# Patient Record
Sex: Male | Born: 1960 | Race: White | Hispanic: No | Marital: Married | Smoking: Former smoker
Health system: Southern US, Community
[De-identification: ages and names within clinical notes are randomized; demographics above are authoritative.]

## PROBLEM LIST (undated history)

## (undated) DIAGNOSIS — C679 Malignant neoplasm of bladder, unspecified: Secondary | ICD-10-CM

## (undated) DIAGNOSIS — I2510 Atherosclerotic heart disease of native coronary artery without angina pectoris: Secondary | ICD-10-CM

## (undated) DIAGNOSIS — R011 Cardiac murmur, unspecified: Secondary | ICD-10-CM

## (undated) DIAGNOSIS — B019 Varicella without complication: Secondary | ICD-10-CM

## (undated) DIAGNOSIS — E669 Obesity, unspecified: Secondary | ICD-10-CM

## (undated) DIAGNOSIS — R7303 Prediabetes: Secondary | ICD-10-CM

## (undated) DIAGNOSIS — G4733 Obstructive sleep apnea (adult) (pediatric): Secondary | ICD-10-CM

## (undated) DIAGNOSIS — G4730 Sleep apnea, unspecified: Secondary | ICD-10-CM

## (undated) DIAGNOSIS — I10 Essential (primary) hypertension: Secondary | ICD-10-CM

## (undated) DIAGNOSIS — I509 Heart failure, unspecified: Secondary | ICD-10-CM

## (undated) DIAGNOSIS — T7840XA Allergy, unspecified, initial encounter: Secondary | ICD-10-CM

## (undated) DIAGNOSIS — C801 Malignant (primary) neoplasm, unspecified: Secondary | ICD-10-CM

## (undated) DIAGNOSIS — M1990 Unspecified osteoarthritis, unspecified site: Secondary | ICD-10-CM

## (undated) DIAGNOSIS — I252 Old myocardial infarction: Secondary | ICD-10-CM

## (undated) DIAGNOSIS — E785 Hyperlipidemia, unspecified: Secondary | ICD-10-CM

## (undated) HISTORY — DX: Obesity, unspecified: E66.9

## (undated) HISTORY — DX: Atherosclerotic heart disease of native coronary artery without angina pectoris: I25.10

## (undated) HISTORY — DX: Heart failure, unspecified: I50.9

## (undated) HISTORY — PX: COLONOSCOPY: SHX174

## (undated) HISTORY — DX: Malignant neoplasm of bladder, unspecified: C67.9

## (undated) HISTORY — DX: Allergy, unspecified, initial encounter: T78.40XA

## (undated) HISTORY — DX: Old myocardial infarction: I25.2

## (undated) HISTORY — DX: Varicella without complication: B01.9

## (undated) HISTORY — PX: NASAL SINUS SURGERY: SHX719

## (undated) HISTORY — DX: Hyperlipidemia, unspecified: E78.5

## (undated) HISTORY — DX: Sleep apnea, unspecified: G47.30

## (undated) HISTORY — DX: Unspecified osteoarthritis, unspecified site: M19.90

## (undated) HISTORY — DX: Cardiac murmur, unspecified: R01.1

## (undated) HISTORY — PX: VASECTOMY: SHX75

## (undated) HISTORY — DX: Obstructive sleep apnea (adult) (pediatric): G47.33

---

## 1998-07-14 HISTORY — PX: KNEE SURGERY: SHX244

## 2012-07-14 HISTORY — PX: EYE SURGERY: SHX253

## 2013-07-14 HISTORY — PX: CARDIAC CATHETERIZATION: SHX172

## 2014-01-25 DIAGNOSIS — I2510 Atherosclerotic heart disease of native coronary artery without angina pectoris: Secondary | ICD-10-CM

## 2014-01-25 HISTORY — PX: CORONARY ANGIOPLASTY: SHX604

## 2014-01-25 HISTORY — DX: Atherosclerotic heart disease of native coronary artery without angina pectoris: I25.10

## 2014-03-13 ENCOUNTER — Encounter: Payer: Self-pay | Admitting: Cardiology

## 2014-03-21 ENCOUNTER — Encounter: Payer: Self-pay | Admitting: Cardiology

## 2015-10-19 ENCOUNTER — Ambulatory Visit (INDEPENDENT_AMBULATORY_CARE_PROVIDER_SITE_OTHER): Payer: 59 | Admitting: Cardiology

## 2015-10-19 ENCOUNTER — Encounter: Payer: Self-pay | Admitting: Cardiology

## 2015-10-19 VITALS — BP 126/64 | Ht 71.0 in | Wt 264.0 lb

## 2015-10-19 DIAGNOSIS — E785 Hyperlipidemia, unspecified: Secondary | ICD-10-CM | POA: Diagnosis not present

## 2015-10-19 DIAGNOSIS — I2510 Atherosclerotic heart disease of native coronary artery without angina pectoris: Secondary | ICD-10-CM

## 2015-10-19 DIAGNOSIS — G4733 Obstructive sleep apnea (adult) (pediatric): Secondary | ICD-10-CM | POA: Diagnosis not present

## 2015-10-19 DIAGNOSIS — I10 Essential (primary) hypertension: Secondary | ICD-10-CM | POA: Diagnosis not present

## 2015-10-19 DIAGNOSIS — E669 Obesity, unspecified: Secondary | ICD-10-CM

## 2015-10-19 HISTORY — DX: Obesity, unspecified: E66.9

## 2015-10-19 HISTORY — DX: Hyperlipidemia, unspecified: E78.5

## 2015-10-19 HISTORY — DX: Obstructive sleep apnea (adult) (pediatric): G47.33

## 2015-10-19 MED ORDER — TICAGRELOR 60 MG PO TABS: 60.0000 mg | ORAL_TABLET | Freq: Two times a day (BID) | ORAL | Status: DC

## 2015-10-19 NOTE — Patient Instructions (Signed)
Medication Instructions:  1) DECREASE your Brilinta to 60mg  twice daily  Labwork: Your physician recommends that you return for lab work in: 1 week (Fasting lipid, liver and bmet)   Testing/Procedures: None  Follow-Up: Your physician wants you to follow-up in: 1 year with Dr. Radford Pax. You will receive a reminder letter in the mail two months in advance. If you don't receive a letter, please call our office to schedule the follow-up appointment.   Any Other Special Instructions Will Be Listed Below (If Applicable).     If you need a refill on your cardiac medications before your next appointment, please call your pharmacy.

## 2015-10-19 NOTE — Progress Notes (Addendum)
Cardiology Office Note    Date:  10/26/2015   ID:  Melburn Hake, DOB February 17, 1961, MRN [id number]  PCP:  No PCP Per Patient  Cardiologist:  Sueanne Margarita, MD   Chief Complaint  Patient presents with  . Coronary Artery Disease  . Hypertension  . Hyperlipidemia    History of Present Illness:  Peter Oconnor is a 54 y.o. male with a history of ASCAD with anterior MI in 01/2014.  Cath revealed 30-40% RCA, occluded LCX, 90% LAD s/p LAD and LCx.  EF was 50% with inferior HK. Echo 01/2014 showed EF 60-65% with LVH, mid TR and mid to moderate AVSC and lateral wall infarct.   Stress echo 06/2014 showed normal LVF with EF 60-65% with no ischemia.  He also has a history of tobacco abuse, HTN, obesity, dyslipidemia and OSA.  He moved to [place] from [place] last May.  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  He denies any chest pain, SOB, DOE (except with running up stairs), LE edema, PND, orthopnea, palpitations or syncope.  He occasionally has some dizziness with balance issues.  He is on CPAP which he tolerates well.  He has been on it since 2010.  He tolerates his device and feels the pressure is adequate.  He uses a full face mask which he tolerates well.  He feels rested in the am and has no daytime sleepiness.      Past Medical History  Diagnosis Date  . MI, old   . Benign essential HTN 10/19/2015  . OSA (obstructive sleep apnea) 10/19/2015  . Obesity (BMI 30-39.9) 10/19/2015  . CAD (coronary artery disease), native coronary artery 01/25/2014    S/p anterior MI with 90% LAD and occluded LCx s/p PCI of LAD and LCx with residual 30-40% stenosis of the RCA and normal LVF with lateral AK.    Marland Kitchen Hyperlipidemia with target LDL less than 70 10/19/2015    Past Surgical History  Procedure Laterality Date  . Cardiac catheterization    . Coronary angioplasty  01/25/2014    PCI of LCx and LAD    Current  Medications: Outpatient Prescriptions Prior to Visit  Medication Sig Dispense Refill  . aspirin 81 MG tablet Take 81 mg by mouth daily.    Marland Kitchen lisinopril (PRINIVIL,ZESTRIL) 5 MG tablet Take 5 mg by mouth daily.    . metoprolol tartrate (LOPRESSOR) 25 MG tablet Take 25 mg by mouth 2 (two) times daily.    . rosuvastatin (CRESTOR) 40 MG tablet Take 40 mg by mouth daily.    . ticagrelor (BRILINTA) 90 MG TABS tablet Take by mouth 2 (two) times daily.     No facility-administered medications prior to visit.     Allergies:   Penicillins   Social History   Social History  . Marital Status: Married    Spouse Name: N/A  . Number of Children: N/A  . Years of Education: N/A   Social History Main Topics  . Smoking status: Former Research scientist (life sciences)  . Smokeless tobacco: None  . Alcohol Use: Yes  . Drug Use: No  . Sexual Activity: Not Asked   Other Topics Concern  . None   Social History Narrative     Family History:  The patient's family history includes Heart attack in his maternal grandfather; Heart disease in his maternal grandfather; Leukemia in his father.  ROS:   Please see the history of present illness.    ROS All other systems reviewed and are negative.   PHYSICAL EXAM:   VS:  BP 126/64 mmHg  Ht 5\' 11"  (1.803 m)  Wt 264 lb (119.75 kg)  BMI 36.84 kg/m2   GEN: Well nourished, well developed, in no acute distress HEENT: normal Neck: no JVD, carotid bruits, or masses Cardiac: RRR; no murmurs, rubs, or gallops,no edema.  Intact distal pulses bilaterally.  Respiratory:  clear to auscultation bilaterally, normal work of breathing GI: soft, nontender, nondistended, + BS MS: no deformity or atrophy Skin: warm and dry, no rash Neuro:  Alert and Oriented x 3, Strength and sensation are intact Psych: euthymic mood, full affect  Wt Readings from Last 3 Encounters:  10/19/15 264 lb (119.75 kg)      Studies/Labs Reviewed:   EKG:  EKG is ordered today.  The ekg ordered today  demonstrates sinus bradycardia at 57 bpm with no ST changes.  Recent Labs: 10/25/2015: ALT 31; BUN 13; Creat 0.97; Potassium 4.2; Sodium 140   Lipid Panel    Component Value Date/Time   CHOL 128 10/25/2015 0731   TRIG 119 10/25/2015 0731   HDL 41 10/25/2015 0731   CHOLHDL 3.1 10/25/2015 0731   VLDL 24 10/25/2015 0731   LDLCALC 63 10/25/2015 0731    Additional studies/ records that were reviewed today include:  Office notes from PCP and prior cardiologist, Cath report, echo and stress echo reports.    ASSESSMENT:    1. Coronary artery disease involving native coronary artery of native heart without angina pectoris   2. Hyperlipidemia with target LDL less than 70   3. Benign essential HTN   4. OSA (obstructive sleep apnea)   5. Obesity (BMI 30-39.9)      PLAN:  In order of problems listed above:  1. ASCAD with remote anterior MI and PCI of the LCx and LAD 01/2014.  He has no anginal symptoms.  Continue ASA/BB/statin.  Decrease Brilinta to 60mg  BID. 2. Hyperlipidemia - continue statin.  Check FLP and ALT. 3. HTN with controlled BP - continue BB and ACE I 4. OSA on CPAP and tolerating well.  Continue CPAP and I will set him up with [facility] and get a download.   5. Obesity - he is quiting his job and plans on getting into an exercise routine.    Followup with me in 1 year    Medication Adjustments/Labs and Tests Ordered: Current medicines are reviewed at length with the patient today.  Concerns regarding medicines are outlined above.  Medication changes, Labs and Tests ordered today are listed in the Patient Instructions below. Patient Instructions  Medication Instructions:  1) DECREASE your Brilinta to 60mg  twice daily  Labwork: Your physician recommends that you return for lab work in: 1 week (Fasting lipid, liver and bmet)   Testing/Procedures: None  Follow-Up: Your physician wants you to follow-up in: 1 year with Dr. Radford Pax. You will receive a reminder letter in the  mail two months in advance. If you don't receive a letter, please call our office to schedule the follow-up appointment.   Any Other Special Instructions Will Be Listed Below (If Applicable).     If you need a refill on your cardiac medications before your next appointment, please call your pharmacy.       Lurena Nida, MD  10/26/2015 1:16 PM    [place] [place], [place]  [address] Phone: [phone]; Fax: [phone]

## 2015-10-25 ENCOUNTER — Other Ambulatory Visit (INDEPENDENT_AMBULATORY_CARE_PROVIDER_SITE_OTHER): Payer: 59 | Admitting: *Deleted

## 2015-10-25 ENCOUNTER — Other Ambulatory Visit: Payer: Self-pay | Admitting: *Deleted

## 2015-10-25 DIAGNOSIS — E785 Hyperlipidemia, unspecified: Secondary | ICD-10-CM

## 2015-10-25 DIAGNOSIS — G4733 Obstructive sleep apnea (adult) (pediatric): Secondary | ICD-10-CM

## 2015-10-25 DIAGNOSIS — I2510 Atherosclerotic heart disease of native coronary artery without angina pectoris: Secondary | ICD-10-CM | POA: Diagnosis not present

## 2015-10-25 DIAGNOSIS — E669 Obesity, unspecified: Secondary | ICD-10-CM

## 2015-10-25 LAB — LIPID PANEL
CHOL/HDL RATIO: 3.1 ratio (ref <=5.0)
CHOLESTEROL: 128 mg/dL (ref 125–200)
HDL: 41 mg/dL (ref >=40)
LDL Cholesterol: 63 mg/dL (ref <130)
Triglycerides: 119 mg/dL (ref <150)
VLDL: 24 mg/dL (ref <30)

## 2015-10-25 LAB — HEPATIC FUNCTION PANEL
ALBUMIN: 4.5 g/dL (ref 3.6–5.1)
ALK PHOS: 39 U/L — AB (ref 40–115)
ALT: 31 U/L (ref 9–46)
AST: 23 U/L (ref 10–35)
BILIRUBIN TOTAL: 0.9 mg/dL (ref 0.2–1.2)
Bilirubin, Direct: 0.2 mg/dL (ref <=0.2)
Indirect Bilirubin: 0.7 mg/dL (ref 0.2–1.2)
TOTAL PROTEIN: 7 g/dL (ref 6.1–8.1)

## 2015-10-25 LAB — BASIC METABOLIC PANEL
BUN: 13 mg/dL (ref 7–25)
CALCIUM: 9.4 mg/dL (ref 8.6–10.3)
CO2: 24 mmol/L (ref 20–31)
CREATININE: 0.97 mg/dL (ref 0.70–1.33)
Chloride: 107 mmol/L (ref 98–110)
GLUCOSE: 102 mg/dL — AB (ref 65–99)
Potassium: 4.2 mmol/L (ref 3.5–5.3)
SODIUM: 140 mmol/L (ref 135–146)

## 2015-10-26 ENCOUNTER — Encounter: Payer: Self-pay | Admitting: Cardiology

## 2015-10-26 ENCOUNTER — Telehealth: Payer: Self-pay | Admitting: *Deleted

## 2015-10-26 MED ORDER — LISINOPRIL 5 MG PO TABS: 5.0000 mg | ORAL_TABLET | Freq: Every day | ORAL | Status: DC

## 2015-10-26 MED ORDER — METOPROLOL TARTRATE 25 MG PO TABS: 25.0000 mg | ORAL_TABLET | Freq: Two times a day (BID) | ORAL | Status: DC

## 2015-10-26 NOTE — Telephone Encounter (Signed)
Advised patient and new Rx's sent to [facility]

## 2015-10-26 NOTE — Telephone Encounter (Signed)
Patient stated that for the last 7-9 months he has been taking his Metoprolol Tart 25 mg once a day and his Lisinopril 5 mg twice a day because that is what his bottles say. Patient would like Dr Radford Pax to give recommendations on what she thinks he should be taking Will forward to her for review   Advised patient of lab results

## 2015-10-26 NOTE — Telephone Encounter (Signed)
-----   Message from Sueanne Margarita, MD sent at 10/25/2015 10:30 PM EDT ----- Please let patient know that labs were normal.  Continue current medical therapy.

## 2015-10-26 NOTE — Telephone Encounter (Signed)
Please have him verify what the bottles say - he should be on metoprolol BID and ACE I daily

## 2016-01-18 ENCOUNTER — Other Ambulatory Visit: Payer: Self-pay | Admitting: *Deleted

## 2016-01-18 MED ORDER — ROSUVASTATIN CALCIUM 40 MG PO TABS: 40.0000 mg | ORAL_TABLET | Freq: Every day | ORAL | Status: DC

## 2016-04-01 ENCOUNTER — Encounter: Payer: Self-pay | Admitting: Cardiology

## 2016-04-14 ENCOUNTER — Encounter: Payer: Self-pay | Admitting: Cardiology

## 2016-04-21 NOTE — Telephone Encounter (Signed)
Follow up  Pt c/o medication issue:  1. Name of Medication: Crestor/Rosuvastatin  2. How are you currently taking this medication (dosage and times per day)? 40 mg tablet once daily  3. Are you having a reaction (difficulty breathing--STAT)? No  4. What is your medication issue? Pt voiced wanting to know if he needs generic/brand medication.   Pt voiced wanting generic due to cost of medication.   Pt voiced message was also sent via mychart msg but no response.   Pt voiced he receives a 90 day supply and wants to make sure generic is listed for reorder instead of brand medication.  Pt voiced waiting for someone to call [facility] for generic prescription that was okayed by MD-Turner  Please f/u with pt.

## 2016-04-22 ENCOUNTER — Other Ambulatory Visit: Payer: Self-pay | Admitting: *Deleted

## 2016-04-22 MED ORDER — ROSUVASTATIN CALCIUM 40 MG PO TABS: 40.0000 mg | ORAL_TABLET | Freq: Every day | ORAL | 1 refills | Status: DC

## 2016-09-19 ENCOUNTER — Encounter: Payer: Self-pay | Admitting: Cardiology

## 2016-10-08 ENCOUNTER — Ambulatory Visit (INDEPENDENT_AMBULATORY_CARE_PROVIDER_SITE_OTHER): Payer: 59 | Admitting: Cardiology

## 2016-10-08 ENCOUNTER — Encounter: Payer: Self-pay | Admitting: Cardiology

## 2016-10-08 VITALS — BP 122/60 | HR 61 | Ht 71.0 in | Wt 266.6 lb

## 2016-10-08 DIAGNOSIS — I2510 Atherosclerotic heart disease of native coronary artery without angina pectoris: Secondary | ICD-10-CM

## 2016-10-08 DIAGNOSIS — E785 Hyperlipidemia, unspecified: Secondary | ICD-10-CM

## 2016-10-08 MED ORDER — LISINOPRIL 5 MG PO TABS: 5.0000 mg | ORAL_TABLET | Freq: Every day | ORAL | 3 refills | Status: DC

## 2016-10-08 MED ORDER — TICAGRELOR 60 MG PO TABS: 60.0000 mg | ORAL_TABLET | Freq: Two times a day (BID) | ORAL | 3 refills | Status: DC

## 2016-10-08 MED ORDER — NITROGLYCERIN 0.4 MG SL SUBL: 0.4000 mg | SUBLINGUAL_TABLET | SUBLINGUAL | 3 refills | Status: DC | PRN

## 2016-10-08 MED ORDER — ROSUVASTATIN CALCIUM 40 MG PO TABS: 40.0000 mg | ORAL_TABLET | Freq: Every day | ORAL | 3 refills | Status: DC

## 2016-10-08 MED ORDER — METOPROLOL TARTRATE 25 MG PO TABS
25.0000 mg | ORAL_TABLET | Freq: Two times a day (BID) | ORAL | 3 refills | Status: DC
Start: 2016-10-08 — End: 2017-10-12

## 2016-10-08 NOTE — Progress Notes (Signed)
10/08/2016 Peter Oconnor   [birth year]  [id number]  Primary Physician No PCP Per Patient Primary Cardiologist: Dr. Radford Pax    Reason for Visit/CC: F/u for CAD  HPI:  The patient is a 56 y/o male, followed by Dr. Radford Pax, with a h/o CAD s/p anterior MI in 01/2014 while living in [place].  Cath revealed 30-40% RCA, occluded LCX, and a 90% LAD. He underwent PCI + stenting of his LCx and LAD.  Echo 01/2014 showed EF 60-65% with LVH, mid TR and mild to moderate AVSC and lateral wall infarct.  He also has a h/o tobacco abuse, HTN, obesity and OSA.   He presents to clinic for 1 year f/u. He has done well since his last OV. He retired from his job, and reports that he is less stressed. He is more physically active. He walks ~5 miles a day. Also exercises on the elliptical. He denies anginal symptoms. No exertional chest pain or dyspnea. He reports full medication compliance. He quit smoking cigars. No other tobacco use.    Current Meds  Medication Sig  . aspirin 81 MG tablet Take 81 mg by mouth daily.  . Coenzyme Q10 (CO Q-10) 200 MG CAPS Take 1 capsule by mouth daily.  Marland Kitchen lisinopril (PRINIVIL,ZESTRIL) 5 MG tablet Take 1 tablet (5 mg total) by mouth daily.  . metoprolol tartrate (LOPRESSOR) 25 MG tablet Take 1 tablet (25 mg total) by mouth 2 (two) times daily.  . rosuvastatin (CRESTOR) 40 MG tablet Take 1 tablet (40 mg total) by mouth daily.  . ticagrelor (BRILINTA) 60 MG TABS tablet Take 1 tablet (60 mg total) by mouth 2 (two) times daily.  . [DISCONTINUED] lisinopril (PRINIVIL,ZESTRIL) 5 MG tablet Take 1 tablet (5 mg total) by mouth daily.  . [DISCONTINUED] metoprolol tartrate (LOPRESSOR) 25 MG tablet Take 1 tablet (25 mg total) by mouth 2 (two) times daily.  . [DISCONTINUED] rosuvastatin (CRESTOR) 40 MG tablet Take 1 tablet (40 mg total) by mouth daily.  . [DISCONTINUED] ticagrelor (BRILINTA) 60 MG TABS tablet Take 1 tablet (60 mg total) by mouth 2 (two) times daily.   Allergies  Allergen  Reactions  . Penicillins Rash    Also itching   Past Medical History:  Diagnosis Date  . Benign essential HTN 10/19/2015  . 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    Marland Kitchen Hyperlipidemia with target LDL less than 70 10/19/2015  . MI, old   . Obesity (BMI 30-39.9) 10/19/2015  . OSA (obstructive sleep apnea) 10/19/2015   Family History  Problem Relation Age of Onset  . Leukemia Father   . Heart attack Maternal Grandfather   . Heart disease Maternal Grandfather    Past Surgical History:  Procedure Laterality Date  . CARDIAC CATHETERIZATION    . CORONARY ANGIOPLASTY  01/25/2014   PCI of LCx and LAD   Social History   Social History  . Marital status: Married    Spouse name: N/A  . Number of children: N/A  . Years of education: N/A   Occupational History  . Not on file.   Social History Main Topics  . Smoking status: Former Research scientist (life sciences)  . Smokeless tobacco: Never Used  . Alcohol use Yes  . Drug use: No  . Sexual activity: Not on file   Other Topics Concern  . Not  on file   Social History Narrative  . No narrative on file     Review of Systems: General: negative for chills, fever, night sweats or weight changes.  Cardiovascular: negative for chest pain, dyspnea on exertion, edema, orthopnea, palpitations, paroxysmal nocturnal dyspnea or shortness of breath Dermatological: negative for rash Respiratory: negative for cough or wheezing Urologic: negative for hematuria Abdominal: negative for nausea, vomiting, diarrhea, bright red blood per rectum, melena, or hematemesis Neurologic: negative for visual changes, syncope, or dizziness All other systems reviewed and are otherwise negative except as noted above.   Physical Exam:  Blood pressure 122/60, pulse 61, height 5\' 11"  (1.803 m), weight 266 lb 9.6 oz (120.9 kg).    General appearance: alert, cooperative, no distress and moderately obese Neck: no carotid bruit and no JVD Lungs: clear to auscultation bilaterally Heart: regular rate and rhythm, S1, S2 normal, no murmur, click, rub or gallop Extremities: extremities normal, atraumatic, no cyanosis or edema Pulses: 2+ and symmetric Skin: Skin color, texture, turgor normal. No rashes or lesions Neurologic: Grossly normal  EKG NSR, slight TWI in inferior leads III and AVF  ASSESSMENT AND PLAN:   1. CAD: s/p MI in [place] 01/2004, s/p PCI + stenting to LCx and LAD. 30-40% RCA stenosis noted at that time. He has remained stable w/o recurrent angina. He exercises regularly, walks ~5 miles a day w/o exertional CP or dyspnea. Continue medical therapy for secondary prevention. Continue ASA, Brilinta (60 mg BID), Crestor, metoprolol and lisinopril.   2. HTN: BP is well controlled on current regimen. Check CMP to monitor renal function and K.   3. HLD: last lipid panel 1 year ago showed well controlled LDL at 63 mg/dL. We will repeat a FLP and CMP to check liver enzymes.   4. OSA: he reports full compliance with CPAP nightly  PLAN  Continue current regimen. f/u in 1 year with Dr. Radford Pax or sooner if any cardiac symptoms.   Brittainy Simmons PA-C 10/08/2016 4:00 PM

## 2016-10-08 NOTE — Patient Instructions (Addendum)
Medication Instructions:   Your physician recommends that you continue on your current medications as directed. Please refer to the Current Medication list given to you today.   If you need a refill on your cardiac medications before your next appointment, please call your pharmacy.  Labwork:  FASTING CMET AND LIPID TOMORROW    Testing/Procedures: NONE ORDERED  TODAY'    Follow-Up: Your physician wants you to follow-up in: [place] will receive a reminder letter in the mail two months in advance. If you don't receive a letter, please call our office to schedule the follow-up appointment.     Any Other Special Instructions Will Be Listed Below (If Applicable).  Nitroglycerin sublingual tablets What is this medicine? NITROGLYCERIN (nye troe GLI ser in) is a type of vasodilator. It relaxes blood vessels, increasing the blood and oxygen supply to your heart. This medicine is used to relieve chest pain caused by angina. It is also used to prevent chest pain before activities like climbing stairs, going outdoors in cold weather, or sexual activity. This medicine may be used for other purposes; ask your health care provider or pharmacist if you have questions. COMMON BRAND NAME(S): Nitroquick, Nitrostat, Nitrotab  What should I tell my health care provider before I take this medicine? They need to know if you have any of these conditions: -anemia -head injury, recent stroke, or bleeding in the brain -liver disease -previous heart attack -an unusual or allergic reaction to nitroglycerin, other medicines, foods, dyes, or preservatives -pregnant or trying to get pregnant -breast-feeding  How should I use this medicine? Take this medicine by mouth as needed. At the first sign of an angina attack (chest pain or tightness) place one tablet under your tongue. You can also take this medicine 5 to 10 minutes before an event likely to produce chest pain. Follow the directions on  the prescription label. Let the tablet dissolve under the tongue. Do not swallow whole. Replace the dose if you accidentally swallow it. It will help if your mouth is not dry. Saliva around the tablet will help it to dissolve more quickly. Do not eat or drink, smoke or chew tobacco while a tablet is dissolving. If you are not better within 5 minutes after taking ONE dose of nitroglycerin, call 9-1-1 immediately to seek emergency medical care. Do not take more than 3 nitroglycerin tablets over 15 minutes. If you take this medicine often to relieve symptoms of angina, your doctor or health care professional may provide you with different instructions to manage your symptoms. If symptoms do not go away after following these instructions, it is important to call 9-1-1 immediately. Do not take more than 3 nitroglycerin tablets over 15 minutes. Talk to your pediatrician regarding the use of this medicine in children. Special care may be needed. Overdosage: If you think you have taken too much of this medicine contact a poison control center or emergency room at once.  NOTE: This medicine is only for you. Do not share this medicine with others.  What if I miss a dose? This does not apply. This medicine is only used as needed.   What may interact with this medicine? Do not take this medicine with any of the following medications: -certain migraine medicines like ergotamine and dihydroergotamine (DHE) -medicines used to treat erectile dysfunction like sildenafil, tadalafil, and vardenafil -riociguat This medicine may also interact with the following medications: -alteplase -aspirin -heparin -medicines for high blood pressure -medicines for mental depression -other  medicines used to treat angina -phenothiazines like chlorpromazine, mesoridazine, prochlorperazine, thioridazine This list may not describe all possible interactions. Give your health care provider a list of all the medicines, herbs,  non-prescription drugs, or dietary supplements you use. Also tell them if you smoke, drink alcohol, or use illegal drugs. Some items may interact with your medicine.   What should I watch for while using this medicine? Tell your doctor or health care professional if you feel your medicine is no longer working. Keep this medicine with you at all times. Sit or lie down when you take your medicine to prevent falling if you feel dizzy or faint after using it. Try to remain calm. This will help you to feel better faster. If you feel dizzy, take several deep breaths and lie down with your feet propped up, or bend forward with your head resting between your knees. You may get drowsy or dizzy. Do not drive, use machinery, or do anything that needs mental alertness until you know how this drug affects you. Do not stand or sit up quickly, especially if you are an older patient. This reduces the risk of dizzy or fainting spells. Alcohol can make you more drowsy and dizzy. Avoid alcoholic drinks. Do not treat yourself for coughs, colds, or pain while you are taking this medicine without asking your doctor or health care professional for advice. Some ingredients may increase your blood pressure.  What side effects may I notice from receiving this medicine? Side effects that you should report to your doctor or health care professional as soon as possible: -blurred vision -dry mouth -skin rash -sweating -the feeling of extreme pressure in the head -unusually weak or tired Side effects that usually do not require medical attention (report to your doctor or health care professional if they continue or are bothersome): -flushing of the face or neck -headache -irregular heartbeat, palpitations -nausea, vomiting This list may not describe all possible side effects. Call your doctor for medical advice about side effects. You may report side effects to FDA at 1-800-FDA-1088.  Where should I keep my medicine? Keep  out of the reach of children. Store at room temperature between 20 and 25 degrees C (68 and 77 degrees F). Store in [employer]. Protect from light and moisture. Keep tightly closed. Throw away any unused medicine after the expiration date. NOTE: This sheet is a summary. It may not cover all possible information. If you have questions about this medicine, talk to your doctor, pharmacist, or health care provider.  2018 Elsevier/Gold Standard (2013-04-28 17:57:36)

## 2016-10-15 ENCOUNTER — Encounter: Payer: Self-pay | Admitting: Cardiology

## 2016-10-20 ENCOUNTER — Other Ambulatory Visit: Payer: 59 | Admitting: *Deleted

## 2016-10-20 DIAGNOSIS — E785 Hyperlipidemia, unspecified: Secondary | ICD-10-CM

## 2016-10-20 DIAGNOSIS — I2510 Atherosclerotic heart disease of native coronary artery without angina pectoris: Secondary | ICD-10-CM

## 2016-10-20 LAB — COMPREHENSIVE METABOLIC PANEL
A/G RATIO: 1.9 (ref 1.2–2.2)
ALT: 30 IU/L (ref 0–44)
AST: 24 IU/L (ref 0–40)
Albumin: 4.5 g/dL (ref 3.5–5.5)
Alkaline Phosphatase: 47 IU/L (ref 39–117)
BILIRUBIN TOTAL: 0.4 mg/dL (ref 0.0–1.2)
BUN / CREAT RATIO: 11 (ref 9–20)
BUN: 10 mg/dL (ref 6–24)
CALCIUM: 9.6 mg/dL (ref 8.7–10.2)
CHLORIDE: 102 mmol/L (ref 96–106)
CO2: 26 mmol/L (ref 18–29)
Creatinine, Ser: 0.91 mg/dL (ref 0.76–1.27)
GFR calc Af Amer: 111 mL/min/{1.73_m2} (ref >59)
GFR, EST NON AFRICAN AMERICAN: 96 mL/min/{1.73_m2} (ref >59)
GLOBULIN, TOTAL: 2.4 g/dL (ref 1.5–4.5)
Glucose: 111 mg/dL — ABNORMAL HIGH (ref 65–99)
Potassium: 4.9 mmol/L (ref 3.5–5.2)
Sodium: 143 mmol/L (ref 134–144)
TOTAL PROTEIN: 6.9 g/dL (ref 6.0–8.5)

## 2016-10-20 LAB — LIPID PANEL
CHOL/HDL RATIO: 2.7 ratio (ref 0.0–5.0)
CHOLESTEROL TOTAL: 161 mg/dL (ref 100–199)
HDL: 59 mg/dL (ref >39)
LDL CALC: 88 mg/dL (ref 0–99)
TRIGLYCERIDES: 71 mg/dL (ref 0–149)
VLDL Cholesterol Cal: 14 mg/dL (ref 5–40)

## 2016-10-21 ENCOUNTER — Telehealth: Payer: Self-pay | Admitting: *Deleted

## 2016-10-21 DIAGNOSIS — Z79899 Other long term (current) drug therapy: Secondary | ICD-10-CM

## 2016-10-21 NOTE — Telephone Encounter (Signed)
-----   Message from Consuelo Pandy, [place] sent at 10/21/2016 11:28 AM EDT ----- Kidney function, liver function and electrolytes are normal. His LDL is not at goal. LDL is the bad cholesterol that can lead to plaque buildup in arteries. Given his history of coronary artery disease, we want this to be <70. LDL is 88. Check compliance with Crestor. Make sure patient is taking this every night. If not, he will need to improve compliance. If he has been taking this regularly, then we will need to add Zetia, 10 mg, to be taken in addition to Crestor. Recheck FLP and HFTs in 6 weeks.

## 2016-12-01 ENCOUNTER — Other Ambulatory Visit: Payer: 59

## 2016-12-01 DIAGNOSIS — Z79899 Other long term (current) drug therapy: Secondary | ICD-10-CM

## 2016-12-01 LAB — HEPATIC FUNCTION PANEL
ALBUMIN: 4.3 g/dL (ref 3.5–5.5)
ALT: 18 IU/L (ref 0–44)
AST: 20 IU/L (ref 0–40)
Alkaline Phosphatase: 44 IU/L (ref 39–117)
BILIRUBIN, DIRECT: 0.16 mg/dL (ref 0.00–0.40)
Bilirubin Total: 0.6 mg/dL (ref 0.0–1.2)
Total Protein: 6.6 g/dL (ref 6.0–8.5)

## 2016-12-01 LAB — LIPID PANEL
CHOL/HDL RATIO: 3.3 ratio (ref 0.0–5.0)
Cholesterol, Total: 146 mg/dL (ref 100–199)
HDL: 44 mg/dL (ref >39)
LDL Calculated: 73 mg/dL (ref 0–99)
Triglycerides: 143 mg/dL (ref 0–149)
VLDL CHOLESTEROL CAL: 29 mg/dL (ref 5–40)

## 2017-05-06 ENCOUNTER — Encounter: Payer: Self-pay | Admitting: Cardiology

## 2017-05-24 ENCOUNTER — Encounter: Payer: Self-pay | Admitting: Cardiology

## 2017-06-23 ENCOUNTER — Encounter: Payer: Self-pay | Admitting: Cardiology

## 2017-07-23 DIAGNOSIS — R9431 Abnormal electrocardiogram [ECG] [EKG]: Secondary | ICD-10-CM | POA: Diagnosis not present

## 2017-07-23 DIAGNOSIS — R079 Chest pain, unspecified: Secondary | ICD-10-CM | POA: Diagnosis not present

## 2017-07-23 DIAGNOSIS — R0602 Shortness of breath: Secondary | ICD-10-CM | POA: Diagnosis not present

## 2017-07-23 DIAGNOSIS — Z880 Allergy status to penicillin: Secondary | ICD-10-CM | POA: Diagnosis not present

## 2017-07-23 DIAGNOSIS — I252 Old myocardial infarction: Secondary | ICD-10-CM | POA: Diagnosis not present

## 2017-07-23 DIAGNOSIS — Z87891 Personal history of nicotine dependence: Secondary | ICD-10-CM | POA: Diagnosis not present

## 2017-07-23 DIAGNOSIS — I2510 Atherosclerotic heart disease of native coronary artery without angina pectoris: Secondary | ICD-10-CM | POA: Diagnosis not present

## 2017-08-25 ENCOUNTER — Telehealth: Payer: Self-pay | Admitting: Cardiology

## 2017-08-25 NOTE — Telephone Encounter (Signed)
Spoke with patient and recommended that he come to his OV fasting and if Dr. Radford Pax needs any labs drawn, that they could be done that day. Patient verbalized understanding and thanked me for the call.

## 2017-08-25 NOTE — Telephone Encounter (Signed)
Per pt call he would like to have his LAB work done days before please and get results with his visit.

## 2017-09-18 ENCOUNTER — Other Ambulatory Visit: Payer: Self-pay | Admitting: Cardiology

## 2017-09-28 ENCOUNTER — Ambulatory Visit (INDEPENDENT_AMBULATORY_CARE_PROVIDER_SITE_OTHER): Payer: BLUE CROSS/BLUE SHIELD | Admitting: Family Medicine

## 2017-09-28 ENCOUNTER — Encounter: Payer: Self-pay | Admitting: Family Medicine

## 2017-09-28 ENCOUNTER — Ambulatory Visit (INDEPENDENT_AMBULATORY_CARE_PROVIDER_SITE_OTHER): Payer: BLUE CROSS/BLUE SHIELD

## 2017-09-28 VITALS — BP 128/78 | HR 72 | Ht 71.0 in | Wt 274.5 lb

## 2017-09-28 DIAGNOSIS — M25462 Effusion, left knee: Secondary | ICD-10-CM | POA: Diagnosis not present

## 2017-09-28 DIAGNOSIS — E785 Hyperlipidemia, unspecified: Secondary | ICD-10-CM

## 2017-09-28 DIAGNOSIS — E669 Obesity, unspecified: Secondary | ICD-10-CM | POA: Diagnosis not present

## 2017-09-28 DIAGNOSIS — M25562 Pain in left knee: Secondary | ICD-10-CM

## 2017-09-28 DIAGNOSIS — M1712 Unilateral primary osteoarthritis, left knee: Secondary | ICD-10-CM | POA: Diagnosis not present

## 2017-09-28 DIAGNOSIS — I2510 Atherosclerotic heart disease of native coronary artery without angina pectoris: Secondary | ICD-10-CM

## 2017-09-28 DIAGNOSIS — M170 Bilateral primary osteoarthritis of knee: Secondary | ICD-10-CM | POA: Diagnosis not present

## 2017-09-28 NOTE — Patient Instructions (Addendum)
Osteoarthritis Osteoarthritis is a type of arthritis that affects tissue that covers the ends of bones in joints (cartilage). Cartilage acts as a cushion between the bones and helps them move smoothly. Osteoarthritis results when cartilage in the joints gets worn down. Osteoarthritis is sometimes called "wear and tear" arthritis. Osteoarthritis is the most common form of arthritis. It often occurs in older people. It is a condition that gets worse over time (a progressive condition). Joints that are most often affected by this condition are in:  Fingers.  Toes.  Hips.  Knees.  Spine, including neck and lower back.  What are the causes? This condition is caused by age-related wearing down of cartilage that covers the ends of bones. What increases the risk? The following factors may make you more likely to develop this condition:  Older age.  Being overweight or obese.  Overuse of joints, such as in athletes.  Past injury of a joint.  Past surgery on a joint.  Family history of osteoarthritis.  What are the signs or symptoms? The main symptoms of this condition are pain, swelling, and stiffness in the joint. The joint may lose its shape over time. Small pieces of bone or cartilage may break off and float inside of the joint, which may cause more pain and damage to the joint. Small deposits of bone (osteophytes) may grow on the edges of the joint. Other symptoms may include:  A grating or scraping feeling inside the joint when you move it.  Popping or creaking sounds when you move.  Symptoms may affect one or more joints. Osteoarthritis in a major joint, such as your knee or hip, can make it painful to walk or exercise. If you have osteoarthritis in your hands, you might not be able to grip items, twist your hand, or control small movements of your hands and fingers (fine motor skills). How is this diagnosed? This condition may be diagnosed based on:  Your medical history.  A  physical exam.  Your symptoms.  X-rays of the affected joint(s).  Blood tests to rule out other types of arthritis.  How is this treated? There is no cure for this condition, but treatment can help to control pain and improve joint function. Treatment plans may include:  A prescribed exercise program that allows for rest and joint relief. You may work with a physical therapist.  A weight control plan.  Pain relief techniques, such as: ? Applying heat and cold to the joint. ? Electric pulses delivered to nerve endings under the skin (transcutaneous electrical nerve stimulation, or TENS). ? Massage. ? Certain nutritional supplements.  NSAIDs or prescription medicines to help relieve pain.  Medicine to help relieve pain and inflammation (corticosteroids). This can be given by mouth (orally) or as an injection.  Assistive devices, such as a brace, wrap, splint, specialized glove, or cane.  Surgery, such as: ? An osteotomy. This is done to reposition the bones and relieve pain or to remove loose pieces of bone and cartilage. ? Joint replacement surgery. You may need this surgery if you have very bad (advanced) osteoarthritis.  Follow these instructions at home: Activity  Rest your affected joints as directed by your health care provider.  Do not drive or use heavy machinery while taking prescription pain medicine.  Exercise as directed. Your health care provider or physical therapist may recommend specific types of exercise, such as: ? Strengthening exercises. These are done to strengthen the muscles that support joints that are affected by arthritis.   They can be performed with weights or with exercise bands to add resistance. ? Aerobic activities. These are exercises, such as brisk walking or water aerobics, that get your heart pumping. ? Range-of-motion activities. These keep your joints easy to move. ? Balance and agility exercises. Managing pain, stiffness, and  swelling  If directed, apply heat to the affected area as often as told by your health care provider. Use the heat source that your health care provider recommends, such as a moist heat pack or a heating pad. ? If you have a removable assistive device, remove it as told by your health care provider. ? Place a towel between your skin and the heat source. If your health care provider tells you to keep the assistive device on while you apply heat, place a towel between the assistive device and the heat source. ? Leave the heat on for 20-30 minutes. ? Remove the heat if your skin turns bright red. This is especially important if you are unable to feel pain, heat, or cold. You may have a greater risk of getting burned.  If directed, put ice on the affected joint: ? If you have a removable assistive device, remove it as told by your health care provider. ? Put ice in a plastic bag. ? Place a towel between your skin and the bag. If your health care provider tells you to keep the assistive device on during icing, place a towel between the assistive device and the bag. ? Leave the ice on for 20 minutes, 2-3 times a day. General instructions  Take over-the-counter and prescription medicines only as told by your health care provider.  Maintain a healthy weight. Follow instructions from your health care provider for weight control. These may include dietary restrictions.  Do not use any products that contain nicotine or tobacco, such as cigarettes and e-cigarettes. These can delay bone healing. If you need help quitting, ask your health care provider.  Use assistive devices as directed by your health care provider.  Keep all follow-up visits as told by your health care provider. This is important. Where to find more information:  [employer] of Arthritis and Musculoskeletal and Skin Diseases: www.niams.SouthExposed.es  [employer] on Aging: http://kim-miller.com/  American College of Rheumatology:  www.rheumatology.org Contact a health care provider if:  Your skin turns red.  You develop a rash.  You have pain that gets worse.  You have a fever along with joint or muscle aches. Get help right away if:  You lose a lot of weight.  You suddenly lose your appetite.  You have night sweats. Summary  Osteoarthritis is a type of arthritis that affects tissue covering the ends of bones in joints (cartilage).  This condition is caused by age-related wearing down of cartilage that covers the ends of bones.  The main symptom of this condition is pain, swelling, and stiffness in the joint.  There is no cure for this condition, but treatment can help to control pain and improve joint function. This information is not intended to replace advice given to you by your health care provider. Make sure you discuss any questions you have with your health care provider. Document Released: 06/30/2005 Document Revised: 03/03/2016 Document Reviewed: 03/03/2016 Elsevier Interactive Patient Education  2018 [employer].  Exercising to [employer] Exercising can help you to lose weight. In order to lose weight through exercise, you need to do vigorous-intensity exercise. You can tell that you are exercising with vigorous intensity if you are  breathing very hard and fast and cannot hold a conversation while exercising. Moderate-intensity exercise helps to maintain your current weight. You can tell that you are exercising at a moderate level if you have a higher heart rate and faster breathing, but you are still able to hold a conversation. How often should I exercise? Choose an activity that you enjoy and set realistic goals. Your health care provider can help you to make an activity plan that works for you. Exercise regularly as directed by your health care provider. This may include:  Doing resistance training twice each week, such as: ? Push-ups. ? Sit-ups. ? Lifting weights. ? Using resistance  bands.  Doing a given intensity of exercise for a given amount of time. Choose from these options: ? 150 minutes of moderate-intensity exercise every week. ? 75 minutes of vigorous-intensity exercise every week. ? A mix of moderate-intensity and vigorous-intensity exercise every week.  Children, pregnant women, people who are out of shape, people who are overweight, and older adults may need to consult a health care provider for individual recommendations. If you have any sort of medical condition, be sure to consult your health care provider before starting a new exercise program. What are some activities that can help me to lose weight?  Walking at a rate of at least 4.5 miles an hour.  Jogging or running at a rate of 5 miles per hour.  Biking at a rate of at least 10 miles per hour.  Lap swimming.  Roller-skating or in-line skating.  Cross-country skiing.  Vigorous competitive sports, such as football, basketball, and soccer.  Jumping rope.  Aerobic dancing. How can I be more active in my day-to-day activities?  Use the stairs instead of the elevator.  Take a walk during your lunch break.  If you drive, park your car farther away from work or school.  If you take public transportation, get off one stop early and walk the rest of the way.  Make all of your phone calls while standing up and walking around.  Get up, stretch, and walk around every 30 minutes throughout the day. What guidelines should I follow while exercising?  Do not exercise so much that you hurt yourself, feel dizzy, or get very short of breath.  Consult your health care provider prior to starting a new exercise program.  Wear comfortable clothes and shoes with good support.  Drink plenty of water while you exercise to prevent dehydration or heat stroke. Body water is lost during exercise and must be replaced.  Work out until you breathe faster and your heart beats faster. This information is not  intended to replace advice given to you by your health care provider. Make sure you discuss any questions you have with your health care provider. Document Released: 08/02/2010 Document Revised: 12/06/2015 Document Reviewed: 12/01/2013 Elsevier Interactive Patient Education  2018 [place] for [employer] Loss Calories are units of energy. Your body needs a certain amount of calories from food to keep you going throughout the day. When you eat more calories than your body needs, your body stores the extra calories as fat. When you eat fewer calories than your body needs, your body burns fat to get the energy it needs. Calorie counting means keeping track of how many calories you eat and drink each day. Calorie counting can be helpful if you need to lose weight. If you make sure to eat fewer calories than your body needs, you should lose weight.  Ask your health care provider what a healthy weight is for you. For calorie counting to work, you will need to eat the right number of calories in a day in order to lose a healthy amount of weight per week. A dietitian can help you determine how many calories you need in a day and will give you suggestions on how to reach your calorie goal.  A healthy amount of weight to lose per week is usually 1-2 lb (0.5-0.9 kg). This usually means that your daily calorie intake should be reduced by 500-750 calories.  Eating 1,200 - 1,500 calories per day can help most women lose weight.  Eating 1,500 - 1,800 calories per day can help most men lose weight.  What is my plan? My goal is to have __________ calories per day. If I have this many calories per day, I should lose around __________ pounds per week. What do I need to know about calorie counting? In order to meet your daily calorie goal, you will need to:  Find out how many calories are in each food you would like to eat. Try to do this before you eat.  Decide how much of the food you plan to  eat.  Write down what you ate and how many calories it had. Doing this is called keeping a food log.  To successfully lose weight, it is important to balance calorie counting with a healthy lifestyle that includes regular activity. Aim for 150 minutes of moderate exercise (such as walking) or 75 minutes of vigorous exercise (such as running) each week. Where do I find calorie information?  The number of calories in a food can be found on a Nutrition Facts label. If a food does not have a Nutrition Facts label, try to look up the calories online or ask your dietitian for help. Remember that calories are listed per serving. If you choose to have more than one serving of a food, you will have to multiply the calories per serving by the amount of servings you plan to eat. For example, the label on a package of bread might say that a serving size is 1 slice and that there are 90 calories in a serving. If you eat 1 slice, you will have eaten 90 calories. If you eat 2 slices, you will have eaten 180 calories. How do I keep a food log? Immediately after each meal, record the following information in your food log:  What you ate. Don't forget to include toppings, sauces, and other extras on the food.  How much you ate. This can be measured in cups, ounces, or number of items.  How many calories each food and drink had.  The total number of calories in the meal.  Keep your food log near you, such as in a small notebook in your pocket, or use a mobile app or website. Some programs will calculate calories for you and show you how many calories you have left for the day to meet your goal. What are some calorie counting tips?  Use your calories on foods and drinks that will fill you up and not leave you hungry: ? Some examples of foods that fill you up are nuts and nut butters, vegetables, lean proteins, and high-fiber foods like whole grains. High-fiber foods are foods with more than 5 g fiber per  serving. ? Drinks such as sodas, specialty coffee drinks, alcohol, and juices have a lot of calories, yet do not fill you up.  Eat nutritious foods and avoid empty calories. Empty calories are calories you get from foods or beverages that do not have many vitamins or protein, such as candy, sweets, and soda. It is better to have a nutritious high-calorie food (such as an avocado) than a food with few nutrients (such as a bag of chips).  Know how many calories are in the foods you eat most often. This will help you calculate calorie counts faster.  Pay attention to calories in drinks. Low-calorie drinks include water and unsweetened drinks.  Pay attention to nutrition labels for "low fat" or "fat free" foods. These foods sometimes have the same amount of calories or more calories than the full fat versions. They also often have added sugar, starch, or salt, to make up for flavor that was removed with the fat.  Find a way of tracking calories that works for you. Get creative. Try different apps or programs if writing down calories does not work for you. What are some portion control tips?  Know how many calories are in a serving. This will help you know how many servings of a certain food you can have.  Use a measuring cup to measure serving sizes. You could also try weighing out portions on a kitchen scale. With time, you will be able to estimate serving sizes for some foods.  Take some time to put servings of different foods on your favorite plates, bowls, and cups so you know what a serving looks like.  Try not to eat straight from a bag or box. Doing this can lead to overeating. Put the amount you would like to eat in a cup or on a plate to make sure you are eating the right portion.  Use smaller plates, glasses, and bowls to prevent overeating.  Try not to multitask (for example, watch TV or use your computer) while eating. If it is time to eat, sit down at a table and enjoy your food.  This will help you to know when you are full. It will also help you to be aware of what you are eating and how much you are eating. What are tips for following this plan? Reading food labels  Check the calorie count compared to the serving size. The serving size may be smaller than what you are used to eating.  Check the source of the calories. Make sure the food you are eating is high in vitamins and protein and low in saturated and trans fats. Shopping  Read nutrition labels while you shop. This will help you make healthy decisions before you decide to purchase your food.  Make a grocery list and stick to it. Cooking  Try to cook your favorite foods in a healthier way. For example, try baking instead of frying.  Use low-fat dairy products. Meal planning  Use more fruits and vegetables. Half of your plate should be fruits and vegetables.  Include lean proteins like poultry and fish. How do I count calories when eating out?  Ask for smaller portion sizes.  Consider sharing an entree and sides instead of getting your own entree.  If you get your own entree, eat only half. Ask for a box at the beginning of your meal and put the rest of your entree in it so you are not tempted to eat it.  If calories are listed on the menu, choose the lower calorie options.  Choose dishes that include vegetables, fruits, whole grains, low-fat dairy products, and lean protein.  Choose items that are boiled, broiled, grilled, or steamed. Stay away from items that are buttered, battered, fried, or served with cream sauce. Items labeled "crispy" are usually fried, unless stated otherwise.  Choose water, low-fat milk, unsweetened iced tea, or other drinks without added sugar. If you want an alcoholic beverage, choose a lower calorie option such as a glass of wine or light beer.  Ask for dressings, sauces, and syrups on the side. These are usually high in calories, so you should limit the amount you  eat.  If you want a salad, choose a garden salad and ask for grilled meats. Avoid extra toppings like bacon, cheese, or fried items. Ask for the dressing on the side, or ask for olive oil and vinegar or lemon to use as dressing.  Estimate how many servings of a food you are given. For example, a serving of cooked rice is  cup or about the size of half a baseball. Knowing serving sizes will help you be aware of how much food you are eating at restaurants. The list below tells you how big or small some common portion sizes are based on everyday objects: ? 1 oz-4 stacked dice. ? 3 oz-1 deck of cards. ? 1 tsp-1 die. ? 1 Tbsp- a ping-pong ball. ? 2 Tbsp-1 ping-pong ball. ?  cup- baseball. ? 1 cup-1 baseball. Summary  Calorie counting means keeping track of how many calories you eat and drink each day. If you eat fewer calories than your body needs, you should lose weight.  A healthy amount of weight to lose per week is usually 1-2 lb (0.5-0.9 kg). This usually means reducing your daily calorie intake by 500-750 calories.  The number of calories in a food can be found on a Nutrition Facts label. If a food does not have a Nutrition Facts label, try to look up the calories online or ask your dietitian for help.  Use your calories on foods and drinks that will fill you up, and not on foods and drinks that will leave you hungry.  Use smaller plates, glasses, and bowls to prevent overeating. This information is not intended to replace advice given to you by your health care provider. Make sure you discuss any questions you have with your health care provider. Document Released: 06/30/2005 Document Revised: 05/30/2016 Document Reviewed: 05/30/2016 Elsevier Interactive Patient Education  [employer].

## 2017-09-28 NOTE — Progress Notes (Signed)
Subjective:  Patient ID: Peter Oconnor, male    DOB: 08/08/1960  Age: 57 y.o. MRN: [id number]  CC: Establish Care   HPI Mihir Flanigan presents for evaluation of left knee pain.  He has been experiencing pain with ambulation decreased flexion.  He cannot recall any specific injury to this knee.  He is status post meniscus repair of his right knee some years ago.  His left knee has not locked up or giving way on him.  He has a story past medical history to include a 90% lesion involving his LAD that was treated with standing and follow-up high-dose statin therapy.  He remains on Brilinta for 4 years after the discovery of this lesion.  He quit smoking cigars back in 2014.  Strong family history of vascular disease.  He currently lives with his wife and is retired from the "worked well.  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  He has not had a colonoscopy.    Historye Myrick has a past medical history of Benign essential HTN (10/19/2015), CAD (coronary artery disease), native coronary artery (01/25/2014), Hyperlipidemia with target LDL less than 70 (10/19/2015), MI, old, Obesity (BMI 30-39.9) (10/19/2015), and OSA (obstructive sleep apnea) (10/19/2015).   He has a past surgical history that includes Cardiac catheterization and Coronary angioplasty (01/25/2014).   His family history includes Heart attack in his maternal grandfather; Heart disease in his maternal grandfather; Leukemia in his father.He reports that he has quit smoking. he has never used smokeless tobacco. He reports that he drinks alcohol. He reports that he does not use drugs.  Outpatient Medications Prior to Visit  Medication Sig Dispense Refill  . aspirin 81 MG tablet Take 81 mg by mouth daily.    . Coenzyme Q10 (CO Q-10) 200 MG CAPS Take 1 capsule by mouth daily.    Marland Kitchen lisinopril (PRINIVIL,ZESTRIL) 5 MG tablet TAKE ONE TABLET BY MOUTH DAILY 30 tablet 0  . metoprolol tartrate  (LOPRESSOR) 25 MG tablet Take 1 tablet (25 mg total) by mouth 2 (two) times daily. 180 tablet 3  . rosuvastatin (CRESTOR) 40 MG tablet TAKE ONE TABLET BY MOUTH DAILY 30 tablet 0  . ticagrelor (BRILINTA) 60 MG TABS tablet Take 1 tablet (60 mg total) by mouth 2 (two) times daily. 180 tablet 3  . nitroGLYCERIN (NITROSTAT) 0.4 MG SL tablet Place 1 tablet (0.4 mg total) under the tongue every 5 (five) minutes as needed for chest pain. 25 tablet 3   No facility-administered medications prior to visit.     ROS Review of Systems  Constitutional: Negative for chills, fatigue, fever and unexpected weight change.  Respiratory: Negative.   Cardiovascular: Negative.   Gastrointestinal: Negative.   Musculoskeletal: Positive for arthralgias, gait problem and joint swelling.  Skin: Negative for pallor and wound.  Allergic/Immunologic: Negative for immunocompromised state.  Neurological: Negative for weakness.  Hematological: Does not bruise/bleed easily.  Psychiatric/Behavioral: Negative.     Objective:  BP 128/78 (BP Location: Left Arm, Patient Position: Sitting, Cuff Size: Normal)   Pulse 72   Ht 5\' 11"  (1.803 m)   Wt 274 lb 8 oz (124.5 kg)   SpO2 97%   BMI 38.28 kg/m   Physical Exam  Constitutional: He is oriented to person, place, and time. He appears well-developed and well-nourished. No distress.  HENT:  Head: Normocephalic and atraumatic.  Right Ear: External ear normal.  Left Ear: External ear normal.  Eyes: Right eye exhibits no discharge. Left eye exhibits no discharge. No scleral icterus.  Neck: No JVD present. No tracheal deviation present.  Pulmonary/Chest: Effort normal. No stridor.  Musculoskeletal:       Left knee: He exhibits decreased range of motion, swelling and effusion. Tenderness found. Medial joint line tenderness noted.  Neurological: He is alert and oriented to person, place, and time.  Skin: Skin is warm and dry. He is not diaphoretic.  Psychiatric: He has a  normal mood and affect. His behavior is normal.      Assessment & Plan:   Jerrin was seen today for establish care.  Diagnoses and all orders for this visit:  Left knee pain, unspecified chronicity -     DG Knee Complete 4 Views Left; Future -     DG Knee Complete 4 Views Left -     Ambulatory referral to Sports Medicine  Effusion of knee joint, left -     Ambulatory referral to Sports Medicine  Primary osteoarthritis of both knees -     Ambulatory referral to Sports Medicine  Hyperlipidemia with target LDL less than 70  Obesity (BMI 30-39.9)  Coronary artery disease involving native coronary artery of native heart without angina pectoris   I have discontinued [name]'s nitroGLYCERIN. I am also having him maintain his aspirin, Co Q-10, metoprolol tartrate, ticagrelor, rosuvastatin, and lisinopril.  No orders of the defined types were placed in this encounter.  X-rays confirm osteoarthritis in both knees that is actually worse on the right side through the medial compartment.  Discussed weight loss as a primary treatment for this.  Will refer to sports medicine for possible injection of steroid.  Patient patient will follow-up sometime in late April or May for complete physical exam.  He has not had a colonoscopy.  Follow-up: Return will return for cpe. Libby Maw, MD

## 2017-09-29 ENCOUNTER — Encounter: Payer: Self-pay | Admitting: Family Medicine

## 2017-10-02 ENCOUNTER — Ambulatory Visit: Payer: Self-pay

## 2017-10-02 ENCOUNTER — Other Ambulatory Visit: Payer: Self-pay

## 2017-10-02 ENCOUNTER — Encounter: Payer: Self-pay | Admitting: Family Medicine

## 2017-10-02 ENCOUNTER — Ambulatory Visit (INDEPENDENT_AMBULATORY_CARE_PROVIDER_SITE_OTHER): Payer: BLUE CROSS/BLUE SHIELD | Admitting: Family Medicine

## 2017-10-02 VITALS — BP 134/72 | HR 100 | Temp 98.3°F | Ht 71.0 in | Wt 271.0 lb

## 2017-10-02 DIAGNOSIS — M25562 Pain in left knee: Secondary | ICD-10-CM

## 2017-10-02 NOTE — Patient Instructions (Signed)
Take tylenol 650 mg three times a day is the best evidence based medicine we have for arthritis.   Glucosamine sulfate 750mg  twice a day is a supplement that has been shown to help moderate to severe arthritis.  Vitamin D 2000 IU daily  Fish oil 2 grams daily.   Tumeric 500mg  twice daily.   Capsaicin topically up to four times a day may also help with pain.  Cortisone injections are an option if these interventions do not seem to make a difference or need more relief.   If cortisone injections do not help, there are different types of shots that may help but they take longer to take effect.  We can discuss this at follow up.   It's important that you continue to stay active.  Controlling your weight is important.   Consider physical therapy to strengthen muscles around the joint that hurts to take pressure off of the joint itself.  Shoe inserts with good arch support may be helpful.    Water aerobics and cycling with low resistance are the best two types of exercise for arthritis.

## 2017-10-02 NOTE — Assessment & Plan Note (Signed)
Pain is likely arthritic in nature. May have a component of degenerative meniscus but no mechanical symptoms today - injection  - counseled on ice and compression  - counseled on HEP  - provided Pennsaid samples.

## 2017-10-02 NOTE — Progress Notes (Signed)
Peter Oconnor - 57 y.o. male MRN [id number]  Date of birth: 1961-06-21  SUBJECTIVE:  Including CC & ROS.  Chief Complaint  Patient presents with  . Left knee pain     Peter Oconnor is a 57 y.o. male that is presenting with left knee pain.  Pain has been ongoing for five months. Pain is located on the medial aspect of his left knee. Denies tingling and numbness. Flexion and extension increase his pain, mild to severe. He walks daily, recently has not been able to walk due to the pain. Admits to swelling and tenderness. Denies injuries or surgeries. He has not taken anything for his pain. Pain is localized to the knee. No redness. No giving way or locking.   Independent review of his left knee x-ray from 3/18 shows medial joint space narrowing of the right knee and a bipartite patella. Left medial joint space narrowing is mild in nature.   Review of Systems  Constitutional: Negative for fever.  HENT: Negative for congestion.   Respiratory: Negative for cough.   Cardiovascular: Negative for chest pain.  Gastrointestinal: Negative for abdominal pain.  Musculoskeletal: Positive for joint swelling.  Skin: Negative for color change.  Allergic/Immunologic: Negative for immunocompromised state.  Neurological: Negative for weakness.  Hematological: Negative for adenopathy.  Psychiatric/Behavioral: Negative for agitation.    HISTORY: Past Medical, Surgical, Social, and Family History Reviewed & Updated per EMR.   Pertinent Historical Findings include:  Past Medical History:  Diagnosis Date  . 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    Marland Kitchen Chicken pox   . Hyperlipidemia with target LDL less than 70 10/19/2015  . MI, old   . Obesity (BMI 30-39.9) 10/19/2015  . OSA (obstructive sleep apnea) 10/19/2015    Past Surgical History:    Procedure Laterality Date  . CARDIAC CATHETERIZATION    . CORONARY ANGIOPLASTY  01/25/2014   PCI of LCx and LAD  . KNEE SURGERY      Allergies  Allergen Reactions  . Penicillins Rash    Also itching    Family History  Problem Relation Age of Onset  . Hyperlipidemia Mother   . Leukemia Father   . Arthritis Father   . Heart attack Maternal Grandfather   . Heart disease Maternal Grandfather   . Hyperlipidemia Maternal Grandfather   . Stroke Maternal Grandfather   . Hyperlipidemia Maternal Grandmother      Social History   Socioeconomic History  . Marital status: Married    Spouse name: Not on file  . Number of children: Not on file  . Years of education: Not on file  . Highest education level: Not on file  Occupational History  . Not on file  Social Needs  . Financial resource strain: Not on file  . Food insecurity:    Worry: Not on file    Inability: Not on file  . Transportation needs:    Medical: Not on file    Non-medical: Not on file  Tobacco Use  . Smoking status: Former Research scientist (life sciences)  . Smokeless tobacco: Never Used  Substance and Sexual Activity  . Alcohol use: Yes  . Drug use: No  . Sexual activity: Not on file  Lifestyle  . Physical activity:    Days per week: Not on file    Minutes per  session: Not on file  . Stress: Not on file  Relationships  . Social connections:    Talks on phone: Not on file    Gets together: Not on file    Attends religious service: Not on file    Active member of club or organization: Not on file    Attends meetings of clubs or organizations: Not on file    Relationship status: Not on file  . Intimate partner violence:    Fear of current or ex partner: Not on file    Emotionally abused: Not on file    Physically abused: Not on file    Forced sexual activity: Not on file  Other Topics Concern  . Not on file  Social History Narrative  . Not on file     PHYSICAL EXAM:  VS: BP 134/72 (BP Location: Left Arm, Patient  Position: Sitting, Cuff Size: Normal)   Pulse 100   Temp 98.3 F (36.8 C) (Oral)   Ht 5\' 11"  (1.803 m)   Wt 271 lb (122.9 kg)   SpO2 96%   BMI 37.80 kg/m  Physical Exam Gen: NAD, alert, cooperative with exam, well-appearing ENT: normal lips, normal nasal mucosa,  Eye: normal EOM, normal conjunctiva and lids CV:  no edema, +2 pedal pulses   Resp: no accessory muscle use, non-labored,  Skin: no rashes, no areas of induration  Neuro: normal tone, normal sensation to touch Psych:  normal insight, alert and oriented MSK:  Left knee:  Normal to inspection with no erythema or obvious bony abnormalities. Palpation normal with no warmth,  patellar tenderness, or condyle tenderness. TTP of the medial joint line  Some limited flexion to about 100 Normal extension No instability  Negative Mcmurray's Non painful patellar compression. Patellar glide without crepitus. Patellar and quadriceps tendons unremarkable. Hamstring and quadriceps strength is normal.  Neurovascularly intact    Limited ultrasound: left knee:  Mild effusion in SPP  Normal lateral joint line  Medial joint space narrowing with outpouching of medial meniscus    Summary: degenerative changes of left knee   Ultrasound and interpretation by Clearance Coots, MD   Aspiration/Injection Procedure Note Peter Oconnor 24-Jun-1961  Procedure: Injection Indications: left knee pain   Procedure Details Consent: Risks of procedure as well as the alternatives and risks of each were explained to the (patient/caregiver).  Consent for procedure obtained. Time Out: Verified patient identification, verified procedure, site/side was marked, verified correct patient position, special equipment/implants available, medications/allergies/relevent history reviewed, required imaging and test results available.  Performed.  The area was cleaned with iodine and alcohol swabs.    The left knee SPP superiorlateral aspect was injected using  1 cc's of 40 mg Depomedrol and 4 cc's of 1% lidocaine with a 22 1 1/2" needle.  Ultrasound was used. Images were obtained in Long views showing the injection.    A sterile dressing was applied.  Patient did tolerate procedure well.    ASSESSMENT & PLAN:   Left knee pain Pain is likely arthritic in nature. May have a component of degenerative meniscus but no mechanical symptoms today - injection  - counseled on ice and compression  - counseled on HEP  - provided Pennsaid samples.

## 2017-10-12 ENCOUNTER — Other Ambulatory Visit: Payer: Self-pay | Admitting: Cardiology

## 2017-11-09 ENCOUNTER — Ambulatory Visit (INDEPENDENT_AMBULATORY_CARE_PROVIDER_SITE_OTHER): Payer: BLUE CROSS/BLUE SHIELD | Admitting: Cardiology

## 2017-11-09 ENCOUNTER — Other Ambulatory Visit: Payer: Self-pay | Admitting: Cardiology

## 2017-11-09 VITALS — BP 118/70 | HR 58 | Ht 71.0 in | Wt 272.0 lb

## 2017-11-09 DIAGNOSIS — I2510 Atherosclerotic heart disease of native coronary artery without angina pectoris: Secondary | ICD-10-CM | POA: Diagnosis not present

## 2017-11-09 DIAGNOSIS — I10 Essential (primary) hypertension: Secondary | ICD-10-CM | POA: Diagnosis not present

## 2017-11-09 DIAGNOSIS — E785 Hyperlipidemia, unspecified: Secondary | ICD-10-CM | POA: Diagnosis not present

## 2017-11-09 LAB — BASIC METABOLIC PANEL
BUN / CREAT RATIO: 19 (ref 9–20)
BUN: 18 mg/dL (ref 6–24)
CALCIUM: 9.4 mg/dL (ref 8.7–10.2)
CHLORIDE: 101 mmol/L (ref 96–106)
CO2: 22 mmol/L (ref 20–29)
Creatinine, Ser: 0.94 mg/dL (ref 0.76–1.27)
GFR calc non Af Amer: 92 mL/min/{1.73_m2} (ref >59)
GFR, EST AFRICAN AMERICAN: 106 mL/min/{1.73_m2} (ref >59)
Glucose: 101 mg/dL — ABNORMAL HIGH (ref 65–99)
POTASSIUM: 4.4 mmol/L (ref 3.5–5.2)
SODIUM: 139 mmol/L (ref 134–144)

## 2017-11-09 LAB — HEPATIC FUNCTION PANEL
ALT: 46 IU/L — AB (ref 0–44)
AST: 29 IU/L (ref 0–40)
Albumin: 4.8 g/dL (ref 3.5–5.5)
Alkaline Phosphatase: 51 IU/L (ref 39–117)
BILIRUBIN TOTAL: 0.6 mg/dL (ref 0.0–1.2)
Bilirubin, Direct: 0.18 mg/dL (ref 0.00–0.40)
Total Protein: 7.1 g/dL (ref 6.0–8.5)

## 2017-11-09 LAB — LIPID PANEL
CHOLESTEROL TOTAL: 149 mg/dL (ref 100–199)
Chol/HDL Ratio: 3.3 ratio (ref 0.0–5.0)
HDL: 45 mg/dL (ref >39)
LDL Calculated: 88 mg/dL (ref 0–99)
Triglycerides: 81 mg/dL (ref 0–149)
VLDL CHOLESTEROL CAL: 16 mg/dL (ref 5–40)

## 2017-11-09 MED ORDER — ROSUVASTATIN CALCIUM 40 MG PO TABS: 40.0000 mg | ORAL_TABLET | Freq: Every day | ORAL | 3 refills | Status: DC

## 2017-11-09 MED ORDER — LISINOPRIL 5 MG PO TABS: 5.0000 mg | ORAL_TABLET | Freq: Every day | ORAL | 3 refills | Status: DC

## 2017-11-09 MED ORDER — METOPROLOL TARTRATE 25 MG PO TABS: 25.0000 mg | ORAL_TABLET | Freq: Two times a day (BID) | ORAL | 3 refills | Status: DC

## 2017-11-09 MED ORDER — TICAGRELOR 60 MG PO TABS: 60.0000 mg | ORAL_TABLET | Freq: Two times a day (BID) | ORAL | 3 refills | Status: DC

## 2017-11-09 NOTE — Progress Notes (Addendum)
Cardiology Office Note:    Date:  11/09/2017   ID:  Peter Oconnor, DOB [birth year], MRN [id number]  PCP:  Peter Maw, MD  Cardiologist:  No primary care provider on file.    Referring MD: No ref. provider found   Chief Complaint  Patient presents with  . Coronary Artery Disease  . Hypertension  . Hyperlipidemia    History of Present Illness:    Peter Oconnor is a 57 y.o. male with a hx of CAD s/p anterior MI in 01/2014 while living in [place]. Cath revealed 30-40% RCA, occluded LCX, and a 90% LAD. He underwent PCI + stenting of his LCx and LAD.  Echo 01/2014 showed EF 60-65% with LVH, mid TR and mild to moderate AVSC and lateral wall infarct. He also has a h/o tobacco abuse, HTN, obesity and OSA. He is here today for followup and is doing well.  He denies any chest pain or pressure, SOB, DOE, PND, orthopnea, LE edema, dizziness, palpitations or syncope. He is compliant with his meds and is tolerating meds with no SE.     Past Medical History:  Diagnosis Date  . 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    Marland Kitchen Chicken pox   . Hyperlipidemia with target LDL less than 70 10/19/2015  . MI, old   . Obesity (BMI 30-39.9) 10/19/2015  . OSA (obstructive sleep apnea) 10/19/2015    Past Surgical History:  Procedure Laterality Date  . CARDIAC CATHETERIZATION    . CORONARY ANGIOPLASTY  01/25/2014   PCI of LCx and LAD  . KNEE SURGERY      Current Medications: Current Meds  Medication Sig  . aspirin 81 MG tablet Take 81 mg by mouth daily.  Marland Kitchen BRILINTA 60 MG TABS tablet TAKE ONE TABLET BY MOUTH TWICE A DAY  . Coenzyme Q10 (CO Q-10) 200 MG CAPS Take 1 capsule by mouth daily.  Marland Kitchen lisinopril (PRINIVIL,ZESTRIL) 5 MG tablet TAKE ONE TABLET BY MOUTH DAILY  . metoprolol tartrate (LOPRESSOR) 25 MG tablet TAKE ONE TABLET BY  MOUTH TWICE A DAY  . rosuvastatin (CRESTOR) 40 MG tablet TAKE ONE TABLET BY MOUTH DAILY     Allergies:   Penicillins   Social History   Socioeconomic History  . Marital status: Married    Spouse name: Not on file  . Number of children: Not on file  . Years of education: Not on file  . Highest education level: Not on file  Occupational History  . Not on file  Social Needs  . Financial resource strain: Not on file  . Food insecurity:    Worry: Not on file    Inability: Not on file  . Transportation needs:    Medical: Not on file    Non-medical: Not on file  Tobacco Use  . Smoking status: Former Research scientist (life sciences)  . Smokeless tobacco: Never Used  Substance and Sexual Activity  . Alcohol use: Yes  . Drug use: No  . Sexual activity: Not on file  Lifestyle  . Physical activity:    Days per week: Not on file    Minutes per session: Not on file  . Stress: Not on file  Relationships  . Social connections:    Talks on phone: Not on file    Gets together: Not on file  Attends religious service: Not on file    Active member of club or organization: Not on file    Attends meetings of clubs or organizations: Not on file    Relationship status: Not on file  Other Topics Concern  . Not on file  Social History Narrative  . Not on file     Family History: The patient's family history includes Arthritis in his father; Heart attack in his maternal grandfather; Heart disease in his maternal grandfather; Hyperlipidemia in his maternal grandfather, maternal grandmother, and mother; Leukemia in his father; Stroke in his maternal grandfather.  ROS:   Please see the history of present illness.    ROS  All other systems reviewed and negative.   EKGs/Labs/Other Studies Reviewed:    The following studies were reviewed today:   EKG:  EKG is  ordered today.  The ekg ordered today demonstrates Sinus bradycardia at 58bpm with inferolateral T wave abnormality  Recent Labs: 12/01/2016: ALT 18    Recent Lipid Panel    Component Value Date/Time   CHOL 146 12/01/2016 0759   TRIG 143 12/01/2016 0759   HDL 44 12/01/2016 0759   CHOLHDL 3.3 12/01/2016 0759   CHOLHDL 3.1 10/25/2015 0731   VLDL 24 10/25/2015 0731   LDLCALC 73 12/01/2016 0759    Physical Exam:    VS:  BP 118/70   Pulse (!) 58   Ht 5\' 11"  (1.803 m)   Wt 272 lb (123.4 kg)   BMI 37.94 kg/m     Wt Readings from Last 3 Encounters:  11/09/17 272 lb (123.4 kg)  10/02/17 271 lb (122.9 kg)  09/28/17 274 lb 8 oz (124.5 kg)     GEN:  Well nourished, well developed in no acute distress HEENT: Normal NECK: No JVD; No carotid bruits LYMPHATICS: No lymphadenopathy CARDIAC: RRR, no murmurs, rubs, gallops RESPIRATORY:  Clear to auscultation without rales, wheezing or rhonchi  ABDOMEN: Soft, non-tender, non-distended MUSCULOSKELETAL:  No edema; No deformity  SKIN: Warm and dry NEUROLOGIC:  Alert and oriented x 3 PSYCHIATRIC:  Normal affect   ASSESSMENT:    1. Coronary artery disease involving native coronary artery of native heart without angina pectoris   2. Benign essential HTN   3. Hyperlipidemia with target LDL less than 70   4. Obesity (BMI 30-39.9)    PLAN:    In order of problems listed above:  1.  ASCAD - s/p anterior MI in 01/2014 while living in [place]. Cath revealed 30-40% RCA, occluded LCX, and a 90% LAD. He underwent PCI + stenting of his LCx and LAD.  He denies any anginal sx.  He will continue on ASA 81mg  daily and Brilinta 60mg  BID as well as BB and statin.  2.  HTN - BP is well controlled on exam today.  He will continue on Lopressor 25mg  BID and Lisinopril 5mg  daily.   3.  Hyperlipidemia - LDL is < 70.  He will continue on crestor 40mg  daily and I will repeat an FLP and ALT in am.      Medication Adjustments/Labs and Tests Ordered: Current medicines are reviewed at length with the patient today.  Concerns regarding medicines are outlined above.  No orders of the defined types were  placed in this encounter.  No orders of the defined types were placed in this encounter.   Signed, Fransico Him, MD  11/09/2017 8:18 AM    [place]

## 2017-11-09 NOTE — Patient Instructions (Signed)
Medication Instructions:  Your physician recommends that you continue on your current medications as directed. Please refer to the Current Medication list given to you today.  If you need a refill on your cardiac medications, please contact your pharmacy first.  Labwork: Today for BMET, liver function and cholesterol.   Testing/Procedures: None ordered   Follow-Up: Your physician wants you to follow-up in: 1 year with Dr. Radford Pax. You will receive a reminder letter in the mail two months in advance. If you don't receive a letter, please call our office to schedule the follow-up appointment.  Any Other Special Instructions Will Be Listed Below (If Applicable).   Thank you for choosing [facility], RN  [phone]  If you need a refill on your cardiac medications before your next appointment, please call your pharmacy.

## 2017-11-10 ENCOUNTER — Telehealth: Payer: Self-pay

## 2017-11-10 DIAGNOSIS — E785 Hyperlipidemia, unspecified: Secondary | ICD-10-CM

## 2017-11-10 NOTE — Telephone Encounter (Signed)
Notes recorded by Teressa Senter, RN on 11/10/2017 at 9:15 AM EDT Patient made aware of lab results. Patient stated he was on vacation 1 week prior to lab draw. He would like to do diet control and exercise to lower LDL level verses adding new medication at this time. He states he is well aware of dietary recommendations. He is scheduled for repeat labs on 02/02/18. Patient verbalized understanding and thankful for the call.   Notes recorded by Sueanne Margarita, MD on 11/09/2017 at 4:00 PM EDT LDL not at goal - add Zetia 10mg  daily and repeat FLp and ALT in 6 weeks

## 2017-11-16 ENCOUNTER — Encounter: Payer: Self-pay | Admitting: Family Medicine

## 2017-11-16 ENCOUNTER — Ambulatory Visit (INDEPENDENT_AMBULATORY_CARE_PROVIDER_SITE_OTHER): Payer: BLUE CROSS/BLUE SHIELD | Admitting: Family Medicine

## 2017-11-16 ENCOUNTER — Encounter: Payer: Self-pay | Admitting: Cardiology

## 2017-11-16 ENCOUNTER — Encounter: Payer: Self-pay | Admitting: Gastroenterology

## 2017-11-16 VITALS — BP 124/80 | HR 66 | Ht 71.0 in | Wt 269.2 lb

## 2017-11-16 DIAGNOSIS — Z1211 Encounter for screening for malignant neoplasm of colon: Secondary | ICD-10-CM | POA: Diagnosis not present

## 2017-11-16 DIAGNOSIS — R739 Hyperglycemia, unspecified: Secondary | ICD-10-CM | POA: Diagnosis not present

## 2017-11-16 DIAGNOSIS — Z1159 Encounter for screening for other viral diseases: Secondary | ICD-10-CM

## 2017-11-16 DIAGNOSIS — N5201 Erectile dysfunction due to arterial insufficiency: Secondary | ICD-10-CM

## 2017-11-16 DIAGNOSIS — Z0000 Encounter for general adult medical examination without abnormal findings: Secondary | ICD-10-CM | POA: Diagnosis not present

## 2017-11-16 LAB — URINALYSIS, ROUTINE W REFLEX MICROSCOPIC
Bilirubin Urine: NEGATIVE
Hgb urine dipstick: NEGATIVE
Ketones, ur: NEGATIVE
Leukocytes, UA: NEGATIVE
Nitrite: NEGATIVE
PH: 6 (ref 5.0–8.0)
RBC / HPF: NONE SEEN (ref 0–?)
Specific Gravity, Urine: 1.01 (ref 1.000–1.030)
TOTAL PROTEIN, URINE-UPE24: NEGATIVE
UROBILINOGEN UA: 1 (ref 0.0–1.0)
Urine Glucose: NEGATIVE

## 2017-11-16 LAB — HEMOGLOBIN A1C: HEMOGLOBIN A1C: 5.7 % (ref 4.6–6.5)

## 2017-11-16 LAB — CBC
HEMATOCRIT: 43.2 % (ref 39.0–52.0)
HEMOGLOBIN: 14.7 g/dL (ref 13.0–17.0)
MCHC: 33.9 g/dL (ref 30.0–36.0)
MCV: 91.3 fl (ref 78.0–100.0)
Platelets: 231 10*3/uL (ref 150.0–400.0)
RBC: 4.73 Mil/uL (ref 4.22–5.81)
RDW: 14 % (ref 11.5–15.5)
WBC: 5.4 10*3/uL (ref 4.0–10.5)

## 2017-11-16 MED ORDER — SILDENAFIL CITRATE 20 MG PO TABS: ORAL_TABLET | ORAL | 0 refills | Status: DC

## 2017-11-16 NOTE — Patient Instructions (Signed)
Health Maintenance, Male A healthy lifestyle and preventive care is important for your health and wellness. Ask your health care provider about what schedule of regular examinations is right for you. What should I know about weight and diet? Eat a Healthy Diet  Eat plenty of vegetables, fruits, whole grains, low-fat dairy products, and lean protein.  Do not eat a lot of foods high in solid fats, added sugars, or salt.  Maintain a Healthy Weight Regular exercise can help you achieve or maintain a healthy weight. You should:  Do at least 150 minutes of exercise each week. The exercise should increase your heart rate and make you sweat (moderate-intensity exercise).  Do strength-training exercises at least twice a week.  Watch Your Levels of Cholesterol and Blood Lipids  Have your blood tested for lipids and cholesterol every 5 years starting at 55 years of age. If you are at high risk for heart disease, you should start having your blood tested when you are 55 years old. You may need to have your cholesterol levels checked more often if: ? Your lipid or cholesterol levels are high. ? You are older than 55 years of age. ? You are at high risk for heart disease.  What should I know about cancer screening? Many types of cancers can be detected early and may often be prevented. Lung Cancer  You should be screened every year for lung cancer if: ? You are a current smoker who has smoked for at least 30 years. ? You are a former smoker who has quit within the past 15 years.  Talk to your health care provider about your screening options, when you should start screening, and how often you should be screened.  Colorectal Cancer  Routine colorectal cancer screening usually begins at 55 years of age and should be repeated every 5-10 years until you are 55 years old. You may need to be screened more often if early forms of precancerous polyps or small growths are found. Your health care provider  may recommend screening at an earlier age if you have risk factors for colon cancer.  Your health care provider may recommend using home test kits to check for hidden blood in the stool.  A small camera at the end of a tube can be used to examine your colon (sigmoidoscopy or colonoscopy). This checks for the earliest forms of colorectal cancer.  Prostate and Testicular Cancer  Depending on your age and overall health, your health care provider may do certain tests to screen for prostate and testicular cancer.  Talk to your health care provider about any symptoms or concerns you have about testicular or prostate cancer.  Skin Cancer  Check your skin from head to toe regularly.  Tell your health care provider about any new moles or changes in moles, especially if: ? There is a change in a mole's size, shape, or color. ? You have a mole that is larger than a pencil eraser.  Always use sunscreen. Apply sunscreen liberally and repeat throughout the day.  Protect yourself by wearing long sleeves, pants, a wide-brimmed hat, and sunglasses when outside.  What should I know about heart disease, diabetes, and high blood pressure?  If you are 89-13 years of age, have your blood pressure checked every 3-5 years. If you are 70 years of age or older, have your blood pressure checked every year. You should have your blood pressure measured twice-once when you are at a hospital or clinic, and once when  you are not at a hospital or clinic. Record the average of the two measurements. To check your blood pressure when you are not at a hospital or clinic, you can use: ? An automated blood pressure machine at a pharmacy. ? A home blood pressure monitor.  Talk to your health care provider about your target blood pressure.  If you are between 18-11 years old, ask your health care provider if you should take aspirin to prevent heart disease.  Have regular diabetes screenings by checking your fasting blood  sugar level. ? If you are at a normal weight and have a low risk for diabetes, have this test once every three years after the age of 70. ? If you are overweight and have a high risk for diabetes, consider being tested at a younger age or more often.  A one-time screening for abdominal aortic aneurysm (AAA) by ultrasound is recommended for men aged 65-75 years who are current or former smokers. What should I know about preventing infection? Hepatitis B If you have a higher risk for hepatitis B, you should be screened for this virus. Talk with your health care provider to find out if you are at risk for hepatitis B infection. Hepatitis C Blood testing is recommended for:  Everyone born from [birth year] through 1963.  Anyone with known risk factors for hepatitis C.  Sexually Transmitted Diseases (STDs)  You should be screened each year for STDs including gonorrhea and chlamydia if: ? You are sexually active and are younger than 55 years of age. ? You are older than 55 years of age and your health care provider tells you that you are at risk for this type of infection. ? Your sexual activity has changed since you were last screened and you are at an increased risk for chlamydia or gonorrhea. Ask your health care provider if you are at risk.  Talk with your health care provider about whether you are at high risk of being infected with HIV. Your health care provider may recommend a prescription medicine to help prevent HIV infection.  What else can I do?  Schedule regular health, dental, and eye exams.  Stay current with your vaccines (immunizations).  Do not use any tobacco products, such as cigarettes, chewing tobacco, and e-cigarettes. If you need help quitting, ask your health care provider.  Limit alcohol intake to no more than 2 drinks per day. One drink equals 12 ounces of beer, 5 ounces of wine, or 1 ounces of hard liquor.  Do not use street drugs.  Do not share needles.  Ask your  health care provider for help if you need support or information about quitting drugs.  Tell your health care provider if you often feel depressed.  Tell your health care provider if you have ever been abused or do not feel safe at home. This information is not intended to replace advice given to you by your health care provider. Make sure you discuss any questions you have with your health care provider. Document Released: 12/27/2007 Document Revised: 02/27/2016 Document Reviewed: 04/03/2015 Elsevier Interactive Patient Education  2018 [employer].  How to Increase Your Level of Physical Activity Getting regular physical activity is important for your overall health and well-being. Most people do not get enough exercise. There are easy ways to increase your level of physical activity, even if you have not been very active in the past or you are just starting out. Why is physical activity important? Physical activity has many  short-term and long-term health benefits. Regular exercise can:  Help you lose weight or maintain a healthy weight.  Strengthen your muscles and bones.  Boost your mood and improve self-esteem.  Reduce your risk of certain long-term (chronic) diseases, like heart disease, cancer, and diabetes.  Help you stay capable of walking and moving around (mobile) as you age.  Prevent accidents, such as falls, as you age.  Increase life expectancy.  What are the benefits of being physically active on a regular basis? In addition to improving your physical health, being physically active on most days of the week can help you in ways that you may not expect. Benefits of regular physical activity may include:  Feeling good about your body.  Being able to move around more easily and for longer periods of time without getting tired (increased stamina).  Finding new sources of fun and enjoyment.  Meeting new people who share a common interest.  Being able to fight off  illness better (enhanced immunity).  Being able to sleep better.  What can happen if I am not physically active on a regular basis? Not getting enough physical activity can lead to an unhealthy lifestyle and future health problems. This can increase your chances of:  Becoming overweight or obese.  Becoming sick.  Developing chronic illnesses, like heart disease or diabetes.  Having mental health problems, like depression or anxiety.  Having sleep problems.  Having trouble walking or getting yourself around (reduced mobility).  Injuring yourself in a fall as you get older.  What steps can I take to be more physically active?  Check with your health care provider about how to get started. Ask your health care provider what activities are safe for you.  Start out slowly. Walking or doing some simple chair exercises is a good place to start, especially if you have not been active before or for a long time.  Try to find activities that you enjoy. You are more likely to commit to an exercise routine if it does not feel like a chore.  If you have bone or joint problems, choose low-impact exercises, like walking or swimming.  Include physical activity in your everyday routine.  Invite friends or family members to exercise with you. This also will help you commit to your workout plan.  Set goals that you can work toward.  Aim for at least 150 minutes of moderate-intensity exercise each week. Examples of moderate-intensity exercise include walking or riding a bike. Where to find more information:  Centers for Disease Control and Prevention: [employer]is  President's [employer] on [employer], Sports & Nutrition www.http://villegas.org/  ChooseMyPlate: [employer] Contact a health care provider if:  You have headaches, muscle aches, or joint pain.  You feel dizzy or light-headed while exercising.  You faint.  You have  chest pain while exercising. Summary  Exercise benefits your mind and body at any age, even if you are just starting out.  If you have a chronic illness or have not been active for a while, check with your health care provider before increasing your physical activity.  Choose activities that are safe and enjoyable for you.Ask your health care provider what activities are safe for you.  Start slowly. Tell your health care provider if you have problems as you start to increase your activity level. This information is not intended to replace advice given to you by your health care provider. Make sure you discuss any questions you have with your health care provider. Document Released:  06/19/2016 Document Revised: 06/19/2016 Document Reviewed: 06/19/2016 Elsevier Interactive Patient Education  2018 [place] Years, Male Preventive care refers to lifestyle choices and visits with your health care provider that can promote health and wellness. What does preventive care include?  A yearly physical exam. This is also called an annual well check.  Dental exams once or twice a year.  Routine eye exams. Ask your health care provider how often you should have your eyes checked.  Personal lifestyle choices, including: ? Daily care of your teeth and gums. ? Regular physical activity. ? Eating a healthy diet. ? Avoiding tobacco and drug use. ? Limiting alcohol use. ? Practicing safe sex. ? Taking low-dose aspirin every day starting at age 54. What happens during an annual well check? The services and screenings done by your health care provider during your annual well check will depend on your age, overall health, lifestyle risk factors, and family history of disease. Counseling Your health care provider may ask you questions about your:  Alcohol use.  Tobacco use.  Drug use.  Emotional well-being.  Home and relationship well-being.  Sexual  activity.  Eating habits.  Work and work Statistician.  Screening You may have the following tests or measurements:  Height, weight, and BMI.  Blood pressure.  Lipid and cholesterol levels. These may be checked every 5 years, or more frequently if you are over 43 years old.  Skin check.  Lung cancer screening. You may have this screening every year starting at age 68 if you have a 30-pack-year history of smoking and currently smoke or have quit within the past 15 years.  Fecal occult blood test (FOBT) of the stool. You may have this test every year starting at age 67.  Flexible sigmoidoscopy or colonoscopy. You may have a sigmoidoscopy every 5 years or a colonoscopy every 10 years starting at age 46.  Prostate cancer screening. Recommendations will vary depending on your family history and other risks.  Hepatitis C blood test.  Hepatitis B blood test.  Sexually transmitted disease (STD) testing.  Diabetes screening. This is done by checking your blood sugar (glucose) after you have not eaten for a while (fasting). You may have this done every 1-3 years.  Discuss your test results, treatment options, and if necessary, the need for more tests with your health care provider. Vaccines Your health care provider may recommend certain vaccines, such as:  Influenza vaccine. This is recommended every year.  Tetanus, diphtheria, and acellular pertussis (Tdap, Td) vaccine. You may need a Td booster every 10 years.  Varicella vaccine. You may need this if you have not been vaccinated.  Zoster vaccine. You may need this after age 50.  Measles, mumps, and rubella (MMR) vaccine. You may need at least one dose of MMR if you were born in 1957 or later. You may also need a second dose.  Pneumococcal 13-valent conjugate (PCV13) vaccine. You may need this if you have certain conditions and have not been vaccinated.  Pneumococcal polysaccharide (PPSV23) vaccine. You may need one or two  doses if you smoke cigarettes or if you have certain conditions.  Meningococcal vaccine. You may need this if you have certain conditions.  Hepatitis A vaccine. You may need this if you have certain conditions or if you travel or work in places where you may be exposed to hepatitis A.  Hepatitis B vaccine. You may need this if you have certain conditions or if you travel or work  in places where you may be exposed to hepatitis B.  Haemophilus influenzae type b (Hib) vaccine. You may need this if you have certain risk factors.  Talk to your health care provider about which screenings and vaccines you need and how often you need them. This information is not intended to replace advice given to you by your health care provider. Make sure you discuss any questions you have with your health care provider. Document Released: 07/27/2015 Document Revised: 03/19/2016 Document Reviewed: 05/01/2015 Elsevier Interactive Patient Education  [employer].

## 2017-11-16 NOTE — Progress Notes (Addendum)
Subjective:  Patient ID: Peter Oconnor, male    DOB: 1960-10-09  Age: 57 y.o. MRN: [id number]  CC: Annual Exam   HPI Peter Oconnor presents for a physical exam.  He has been doing well.  His knee is doing better and he has  been able to exercise by walking.  He plans on using a elliptical as well.  He has been to [facility] and has been following the diet that he learned at that facility.  He is never a colonoscopy and is interested in pursuing that at this time.  He is seeing cardiology due to his LAD lesion with stenting.  Blood work checked by cardiology was reviewed.  His blood sugar was slightly elevated.  History of OSA and he does use his CPAP nightly.  Urine flow is good without nocturia.  He is having problems with ED and he asks about some of the medicines he is taking as possible causes.  He has not used nitrates in over 4 years.  Outpatient Medications Prior to Visit  Medication Sig Dispense Refill  . aspirin 81 MG tablet Take 81 mg by mouth daily.    . Coenzyme Q10 (CO Q-10) 200 MG CAPS Take 1 capsule by mouth daily.    Marland Kitchen lisinopril (PRINIVIL,ZESTRIL) 5 MG tablet Take 1 tablet (5 mg total) by mouth daily. 90 tablet 3  . metoprolol tartrate (LOPRESSOR) 25 MG tablet Take 1 tablet (25 mg total) by mouth 2 (two) times daily. 180 tablet 3  . rosuvastatin (CRESTOR) 40 MG tablet Take 1 tablet (40 mg total) by mouth daily. 90 tablet 3  . ticagrelor (BRILINTA) 60 MG TABS tablet Take 1 tablet (60 mg total) by mouth 2 (two) times daily. 180 tablet 3   No facility-administered medications prior to visit.     ROS Review of Systems  Constitutional: Negative.  Negative for chills, fatigue, fever and unexpected weight change.  HENT: Negative.   Eyes: Negative.   Respiratory: Negative.   Cardiovascular: Negative.  Negative for chest pain and palpitations.  Gastrointestinal: Negative.   Endocrine: Negative for polyphagia and polyuria.  Genitourinary: Negative for decreased urine volume,  difficulty urinating and hematuria.  Musculoskeletal: Negative for gait problem.  Skin: Negative for color change and rash.  Allergic/Immunologic: Negative for immunocompromised state.  Neurological: Negative for weakness and headaches.  Hematological: Does not bruise/bleed easily.  Psychiatric/Behavioral: Negative.     Objective:  BP 124/80   Pulse 66   Ht 5\' 11"  (1.803 m)   Wt 269 lb 4 oz (122.1 kg)   SpO2 96%   BMI 37.55 kg/m   BP Readings from Last 3 Encounters:  11/16/17 124/80  11/09/17 118/70  10/02/17 134/72    Wt Readings from Last 3 Encounters:  11/16/17 269 lb 4 oz (122.1 kg)  11/09/17 272 lb (123.4 kg)  10/02/17 271 lb (122.9 kg)    Physical Exam  Constitutional: He is oriented to person, place, and time. He appears well-developed and well-nourished. No distress.  HENT:  Head: Normocephalic and atraumatic.  Right Ear: External ear normal.  Left Ear: External ear normal.  Nose: Nose normal.  Mouth/Throat: Oropharynx is clear and moist. No oropharyngeal exudate.  Eyes: Pupils are equal, round, and reactive to light. Conjunctivae and EOM are normal. Right eye exhibits no discharge. Left eye exhibits no discharge. No scleral icterus.  Neck: Normal range of motion. Neck supple. No JVD present. No tracheal deviation present. No thyromegaly present.  Cardiovascular: Normal rate, regular rhythm and  normal heart sounds.  Pulmonary/Chest: Effort normal and breath sounds normal.  Abdominal: Soft. Bowel sounds are normal. He exhibits distension. He exhibits no mass. There is no tenderness. There is no rebound and no guarding. A hernia is present. Hernia confirmed positive in the ventral area.  Genitourinary: Rectal exam shows no external hemorrhoid, no internal hemorrhoid, no fissure, no mass, no tenderness, anal tone normal and guaiac negative stool. Prostate is not enlarged and not tender.  Musculoskeletal: He exhibits no edema.  Lymphadenopathy:    He has no cervical  adenopathy.  Neurological: He is alert and oriented to person, place, and time.  Skin: Skin is warm and dry. Capillary refill takes less than 2 seconds. No rash noted. He is not diaphoretic. No erythema. No pallor.  Psychiatric: He has a normal mood and affect. His behavior is normal.    Lab Results  Component Value Date   WBC 5.4 11/16/2017   HGB 14.7 11/16/2017   HCT 43.2 11/16/2017   PLT 231.0 11/16/2017   GLUCOSE 101 (H) 11/09/2017   CHOL 149 11/09/2017   TRIG 81 11/09/2017   HDL 45 11/09/2017   LDLCALC 88 11/09/2017   ALT 46 (H) 11/09/2017   AST 29 11/09/2017   NA 139 11/09/2017   K 4.4 11/09/2017   CL 101 11/09/2017   CREATININE 0.94 11/09/2017   BUN 18 11/09/2017   CO2 22 11/09/2017   HGBA1C 5.7 11/16/2017    Patient was never admitted.  Assessment & Plan:   Peter Oconnor was seen today for annual exam.  Diagnoses and all orders for this visit:  Healthcare maintenance -     CBC -     HIV antibody -     Urinalysis, Routine w reflex microscopic  Erectile dysfunction due to arterial insufficiency -     Discontinue: sildenafil (REVATIO) 20 MG tablet; Take 1-3 pills 45 minutes prior to intercourse as needed daily. -     sildenafil (REVATIO) 20 MG tablet; Take 1-3 pills 45 minutes prior to intercourse as needed daily. Must not use with nitrates. -     PSA  Encounter for hepatitis C screening test for low risk patient -     Hepatitis C antibody  Elevated blood sugar -     Hemoglobin A1c  Screen for colon cancer -     Ambulatory referral to Gastroenterology   I have changed Peter Hake "Rob"'s sildenafil. I am also having him maintain his aspirin, Co Q-10, ticagrelor, lisinopril, metoprolol tartrate, and rosuvastatin.  Meds ordered this encounter  Medications  . DISCONTD: sildenafil (REVATIO) 20 MG tablet    Sig: Take 1-3 pills 45 minutes prior to intercourse as needed daily.    Dispense:  50 tablet    Refill:  0  . sildenafil (REVATIO) 20 MG tablet    Sig:  Take 1-3 pills 45 minutes prior to intercourse as needed daily. Must not use with nitrates.    Dispense:  50 tablet    Refill:  0   Encouraged him to continue exercise and pursue a sustainable diet.  Anticipatory guidance with regards to health maintenance and prevention was given to him.  He was given a trial of Revatio with warnings about not combining it with nitrates.  Discussed the various possible etiologies of the ED for him.  Follow-up: Return in about 6 months (around 05/19/2018).  Libby Maw, MD

## 2017-11-17 ENCOUNTER — Telehealth: Payer: Self-pay

## 2017-11-17 NOTE — Telephone Encounter (Signed)
Add- on PSA lab called in to [facility]. Representative ext [phone]  -[facility]

## 2017-11-17 NOTE — Addendum Note (Signed)
Addended by: Lynnea Ferrier on: 11/17/2017 04:17 PM   Modules accepted: Orders

## 2017-11-17 NOTE — Addendum Note (Signed)
Addended by: Jon Billings on: 11/17/2017 04:21 PM   Modules accepted: Orders

## 2017-11-18 LAB — HEPATITIS C ANTIBODY
Hepatitis C Ab: NONREACTIVE
SIGNAL TO CUT-OFF: 0.01 (ref <1.00)

## 2017-11-18 LAB — TEST AUTHORIZATION

## 2017-11-18 LAB — HIV ANTIBODY (ROUTINE TESTING W REFLEX): HIV: NONREACTIVE

## 2017-11-18 LAB — PSA: PSA: 1.5 ng/mL (ref <=4.0)

## 2018-01-04 ENCOUNTER — Telehealth: Payer: Self-pay | Admitting: Gastroenterology

## 2018-01-04 ENCOUNTER — Ambulatory Visit: Payer: BLUE CROSS/BLUE SHIELD | Admitting: Gastroenterology

## 2018-01-12 DIAGNOSIS — Z7982 Long term (current) use of aspirin: Secondary | ICD-10-CM | POA: Diagnosis not present

## 2018-01-12 DIAGNOSIS — Z79899 Other long term (current) drug therapy: Secondary | ICD-10-CM | POA: Diagnosis not present

## 2018-01-12 DIAGNOSIS — S0990XA Unspecified injury of head, initial encounter: Secondary | ICD-10-CM | POA: Diagnosis not present

## 2018-01-12 DIAGNOSIS — I252 Old myocardial infarction: Secondary | ICD-10-CM | POA: Diagnosis not present

## 2018-01-12 DIAGNOSIS — S0101XA Laceration without foreign body of scalp, initial encounter: Secondary | ICD-10-CM | POA: Diagnosis not present

## 2018-01-22 ENCOUNTER — Encounter: Payer: Self-pay | Admitting: Family Medicine

## 2018-01-22 ENCOUNTER — Ambulatory Visit (INDEPENDENT_AMBULATORY_CARE_PROVIDER_SITE_OTHER): Payer: BLUE CROSS/BLUE SHIELD | Admitting: Family Medicine

## 2018-01-22 VITALS — BP 110/70 | HR 63 | Temp 98.2°F | Ht 71.0 in | Wt 280.2 lb

## 2018-01-22 DIAGNOSIS — R42 Dizziness and giddiness: Secondary | ICD-10-CM

## 2018-01-22 DIAGNOSIS — Z4802 Encounter for removal of sutures: Secondary | ICD-10-CM

## 2018-01-22 NOTE — Patient Instructions (Signed)
Hypotension Hypotension, commonly called low blood pressure, is when the force of blood pumping through your arteries is too weak. Arteries are blood vessels that carry blood from the heart throughout the body. When blood pressure is too low, you may not get enough blood to your brain or to the rest of your organs. This can cause weakness, light-headedness, rapid heartbeat, and fainting. Depending on the cause and severity, hypotension may be harmless (benign) or cause serious problems (critical). What are the causes? Possible causes of hypotension include:  Blood loss.  Loss of body fluids (dehydration).  Heart problems.  Hormone (endocrine) problems.  Pregnancy.  Severe infection.  Lack of certain nutrients.  Severe allergic reactions (anaphylaxis).  Certain medicines, such as blood pressure medicine or medicines that make the body lose excess fluids (diuretics). Sometimes, hypotension can be caused by not taking medicine as directed, such as taking too much of a certain medicine.  What increases the risk? Certain factors can make you more likely to develop hypotension, including:  Age. Risk increases as you get older.  Conditions that affect the heart or the central nervous system.  Taking certain medicines, such as blood pressure medicine or diuretics.  Being pregnant.  What are the signs or symptoms? Symptoms of this condition may include:  Weakness.  Light-headedness.  Dizziness.  Blurred vision.  Fatigue.  Rapid heartbeat.  Fainting, in severe cases.  How is this diagnosed? This condition is diagnosed based on:  Your medical history.  Your symptoms.  Your blood pressure measurement. Your health care provider will check your blood pressure when you are: ? Lying down. ? Sitting. ? Standing.  A blood pressure reading is recorded as two numbers, such as "120 over 80" (or 120/80). The first ("top") number is called the systolic pressure. It is a  measure of the pressure in your arteries as your heart beats. The second ("bottom") number is called the diastolic pressure. It is a measure of the pressure in your arteries when your heart relaxes between beats. Blood pressure is measured in a unit called mm Hg. Healthy blood pressure for adults is 120/80. If your blood pressure is below 90/60, you may be diagnosed with hypotension. Other information or tests that may be used to diagnose hypotension include:  Your other vital signs, such as your heart rate and temperature.  Blood tests.  Tilt table test. For this test, you will be safely secured to a table that moves you from a lying position to an upright position. Your heart rhythm and blood pressure will be monitored during the test.  How is this treated? Treatment for this condition may include:  Changing your diet. This may involve eating more salt (sodium) or drinking more water.  Taking medicines to raise your blood pressure.  Changing the dosage of certain medicines you are taking that might be lowering your blood pressure.  Wearing compression stockings. These stockings help to prevent blood clots and reduce swelling in your legs.  In some cases, you may need to go to the hospital for:  Fluid replacement. This means you will receive fluids through an IV tube.  Blood replacement. This means you will receive donated blood through an IV tube (transfusion).  Treating an infection or heart problems, if this applies.  Monitoring. You may need to be monitored while medicines that you are taking wear off.  Follow these instructions at home: Eating and drinking   Drink enough fluid to keep your urine clear or pale yellow.    Eat a healthy diet and follow instructions from your health care provider about eating or drinking restrictions. A healthy diet includes: ? Fresh fruits and vegetables. ? Whole grains. ? Lean meats. ? Low-fat dairy products.  Eat extra salt only as  directed. Do not add extra salt to your diet unless your health care provider told you to do that.  Eat frequent, small meals.  Avoid standing up suddenly after eating. Medicines  Take over-the-counter and prescription medicines only as told by your health care provider. ? Follow instructions from your health care provider about changing the dosage of your current medicines, if this applies. ? Do not stop or adjust any of your medicines on your own. General instructions  Wear compression stockings as told by your health care provider.  Get up slowly from lying down or sitting positions. This gives your blood pressure a chance to adjust.  Avoid hot showers and excessive heat as directed by your health care provider.  Return to your normal activities as told by your health care provider. Ask your health care provider what activities are safe for you.  Do not use any products that contain nicotine or tobacco, such as cigarettes and e-cigarettes. If you need help quitting, ask your health care provider.  Keep all follow-up visits as told by your health care provider. This is important. Contact a health care provider if:  You vomit.  You have diarrhea.  You have a fever for more than 2-3 days.  You feel more thirsty than usual.  You feel weak and tired. Get help right away if:  You have chest pain.  You have a fast or irregular heartbeat.  You develop numbness in any part of your body.  You cannot move your arms or your legs.  You have trouble speaking.  You become sweaty or feel light-headed.  You faint.  You feel short of breath.  You have trouble staying awake.  You feel confused. This information is not intended to replace advice given to you by your health care provider. Make sure you discuss any questions you have with your health care provider. Document Released: 06/30/2005 Document Revised: 01/18/2016 Document Reviewed: 12/20/2015 Elsevier Interactive  Patient Education  2018 Elsevier Inc.  

## 2018-01-22 NOTE — Progress Notes (Signed)
Subjective:  Patient ID: Peter Oconnor, male    DOB: 06/01/1961  Age: 57 y.o. MRN: [id number]  CC: Suture / Staple Removal   HPI Peter Oconnor presents for removal of sutures.  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  He was in the strange place and woke up in the wee hours, stood up and became lightheaded fell and struck his head on the bedside table.  He went to the emergency room sutures were placed in his scalp laceration and he tells me that a CT scan of his head was negative for subdural hematoma.  He has had some other episodes of lightheadedness with standing.  He takes metoprolol twice daily and takes the lisinopril at night.  Past medical history of CAD status post stent placement 4 years ago.  He remains on Brilinta.  Outpatient Medications Prior to Visit  Medication Sig Dispense Refill  . aspirin 81 MG tablet Take 81 mg by mouth daily.    . Coenzyme Q10 (CO Q-10) 200 MG CAPS Take 1 capsule by mouth daily.    Marland Kitchen lisinopril (PRINIVIL,ZESTRIL) 5 MG tablet Take 1 tablet (5 mg total) by mouth daily. 90 tablet 3  . metoprolol tartrate (LOPRESSOR) 25 MG tablet Take 1 tablet (25 mg total) by mouth 2 (two) times daily. 180 tablet 3  . rosuvastatin (CRESTOR) 40 MG tablet Take 1 tablet (40 mg total) by mouth daily. 90 tablet 3  . sildenafil (REVATIO) 20 MG tablet Take 1-3 pills 45 minutes prior to intercourse as needed daily. Must not use with nitrates. 50 tablet 0  . ticagrelor (BRILINTA) 60 MG TABS tablet Take 1 tablet (60 mg total) by mouth 2 (two) times daily. 180 tablet 3   No facility-administered medications prior to visit.     ROS Review of Systems  Constitutional: Negative.   HENT: Negative.   Eyes: Negative.   Respiratory: Negative.   Cardiovascular: Negative.   Gastrointestinal: Negative.   Endocrine: Negative for polyphagia and polyuria.  Genitourinary: Negative.   Skin: Positive for wound.  Allergic/Immunologic: Negative  for immunocompromised state.  Neurological: Positive for light-headedness. Negative for dizziness and headaches.  Hematological: Bruises/bleeds easily.  Psychiatric/Behavioral: Negative.     Objective:  BP 110/70   Pulse 63   Temp 98.2 F (36.8 C)   Ht 5\' 11"  (1.803 m)   Wt 280 lb 4 oz (127.1 kg)   SpO2 97%   BMI 39.09 kg/m   BP Readings from Last 3 Encounters:  01/22/18 110/70  11/16/17 124/80  11/09/17 118/70    Wt Readings from Last 3 Encounters:  01/22/18 280 lb 4 oz (127.1 kg)  11/16/17 269 lb 4 oz (122.1 kg)  11/09/17 272 lb (123.4 kg)    Physical Exam  Constitutional: He is oriented to person, place, and time. He appears well-developed and well-nourished. No distress.  HENT:  Head: Normocephalic and atraumatic.  Right Ear: External ear normal.  Left Ear: External ear normal.  Eyes: Right eye exhibits no discharge. Left eye exhibits no discharge.  Neck: No JVD present. No tracheal deviation present.  Cardiovascular: Normal rate, regular rhythm and normal heart sounds.  Pulmonary/Chest: Breath sounds normal.  Neurological: He is alert and oriented to person, place, and time.  Skin: Skin is warm and dry. Capillary refill takes less than 2 seconds. He is not diaphoretic.     Psychiatric: He has a normal mood and affect. His behavior is normal.  Lab Results  Component Value Date   WBC 5.4 11/16/2017   HGB 14.7 11/16/2017   HCT 43.2 11/16/2017   PLT 231.0 11/16/2017   GLUCOSE 101 (H) 11/09/2017   CHOL 149 11/09/2017   TRIG 81 11/09/2017   HDL 45 11/09/2017   LDLCALC 88 11/09/2017   ALT 46 (H) 11/09/2017   AST 29 11/09/2017   NA 139 11/09/2017   K 4.4 11/09/2017   CL 101 11/09/2017   CREATININE 0.94 11/09/2017   BUN 18 11/09/2017   CO2 22 11/09/2017   PSA 1.5 11/16/2017   HGBA1C 5.7 11/16/2017    Patient was never admitted.  Assessment & Plan:   Peter Oconnor was seen today for suture / staple removal.  Diagnoses and all orders for this  visit:  Encounter for removal of sutures  Light headed   I am having Peter Hake "Rob" maintain his aspirin, Co Q-10, ticagrelor, lisinopril, metoprolol tartrate, rosuvastatin, and sildenafil.  No orders of the defined types were placed in this encounter.  Orthostatics were normal for patient.  He will start checking his blood pressure with future episodes of lightheadedness.  Follow-up in 3 months or sooner if his blood pressure continues to run less than 100 over less than 70.  Follow-up: Return in about 3 months (around 04/24/2018), or if symptoms worsen or fail to improve.  Libby Maw, MD

## 2018-02-02 ENCOUNTER — Other Ambulatory Visit: Payer: BLUE CROSS/BLUE SHIELD

## 2018-04-01 DIAGNOSIS — G4733 Obstructive sleep apnea (adult) (pediatric): Secondary | ICD-10-CM | POA: Diagnosis not present

## 2018-05-19 ENCOUNTER — Ambulatory Visit (INDEPENDENT_AMBULATORY_CARE_PROVIDER_SITE_OTHER): Payer: BLUE CROSS/BLUE SHIELD | Admitting: Nurse Practitioner

## 2018-05-19 ENCOUNTER — Encounter: Payer: Self-pay | Admitting: Nurse Practitioner

## 2018-05-19 ENCOUNTER — Telehealth: Payer: Self-pay

## 2018-05-19 VITALS — BP 118/72 | HR 64 | Ht 71.0 in | Wt 260.2 lb

## 2018-05-19 DIAGNOSIS — Z7901 Long term (current) use of anticoagulants: Secondary | ICD-10-CM

## 2018-05-19 DIAGNOSIS — Z1211 Encounter for screening for malignant neoplasm of colon: Secondary | ICD-10-CM

## 2018-05-19 MED ORDER — NA SULFATE-K SULFATE-MG SULF 17.5-3.13-1.6 GM/177ML PO SOLN: ORAL | 0 refills | Status: DC

## 2018-05-19 NOTE — Progress Notes (Signed)
ASSESSMENT  / PLAN:    49. 57 year old male here for initial colon cancer screening.  No alarm sx. No [place] of CRC. -The risks and benefits of colonoscopy with possible polypectomy were discussed and the patient agrees to proceed.   2. Chronic anticoagulation. On Brillinta for CAD / stenting in 2015.  - Hold Brillinta for 5 days before procedure - will instruct when and how to resume after procedure. Patient understands that there is a low but real risk of cardiovascular event such as heart attack, stroke, or embolism /  thrombosis, or ischemia while off Brillinta. The patient consents to proceed. Will communicate by phone or EMR with patient's prescribing provider to confirm that holding Brillinta is reasonable in this case.    HPI:    Patient is a 57 year old male with history of hypertension, obesity, OSA, CAD/MI in 2015. He is referred by his PCP, Dr. Ethelene Hal for colon cancer screening.  Patient has never had a colonoscopy.  He has no GI complaints.  Specifically, he has no blood in stool, bowel changes, unexpected weight loss, or abdominal pain.  No family history colon cancer.  Regarding cardiac history, patient has no complaints of chest pain or shortness of breath. He is followed by Dr. Radford Pax.  Echocardiogram July 2015 showed EF of 60 to 65%   Chief Complaint:   Colon cancer screening  Past Medical History:  Diagnosis Date  . 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    Marland Kitchen Chicken pox   . Hyperlipidemia with target LDL less than 70 10/19/2015  . MI, old   . Obesity (BMI 30-39.9) 10/19/2015  . OSA (obstructive sleep apnea) 10/19/2015     Past Surgical History:  Procedure Laterality Date  . CARDIAC CATHETERIZATION    . CORONARY ANGIOPLASTY  01/25/2014   PCI of LCx and LAD  . KNEE SURGERY    . NASAL  SINUS SURGERY     Family History  Problem Relation Age of Onset  . Hyperlipidemia Mother   . Leukemia Father   . Arthritis Father   . Heart attack Maternal Grandfather   . Heart disease Maternal Grandfather   . Hyperlipidemia Maternal Grandfather   . Stroke Maternal Grandfather   . Hyperlipidemia Maternal Grandmother   . Colon cancer Neg Hx   . Liver cancer Neg Hx   . Stomach cancer Neg Hx   . Rectal cancer Neg Hx   . Esophageal cancer Neg Hx    Social History   Tobacco Use  . Smoking status: Former Research scientist (life sciences)  . Smokeless tobacco: Never Used  Substance Use Topics  . Alcohol use: Yes    Comment: occasionally  . Drug use: No   Current Outpatient Medications  Medication Sig Dispense Refill  . aspirin 81 MG tablet Take 81 mg by mouth daily.    . Coenzyme Q10 (CO Q-10) 200 MG CAPS Take 1 capsule by mouth daily.    Marland Kitchen lisinopril (PRINIVIL,ZESTRIL) 5 MG tablet Take 1 tablet (5 mg total) by mouth daily. 90 tablet 3  . metoprolol tartrate (LOPRESSOR) 25 MG tablet Take 1 tablet (25 mg total) by mouth 2 (two) times daily. 180 tablet 3  . rosuvastatin (CRESTOR) 40 MG tablet Take 1 tablet (  40 mg total) by mouth daily. 90 tablet 3  . sildenafil (REVATIO) 20 MG tablet Take 1-3 pills 45 minutes prior to intercourse as needed daily. Must not use with nitrates. 50 tablet 0  . ticagrelor (BRILINTA) 60 MG TABS tablet Take 1 tablet (60 mg total) by mouth 2 (two) times daily. 180 tablet 3   No current facility-administered medications for this visit.    Allergies  Allergen Reactions  . Penicillins Rash    Also itching     Review of Systems: All systems reviewed and negative except where noted in HPI.   Creatinine clearance cannot be calculated (Patient's most recent lab result is older than the maximum 21 days allowed.)   Physical Exam:    Wt Readings from Last 3 Encounters:  05/19/18 260 lb 4 oz (118 kg)  01/22/18 280 lb 4 oz (127.1 kg)  11/16/17 269 lb 4 oz (122.1 kg)    Ht 5'  11" (1.803 m)   Wt 260 lb 4 oz (118 kg)   BMI 36.30 kg/m  Constitutional:  Pleasant male in no acute distress. Psychiatric: Normal mood and affect. Behavior is normal. EENT: Pupils normal.  Conjunctivae are normal. No scleral icterus. Neck supple.  Cardiovascular: Normal rate, regular rhythm. No edema Pulmonary/chest: Effort normal and breath sounds normal. No wheezing, rales or rhonchi. Abdominal: Soft, nondistended, nontender. Bowel sounds active throughout. There are no masses palpable. No hepatomegaly. Neurological: Alert and oriented to person place and time. Skin: Skin is warm and dry. No rashes noted.  Tye Savoy, NP  05/19/2018, 8:49 AM  Cc:  Libby Maw

## 2018-05-19 NOTE — Telephone Encounter (Signed)
[facility] Pre-operative Risk Assessment     Request for surgical clearance:     Endoscopy Procedure  What type of surgery is being performed?     Colonoscopy  When is this surgery scheduled?     06/21/18  What type of clearance is required ?   Pharmacy  Are there any medications that need to be held prior to surgery and how long? HOLD BRILLINTA 5 DAYS PRIOR  Practice name and name of physician performing surgery?      [facility]/ Dr. Tarri Glenn  What is your office phone and fax number?      Phone- [phone]  [phone]  Anesthesia type (None, local, MAC, general) ?       MAC

## 2018-05-19 NOTE — Patient Instructions (Signed)
If you are age 55 or older, your body mass index should be between 23-30. Your Body mass index is 36.3 kg/m. If this is out of the aforementioned range listed, please consider follow up with your Primary Care Provider.  If you are age 40 or younger, your body mass index should be between 19-25. Your Body mass index is 36.3 kg/m. If this is out of the aformentioned range listed, please consider follow up with your Primary Care Provider.   You have been scheduled for a colonoscopy. Please follow written instructions given to you at your visit today.  Please pick up your prep supplies at the pharmacy within the next 1-3 days. If you use inhalers (even only as needed), please bring them with you on the day of your procedure. Your physician has requested that you go to www.startemmi.com and enter the access code given to you at your visit today. This web site gives a general overview about your procedure. However, you should still follow specific instructions given to you by our office regarding your preparation for the procedure.  We have sent the following medications to your pharmacy for you to pick up at your convenience: [place] will be contacted by our office prior to your procedure for directions on holding your Brilinta.  If you do not hear from our office 1 week prior to your scheduled procedure, please call [phone] to discuss.   Thank you for choosing me and [facility].   Tye Savoy, NP

## 2018-05-21 NOTE — Telephone Encounter (Signed)
   Primary Cardiologist: Fransico Him, MD  Chart reviewed as part of pre-operative protocol coverage. Patient was contacted 05/21/2018 in reference to pre-operative risk assessment for pending surgery as outlined below.  Peter Oconnor was last seen on 10/2017 by Dr. Radford Pax. He has h/o CAD s/panterior MI in 7/2015with PCI, HTN, obesity, OSA. Echo 2015 EF 60-65%. Revised cardiac risk index is 0.9% indicating low risk of CV complications. I spoke with the patient who affirms he is doing well without any new anginal symptoms. No CP, SOB, syncope. He recently opened up a smoothie shop and has been on the go for the past 7 weeks without any functional limitation from cardiac standpoint. Therefore, based on ACC/AHA guidelines, the patient would be at acceptable risk for the planned procedure without further cardiovascular testing.   I discussed with Dr. Radford Pax who feels the patient is OK to hold Brilinta for 5 days as requested.  I will route this recommendation to the requesting party via Epic fax function and remove from pre-op pool.  Please call with questions.  I will also cc to [place], [place] Peter [place], do you prefer we also send these back to you via message in Epic instead of fax? Since there are so many different APPs covering the pool,  if you would rather [place] send back to you directly, can you update your template to include where to route back to?  Otherwise we will continue to fax as usual - just let us know. Thanks!  Charlie Pitter, PA-C 05/21/2018, 9:10 AM

## 2018-05-24 ENCOUNTER — Telehealth: Payer: Self-pay

## 2018-05-24 NOTE — Telephone Encounter (Signed)
Spoke with patient this morning regarding HOLDING Brilinta for five days prior to procedure; okay per Sharrell Ku, PA-C.  Patient verbalized understanding.

## 2018-05-24 NOTE — Progress Notes (Signed)
Reviewed and agree with documentation and assessment and plan. K. Veena Verania Salberg , MD   

## 2018-06-21 ENCOUNTER — Ambulatory Visit (AMBULATORY_SURGERY_CENTER): Payer: BLUE CROSS/BLUE SHIELD | Admitting: Gastroenterology

## 2018-06-21 ENCOUNTER — Encounter: Payer: Self-pay | Admitting: Gastroenterology

## 2018-06-21 VITALS — BP 113/73 | HR 60 | Temp 98.4°F | Resp 12 | Ht 71.0 in | Wt 260.0 lb

## 2018-06-21 DIAGNOSIS — Z1211 Encounter for screening for malignant neoplasm of colon: Secondary | ICD-10-CM | POA: Diagnosis not present

## 2018-06-21 DIAGNOSIS — D120 Benign neoplasm of cecum: Secondary | ICD-10-CM

## 2018-06-21 DIAGNOSIS — D122 Benign neoplasm of ascending colon: Secondary | ICD-10-CM

## 2018-06-21 MED ORDER — SODIUM CHLORIDE 0.9 % IV SOLN: 500.0000 mL | Freq: Once | INTRAVENOUS | Status: AC

## 2018-06-21 NOTE — Progress Notes (Signed)
Called to room to assist during endoscopic procedure.  Patient ID and intended procedure confirmed with present staff. Received instructions for my participation in the procedure from the performing physician.  

## 2018-06-21 NOTE — Patient Instructions (Addendum)
YOU HAD AN ENDOSCOPIC PROCEDURE TODAY AT [facility]:   Refer to the procedure report that was given to you for any specific questions about what was found during the examination.  If the procedure report does not answer your questions, please call your gastroenterologist to clarify.  If you requested that your care partner not be given the details of your procedure findings, then the procedure report has been included in a sealed envelope for you to review at your convenience later.  YOU SHOULD EXPECT: Some feelings of bloating in the abdomen. Passage of more gas than usual.  Walking can help get rid of the air that was put into your GI tract during the procedure and reduce the bloating. If you had a lower endoscopy (such as a colonoscopy or flexible sigmoidoscopy) you may notice spotting of blood in your stool or on the toilet paper. If you underwent a bowel prep for your procedure, you may not have a normal bowel movement for a few days.  Please Note:  You might notice some irritation and congestion in your nose or some drainage.  This is from the oxygen used during your procedure.  There is no need for concern and it should clear up in a day or so.  SYMPTOMS TO REPORT IMMEDIATELY:   Following lower endoscopy (colonoscopy or flexible sigmoidoscopy):  Excessive amounts of blood in the stool  Significant tenderness or worsening of abdominal pains  Swelling of the abdomen that is new, acute  Fever of 100F or higher  For urgent or emergent issues, a gastroenterologist can be reached at any hour by calling [phone].   DIET:  We do recommend a small meal at first, but then you may proceed to your regular diet.  Drink plenty of fluids but you should avoid alcoholic beverages for 24 hours.  ACTIVITY:  You should plan to take it easy for the rest of today and you should NOT DRIVE or use heavy machinery until tomorrow (because of the sedation medicines used during the test).     FOLLOW UP: Our staff will call the number listed on your records the next business day following your procedure to check on you and address any questions or concerns that you may have regarding the information given to you following your procedure. If we do not reach you, we will leave a message.  However, if you are feeling well and you are not experiencing any problems, there is no need to return our call.  We will assume that you have returned to your regular daily activities without incident.  If any biopsies were taken you will be contacted by phone or by letter within the next 1-3 weeks.  Please call us at [phone] if you have not heard about the biopsies in 3 weeks.   Await for biopsy results Polyp (handout given) Repeat next screening Colonoscopy in 5 years Resume Brillinta today   SIGNATURES/CONFIDENTIALITY: You and/or your care partner have signed paperwork which will be entered into your electronic medical record.  These signatures attest to the fact that that the information above on your After Visit Summary has been reviewed and is understood.  Full responsibility of the confidentiality of this discharge information lies with you and/or your care-partner.

## 2018-06-21 NOTE — Op Note (Addendum)
[facility] Patient Name: Peter Oconnor Procedure Date: 06/21/2018 2:12 PM MRN: [id number] Endoscopist: Thornton Park MD, MD Age: 57 Referring MD:  Date of Birth: 01-04-1961 Gender: Male Account #: [id number] Procedure:                Colonoscopy Indications:              Screening for colorectal malignant neoplasm, This                            is the patient's first colonoscopy. No known family                            history of colon cancer or polyps. Medicines:                See the Anesthesia note for documentation of the                            administered medications Procedure:                Pre-Anesthesia Assessment:                           - Prior to the procedure, a History and Physical                            was performed, and patient medications and                            allergies were reviewed. The patient's tolerance of                            previous anesthesia was also reviewed. The risks                            and benefits of the procedure and the sedation                            options and risks were discussed with the patient.                            All questions were answered, and informed consent                            was obtained. Prior Anticoagulants: The patient has                            taken Brilinta. ASA Grade Assessment: II - A                            patient with mild systemic disease. After reviewing                            the risks and benefits, the patient was deemed in  satisfactory condition to undergo the procedure.                           After obtaining informed consent, the colonoscope                            was passed under direct vision. Throughout the                            procedure, the patient's blood pressure, pulse, and                            oxygen saturations were monitored continuously. The                            Colonoscope  was introduced through the anus and                            advanced to the the terminal ileum, with                            identification of the appendiceal orifice and IC                            valve. The colonoscopy was performed without                            difficulty. The patient tolerated the procedure                            well. The quality of the bowel preparation was good. Scope In: 2:22:35 PM Scope Out: 2:36:56 PM Scope Withdrawal Time: 0 hours 9 minutes 57 seconds  Total Procedure Duration: 0 hours 14 minutes 21 seconds  Findings:                 The perianal and digital rectal examinations were                            normal.                           A 3 mm polyp was found in the cecum. The polyp was                            sessile. The polyp was removed with a cold snare.                            Resection and retrieval were complete.                           The exam was otherwise without abnormality on                            direct and retroflexion views. Complications:  No immediate complications. Estimated Blood Loss:     Estimated blood loss: none. Impression:               - One 3 mm polyp in the cecum, removed with a cold                            snare. Resected and retrieved.                           - The examination was otherwise normal on direct                            and retroflexion views. Recommendation:           - Discharge patient to home.                           - Resume regular diet today.                           - Continue present medications.                           - Await pathology results.                           - Repeat colonoscopy in 5 years for surveillance.                           - May resume Brilinta today. Thornton Park MD, MD 06/21/2018 2:44:04 PM This report has been signed electronically.

## 2018-06-21 NOTE — Progress Notes (Signed)
Report to PACU, RN, vss, BBS= Clear.  

## 2018-06-22 ENCOUNTER — Telehealth: Payer: Self-pay | Admitting: *Deleted

## 2018-06-22 NOTE — Telephone Encounter (Signed)
  Follow up Call-  Call back number 06/21/2018  Post procedure Call Back phone  # [phone]  Permission to leave phone message Yes  Some recent data might be hidden     Patient questions:  Do you have a fever, pain , or abdominal swelling? No. Pain Score  0 *  Have you tolerated food without any problems? Yes.    Have you been able to return to your normal activities? Yes.    Do you have any questions about your discharge instructions: Diet   No. Medications  No. Follow up visit  No.  Do you have questions or concerns about your Care? No.  Actions: * If pain score is 4 or above: No action needed, pain <4.

## 2018-06-25 ENCOUNTER — Encounter: Payer: Self-pay | Admitting: Gastroenterology

## 2018-11-05 ENCOUNTER — Telehealth: Payer: Self-pay | Admitting: Family Medicine

## 2018-11-05 NOTE — Telephone Encounter (Signed)
Called patient on behalf of Dr Ethelene Hal because they had originally talked about a follow up in October of 2019 but nothing was scheduled so I asked him if he would be interested in doing a follow up now with a virtual visit but he said he didn't feel like he needed one and would call back if need be.

## 2018-11-12 DIAGNOSIS — G4733 Obstructive sleep apnea (adult) (pediatric): Secondary | ICD-10-CM | POA: Diagnosis not present

## 2018-11-24 ENCOUNTER — Other Ambulatory Visit: Payer: Self-pay | Admitting: Cardiology

## 2019-01-01 ENCOUNTER — Other Ambulatory Visit: Payer: Self-pay | Admitting: Cardiology

## 2019-01-19 ENCOUNTER — Other Ambulatory Visit: Payer: Self-pay

## 2019-01-19 ENCOUNTER — Telehealth: Payer: Self-pay | Admitting: Cardiology

## 2019-01-19 MED ORDER — METOPROLOL TARTRATE 25 MG PO TABS: 25.0000 mg | ORAL_TABLET | Freq: Two times a day (BID) | ORAL | 0 refills | Status: DC

## 2019-01-19 MED ORDER — ROSUVASTATIN CALCIUM 40 MG PO TABS: 40.0000 mg | ORAL_TABLET | Freq: Every day | ORAL | 2 refills | Status: DC

## 2019-01-19 MED ORDER — TICAGRELOR 60 MG PO TABS: 60.0000 mg | ORAL_TABLET | Freq: Two times a day (BID) | ORAL | 0 refills | Status: DC

## 2019-01-19 MED ORDER — METOPROLOL TARTRATE 25 MG PO TABS: 25.0000 mg | ORAL_TABLET | Freq: Two times a day (BID) | ORAL | 2 refills | Status: DC

## 2019-01-19 MED ORDER — LISINOPRIL 5 MG PO TABS: 5.0000 mg | ORAL_TABLET | Freq: Every day | ORAL | 0 refills | Status: DC

## 2019-01-19 MED ORDER — TICAGRELOR 60 MG PO TABS: 60.0000 mg | ORAL_TABLET | Freq: Two times a day (BID) | ORAL | 2 refills | Status: DC

## 2019-01-19 MED ORDER — ROSUVASTATIN CALCIUM 40 MG PO TABS: 40.0000 mg | ORAL_TABLET | Freq: Every day | ORAL | 0 refills | Status: DC

## 2019-01-19 MED ORDER — LISINOPRIL 5 MG PO TABS: 5.0000 mg | ORAL_TABLET | Freq: Every day | ORAL | 2 refills | Status: DC

## 2019-01-19 NOTE — Telephone Encounter (Signed)
New Message     *STAT* If patient is at the pharmacy, call can be transferred to refill team.   1. Which medications need to be refilled? (please list name of each medication and dose if known) Metoprolol tartrate, Brilinta, lisinopril, Rosuvastatin  2. Which pharmacy/location (including street and city if local pharmacy) is medication to be sent to? [facility]  3. Do they need a 30 day or 90 day supply? 90 day supply

## 2019-01-19 NOTE — Telephone Encounter (Signed)
Adjusted the prescriptions to be 90 supply. Sent into pharmacy.

## 2019-03-10 ENCOUNTER — Emergency Department (HOSPITAL_BASED_OUTPATIENT_CLINIC_OR_DEPARTMENT_OTHER): Payer: BC Managed Care – PPO

## 2019-03-10 ENCOUNTER — Emergency Department (HOSPITAL_BASED_OUTPATIENT_CLINIC_OR_DEPARTMENT_OTHER)
Admission: EM | Admit: 2019-03-10 | Discharge: 2019-03-10 | Disposition: A | Discharge status: Home / Self Care | Payer: BC Managed Care – PPO | Attending: Emergency Medicine | Admitting: Emergency Medicine

## 2019-03-10 ENCOUNTER — Other Ambulatory Visit: Payer: Self-pay

## 2019-03-10 ENCOUNTER — Ambulatory Visit: Payer: BLUE CROSS/BLUE SHIELD | Admitting: Cardiology

## 2019-03-10 DIAGNOSIS — Z7982 Long term (current) use of aspirin: Secondary | ICD-10-CM | POA: Diagnosis not present

## 2019-03-10 DIAGNOSIS — I110 Hypertensive heart disease with heart failure: Secondary | ICD-10-CM | POA: Diagnosis not present

## 2019-03-10 DIAGNOSIS — Y999 Unspecified external cause status: Secondary | ICD-10-CM | POA: Diagnosis not present

## 2019-03-10 DIAGNOSIS — S43014A Anterior dislocation of right humerus, initial encounter: Secondary | ICD-10-CM

## 2019-03-10 DIAGNOSIS — S43004A Unspecified dislocation of right shoulder joint, initial encounter: Secondary | ICD-10-CM | POA: Diagnosis not present

## 2019-03-10 DIAGNOSIS — S43006A Unspecified dislocation of unspecified shoulder joint, initial encounter: Secondary | ICD-10-CM | POA: Diagnosis not present

## 2019-03-10 DIAGNOSIS — I509 Heart failure, unspecified: Secondary | ICD-10-CM | POA: Insufficient documentation

## 2019-03-10 DIAGNOSIS — Y9289 Other specified places as the place of occurrence of the external cause: Secondary | ICD-10-CM | POA: Diagnosis not present

## 2019-03-10 DIAGNOSIS — Z87891 Personal history of nicotine dependence: Secondary | ICD-10-CM | POA: Insufficient documentation

## 2019-03-10 DIAGNOSIS — Y939 Activity, unspecified: Secondary | ICD-10-CM | POA: Insufficient documentation

## 2019-03-10 DIAGNOSIS — Z79899 Other long term (current) drug therapy: Secondary | ICD-10-CM | POA: Diagnosis not present

## 2019-03-10 DIAGNOSIS — I2510 Atherosclerotic heart disease of native coronary artery without angina pectoris: Secondary | ICD-10-CM | POA: Insufficient documentation

## 2019-03-10 DIAGNOSIS — W010XXA Fall on same level from slipping, tripping and stumbling without subsequent striking against object, initial encounter: Secondary | ICD-10-CM | POA: Insufficient documentation

## 2019-03-10 DIAGNOSIS — S4991XA Unspecified injury of right shoulder and upper arm, initial encounter: Secondary | ICD-10-CM | POA: Diagnosis not present

## 2019-03-10 DIAGNOSIS — I252 Old myocardial infarction: Secondary | ICD-10-CM | POA: Insufficient documentation

## 2019-03-10 MED ORDER — HYDROMORPHONE HCL 1 MG/ML IJ SOLN
INTRAMUSCULAR | Status: AC
  Filled 2019-03-10: qty 1

## 2019-03-10 MED ORDER — MORPHINE SULFATE (PF) 4 MG/ML IV SOLN
4.0000 mg | Freq: Once | INTRAVENOUS | Status: AC
  Administered 2019-03-10: 4 mg via INTRAVENOUS
  Filled 2019-03-10: qty 1

## 2019-03-10 MED ORDER — HYDROMORPHONE HCL 1 MG/ML IJ SOLN
1.0000 mg | Freq: Once | INTRAMUSCULAR | Status: AC
  Administered 2019-03-10: 1 mg via INTRAVENOUS

## 2019-03-10 MED ORDER — PROPOFOL 10 MG/ML IV BOLUS
INTRAVENOUS | Status: AC | PRN
  Administered 2019-03-10: 40 mg via INTRAVENOUS
  Administered 2019-03-10: 20 mg via INTRAVENOUS

## 2019-03-10 MED ORDER — PROPOFOL 10 MG/ML IV BOLUS
0.5000 mg/kg | Freq: Once | INTRAVENOUS | Status: AC
  Administered 2019-03-10: 40 mg via INTRAVENOUS
  Filled 2019-03-10: qty 20

## 2019-03-10 MED ORDER — HYDROCODONE-ACETAMINOPHEN 5-325 MG PO TABS: 1.0000 | ORAL_TABLET | Freq: Four times a day (QID) | ORAL | 0 refills | Status: DC | PRN

## 2019-03-10 MED ORDER — PROPOFOL 10 MG/ML IV BOLUS
INTRAVENOUS | Status: AC | PRN
  Administered 2019-03-10: 40 mg via INTRAVENOUS

## 2019-03-10 MED ORDER — HYDROMORPHONE HCL 1 MG/ML IJ SOLN
1.0000 mg | Freq: Once | INTRAMUSCULAR | Status: AC
  Administered 2019-03-10: 14:00:00 1 mg via INTRAVENOUS

## 2019-03-10 MED ORDER — ONDANSETRON HCL 4 MG/2ML IJ SOLN
4.0000 mg | Freq: Once | INTRAMUSCULAR | Status: AC
  Administered 2019-03-10: 4 mg via INTRAVENOUS
  Filled 2019-03-10: qty 2

## 2019-03-10 NOTE — Discharge Instructions (Signed)
Wear arm sling is applied for the next several days, then gradually reintroduce normal activity.  Ice for 20 minutes every 2 hours while awake for the next 2 days.  Hydrocodone as prescribed as needed for pain.  Return to the emergency department if you experience any new and/or concerning symptoms.

## 2019-03-10 NOTE — ED Provider Notes (Signed)
[facility] EMERGENCY DEPARTMENT Provider Note   CSN: [id number] Arrival date & time: 03/10/19  1038     History   Chief Complaint Chief Complaint  Patient presents with  . Shoulder Injury    right    HPI Peter Oconnor is a 58 y.o. male.     Patient is a 58 year old male with past medical history of coronary artery disease with stent, CHF, hyperlipidemia, and sleep apnea.  He presents today for evaluation of a right shoulder injury.  Patient was in the freezer at work when he slipped and fell, injuring his right shoulder.  He denies any numbness or tingling.  He has been unable to lift or move his shoulder since.  The history is provided by the patient.  Shoulder Injury This is a new problem. The current episode started less than 1 hour ago. The problem occurs constantly. The problem has not changed since onset.Exacerbated by: Movement and palpation. Nothing relieves the symptoms. He has tried nothing for the symptoms.    Past Medical History:  Diagnosis Date  . Allergy   . Arthritis   . 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    Marland Kitchen CHF (congestive heart failure) ([place])   . Chicken pox   . Hyperlipidemia with target LDL less than 70 10/19/2015  . MI, old   . Obesity (BMI 30-39.9) 10/19/2015  . OSA (obstructive sleep apnea) 10/19/2015  . Sleep apnea     Patient Active Problem List   Diagnosis Date Noted  . Encounter for removal of sutures 01/22/2018  . Light headed 01/22/2018  . Left knee pain 09/28/2017  . Effusion of knee joint, left 09/28/2017  . Primary osteoarthritis of both knees 09/28/2017  . Hyperlipidemia with target LDL less than 70 10/19/2015  . Benign essential HTN 10/19/2015  . OSA (obstructive sleep apnea) 10/19/2015  . Obesity (BMI 30-39.9) 10/19/2015  . CAD (coronary artery  disease), native coronary artery 01/25/2014    Past Surgical History:  Procedure Laterality Date  . CARDIAC CATHETERIZATION    . CORONARY ANGIOPLASTY  01/25/2014   PCI of LCx and LAD  . KNEE SURGERY    . NASAL SINUS SURGERY          Home Medications    Prior to Admission medications   Medication Sig Start Date End Date Taking? Authorizing Provider  aspirin 81 MG tablet Take 81 mg by mouth daily.    [provider]  Coenzyme Q10 (CO Q-10) 200 MG CAPS Take 1 capsule by mouth daily.    [provider]  lisinopril (ZESTRIL) 5 MG tablet Take 1 tablet (5 mg total) by mouth daily. 01/19/19 04/19/19  Sueanne Margarita, MD  metoprolol tartrate (LOPRESSOR) 25 MG tablet Take 1 tablet (25 mg total) by mouth 2 (two) times daily. 01/19/19 04/19/19  Sueanne Margarita, MD  rosuvastatin (CRESTOR) 40 MG tablet Take 1 tablet (40 mg total) by mouth daily. 01/19/19 04/19/19  Sueanne Margarita, MD  sildenafil (REVATIO) 20 MG tablet Take 1-3 pills 45 minutes prior to intercourse as needed daily. Must not use with nitrates. 11/16/17   Libby Maw, MD  ticagrelor (BRILINTA) 60 MG TABS tablet Take 1 tablet (60 mg total) by mouth 2 (two) times daily. 01/19/19 04/19/19  Sueanne Margarita, MD  Family History Family History  Problem Relation Age of Onset  . Hyperlipidemia Mother   . Leukemia Father   . Arthritis Father   . Heart attack Maternal Grandfather   . Heart disease Maternal Grandfather   . Hyperlipidemia Maternal Grandfather   . Stroke Maternal Grandfather   . Hyperlipidemia Maternal Grandmother   . Colon cancer Neg Hx   . Liver cancer Neg Hx   . Stomach cancer Neg Hx   . Rectal cancer Neg Hx   . Esophageal cancer Neg Hx     Social History Social History   Tobacco Use  . Smoking status: Former Smoker    Quit date: 1990    Years since quitting: 30.6  . Smokeless tobacco: Never Used  Substance Use Topics  . Alcohol use: Yes    Comment: occasionally  . Drug use: No      Allergies   Penicillins   Review of Systems Review of Systems  All other systems reviewed and are negative.    Physical Exam Updated Vital Signs BP (!) 141/84 (BP Location: Right Arm)   Pulse 67   Temp 98.5 F (36.9 C) (Oral)   Resp 18   Ht 5\' 11"  (1.803 m)   Wt 122.5 kg   SpO2 98%   BMI 37.66 kg/m   Physical Exam Vitals signs and nursing note reviewed.  Constitutional:      General: He is not in acute distress.    Appearance: He is well-developed. He is not diaphoretic.  HENT:     Head: Normocephalic and atraumatic.  Neck:     Musculoskeletal: Normal range of motion and neck supple.  Cardiovascular:     Rate and Rhythm: Normal rate and regular rhythm.     Heart sounds: No murmur. No friction rub.  Pulmonary:     Effort: Pulmonary effort is normal. No respiratory distress.     Breath sounds: Normal breath sounds. No wheezing or rales.  Abdominal:     General: Bowel sounds are normal. There is no distension.     Palpations: Abdomen is soft.     Tenderness: There is no abdominal tenderness.  Musculoskeletal: Normal range of motion.     Comments: The right shoulder appears to have a glenohumeral dislocation.  Ulnar and radial pulses are easily palpable and he is able to flex, extend, and oppose all fingers.  Sensation is intact throughout the entire hand.  Skin:    General: Skin is warm and dry.  Neurological:     Mental Status: He is alert and oriented to person, place, and time.     Coordination: Coordination normal.      ED Treatments / Results  Labs (all labs ordered are listed, but only abnormal results are displayed) Labs Reviewed - No data to display  EKG None  Radiology No results found.  Procedures .Sedation  Date/Time: 03/10/2019 1:27 PM Performed by: Veryl Speak, MD Authorized by: Veryl Speak, MD   Consent:    Consent obtained:  Verbal   Consent given by:  Patient   Risks discussed:  Allergic reaction, dysrhythmia, inadequate  sedation, nausea, prolonged hypoxia resulting in organ damage, prolonged sedation necessitating reversal, respiratory compromise necessitating ventilatory assistance and intubation and vomiting   Alternatives discussed:  Analgesia without sedation, anxiolysis and regional anesthesia Universal protocol:    Procedure explained and questions answered to patient or proxy's satisfaction: yes     Relevant documents present and verified: yes     Test results available and  properly labeled: yes     Imaging studies available: yes     Required blood products, implants, devices, and special equipment available: yes     Site/side marked: yes     Immediately prior to procedure a time out was called: yes     Patient identity confirmation method:  Verbally with patient Indications:    Procedure necessitating sedation performed by:  Physician performing sedation Pre-sedation assessment:    Time since last food or drink:  4 hours   ASA classification: class 1 - normal, healthy patient     Neck mobility: normal     Mouth opening:  3 or more finger widths   Thyromental distance:  4 finger widths   Mallampati score:  I - soft palate, uvula, fauces, pillars visible   Pre-sedation assessments completed and reviewed: airway patency, cardiovascular function, hydration status, mental status, nausea/vomiting, pain level, respiratory function and temperature   Immediate pre-procedure details:    Reassessment: Patient reassessed immediately prior to procedure     Reviewed: vital signs, relevant labs/tests and NPO status     Verified: bag valve mask available, emergency equipment available, intubation equipment available, IV patency confirmed, oxygen available and suction available   Procedure details (see MAR for exact dosages):    Preoxygenation:  Nasal cannula   Sedation:  Propofol   Intra-procedure monitoring:  Blood pressure monitoring, cardiac monitor, continuous pulse oximetry, frequent LOC assessments,  frequent vital sign checks and continuous capnometry   Intra-procedure events: none     Total Provider sedation time (minutes):  10 Post-procedure details:    Attendance: Constant attendance by certified staff until patient recovered     Recovery: Patient returned to pre-procedure baseline     Post-sedation assessments completed and reviewed: airway patency, cardiovascular function, hydration status, mental status, nausea/vomiting, pain level, respiratory function and temperature     Patient is stable for discharge or admission: yes     Patient tolerance:  Tolerated well, no immediate complications Comments:     Shoulder easily reduced.  Patient tolerated the procedure well.  Reduction of dislocation  Date/Time: 03/10/2019 1:31 PM Performed by: Veryl Speak, MD Authorized by: Veryl Speak, MD  Consent: Verbal consent obtained. Written consent obtained. Risks and benefits: risks, benefits and alternatives were discussed Patient understanding: patient states understanding of the procedure being performed Patient consent: the patient's understanding of the procedure matches consent given Procedure consent: procedure consent matches procedure scheduled Relevant documents: relevant documents present and verified Test results: test results available and properly labeled Site marked: the operative site was marked Imaging studies: imaging studies available Patient identity confirmed: verbally with patient and arm band Time out: Immediately prior to procedure a "time out" was called to verify the correct patient, procedure, equipment, support staff and site/side marked as required. Local anesthesia used: no  Anesthesia: Local anesthesia used: no  Sedation: Patient sedated: no  Patient tolerance: patient tolerated the procedure well with no immediate complications Comments: Shoulder was easily reduced using traction countertraction technique.  Successful reduction confirmed with  postreduction x-rays.    (including critical care time)  Medications Ordered in ED Medications  ondansetron (ZOFRAN) injection 4 mg (has no administration in time range)  morphine 4 MG/ML injection 4 mg (has no administration in time range)     Initial Impression / Assessment and Plan / ED Course  I have reviewed the triage vital signs and the nursing notes.  Pertinent labs & imaging results that were available during my care of the  patient were reviewed by me and considered in my medical decision making (see chart for details).  Patient presenting here with complaints of a right shoulder injury.  His x-rays reveal a glenohumeral dislocation.  This was reduced easily with conscious sedation using propofol.  Patient's right arm neurovascularly intact pre-and post reduction.  He will be discharged in an arm sling, advised to rest, prescribed medication for his pain, and is to follow-up as needed for any problems.  Final Clinical Impressions(s) / ED Diagnoses   Final diagnoses:  None    ED Discharge Orders    None       Veryl Speak, MD 03/10/19 1332

## 2019-03-10 NOTE — ED Notes (Signed)
ED Provider at bedside. 

## 2019-03-10 NOTE — ED Triage Notes (Signed)
Pt reports fall 1 hour ago, left shoulder injury, limited ROM, landed forward, right side. Appears in distress.

## 2019-03-14 ENCOUNTER — Encounter: Payer: Self-pay | Admitting: Family Medicine

## 2019-03-14 ENCOUNTER — Ambulatory Visit: Payer: BC Managed Care – PPO | Admitting: Family Medicine

## 2019-03-14 ENCOUNTER — Other Ambulatory Visit: Payer: Self-pay

## 2019-03-14 ENCOUNTER — Telehealth: Payer: Self-pay | Admitting: Family Medicine

## 2019-03-14 ENCOUNTER — Ambulatory Visit (HOSPITAL_BASED_OUTPATIENT_CLINIC_OR_DEPARTMENT_OTHER)
Admission: RE | Admit: 2019-03-14 | Discharge: 2019-03-14 | Disposition: A | Discharge status: Home / Self Care | Payer: BC Managed Care – PPO | Source: Ambulatory Visit | Attending: Family Medicine | Admitting: Family Medicine

## 2019-03-14 VITALS — BP 140/80 | Ht 71.0 in | Wt 270.0 lb

## 2019-03-14 DIAGNOSIS — S43004A Unspecified dislocation of right shoulder joint, initial encounter: Secondary | ICD-10-CM

## 2019-03-14 MED ORDER — CYCLOBENZAPRINE HCL 10 MG PO TABS: 10.0000 mg | ORAL_TABLET | Freq: Three times a day (TID) | ORAL | 1 refills | Status: DC | PRN

## 2019-03-14 MED ORDER — IBUPROFEN-FAMOTIDINE 800-26.6 MG PO TABS: 1.0000 | ORAL_TABLET | Freq: Three times a day (TID) | ORAL | 3 refills | Status: DC

## 2019-03-14 NOTE — Patient Instructions (Signed)
Good to see you Please try ice  Please try the muscle relaxer at night  Please take the ibuprofen as needed  Please use the sling  I will call you with the results from today. Please send me a message in MyChart with any questions or updates.  Please see me back in 3 weeks.   --Dr. Raeford Razor

## 2019-03-14 NOTE — Telephone Encounter (Signed)
Informed of xray results.   Rosemarie Ax, MD [facility] 03/14/2019, 5:14 PM

## 2019-03-14 NOTE — Progress Notes (Signed)
Medication Samples have been provided to the patient.  Drug name: Duexis      Strength: 800mg /26.6mg         Qty: 1 Box  LOTMD:[id number]  Exp.Date: 05/2020  Dosing instructions: take 1 tablet by mouth three (3) times a day.  The patient has been instructed regarding the correct time, dose, and frequency of taking this medication, including desired effects and most common side effects.   Sherrie George, [place] 3:47 PM 03/14/2019

## 2019-03-14 NOTE — Assessment & Plan Note (Signed)
First-time dislocation.  There is concerns for Bankart lesion on x-ray but limited view. -Flexeril. -Provided sample Duexis and Duexis sent in. -X-ray. -Counseled on home exercise therapy and supportive care.

## 2019-03-14 NOTE — Progress Notes (Signed)
Peter Oconnor - 58 y.o. male MRN [id number]  Date of birth: [birth year]  SUBJECTIVE:  Including CC & ROS.  Chief Complaint  Patient presents with  . Shoulder Injury    right shoulder x 03/10/2019    Peter Oconnor is a 58 y.o. male that is presenting with acute right shoulder pain.  He had a fall over the weekend and had dislocation of his right shoulder.  This is a first-time dislocation.  They are able to set the shoulder back into the joint.  He has been using the sling but the pain is still significant.  He is having trouble with abduction and certain other movements.  The pain is severe and more constant.  He has taken pain medication with limited improvement.  Pain is localized to the shoulder.  Is worse with any movements.  Denies any numbness or tingling..  Independent review of the right shoulder x-ray from 8/21 shows suspected reduction of the right shoulder.   Review of Systems  Constitutional: Negative for fever.  HENT: Negative for congestion.   Respiratory: Negative for cough.   Cardiovascular: Negative for chest pain.  Gastrointestinal: Negative for abdominal pain.  Musculoskeletal: Positive for joint swelling.  Skin: Negative for color change.  Neurological: Negative for weakness.  Hematological: Negative for adenopathy.    HISTORY: Past Medical, Surgical, Social, and Family History Reviewed & Updated per EMR.   Pertinent Historical Findings include:  Past Medical History:  Diagnosis Date  . Allergy   . Arthritis   . 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    Marland Kitchen CHF (congestive heart failure) ([place])   . Chicken pox   . Hyperlipidemia with target LDL less than 70 10/19/2015  . MI, old   . Obesity (BMI 30-39.9) 10/19/2015  . OSA (obstructive sleep apnea) 10/19/2015  . Sleep apnea     Past Surgical  History:  Procedure Laterality Date  . CARDIAC CATHETERIZATION    . CORONARY ANGIOPLASTY  01/25/2014   PCI of LCx and LAD  . KNEE SURGERY    . NASAL SINUS SURGERY      Allergies  Allergen Reactions  . Penicillins Rash    Also itching    Family History  Problem Relation Age of Onset  . Hyperlipidemia Mother   . Leukemia Father   . Arthritis Father   . Heart attack Maternal Grandfather   . Heart disease Maternal Grandfather   . Hyperlipidemia Maternal Grandfather   . Stroke Maternal Grandfather   . Hyperlipidemia Maternal Grandmother   . Colon cancer Neg Hx   . Liver cancer Neg Hx   . Stomach cancer Neg Hx   . Rectal cancer Neg Hx   . Esophageal cancer Neg Hx      Social History   Socioeconomic History  . Marital status: Married    Spouse name: Not on file  . Number of children: 3  . Years of education: Not on file  . Highest education level: Not on file  Occupational History  . Occupation: self employed  Social Needs  . Financial resource strain: Not on file  . Food insecurity    Worry: Not on file    Inability: Not on file  . Transportation needs    Medical: Not on file    Non-medical: Not on file  Tobacco Use  . Smoking status: Former Smoker    Quit date: 1990    Years since quitting: 30.6  . Smokeless tobacco: Never Used  Substance and Sexual Activity  . Alcohol use: Yes    Comment: occasionally  . Drug use: No  . Sexual activity: Not on file  Lifestyle  . Physical activity    Days per week: Not on file    Minutes per session: Not on file  . Stress: Not on file  Relationships  . Social Herbalist on phone: Not on file    Gets together: Not on file    Attends religious service: Not on file    Active member of club or organization: Not on file    Attends meetings of clubs or organizations: Not on file    Relationship status: Not on file  . Intimate partner violence    Fear of current or ex partner: Not on file    Emotionally abused:  Not on file    Physically abused: Not on file    Forced sexual activity: Not on file  Other Topics Concern  . Not on file  Social History Narrative  . Not on file     PHYSICAL EXAM:  VS: BP 140/80   Ht 5\' 11"  (1.803 m)   Wt 270 lb (122.5 kg)   BMI 37.66 kg/m  Physical Exam Gen: NAD, alert, cooperative with exam, well-appearing ENT: normal lips, normal nasal mucosa,  Eye: normal EOM, normal conjunctiva and lids CV:  no edema, +2 pedal pulses   Resp: no accessory muscle use, non-labored,   Skin: no rashes, no areas of induration  Neuro: normal tone, normal sensation to touch Psych:  normal insight, alert and oriented MSK:  Right shoulder: Obvious effusion. Limited passive external and internal rotation. Limited passive abduction. Neurovascularly intact     ASSESSMENT & PLAN:   Dislocation of right shoulder joint First-time dislocation.  There is concerns for Bankart lesion on x-ray but limited view. -Flexeril. -Provided sample Duexis and Duexis sent in. -X-ray. -Counseled on home exercise therapy and supportive care.

## 2019-04-04 ENCOUNTER — Ambulatory Visit: Payer: BC Managed Care – PPO | Admitting: Family Medicine

## 2019-04-04 ENCOUNTER — Other Ambulatory Visit: Payer: Self-pay

## 2019-04-04 ENCOUNTER — Encounter: Payer: Self-pay | Admitting: Family Medicine

## 2019-04-04 DIAGNOSIS — S43004D Unspecified dislocation of right shoulder joint, subsequent encounter: Secondary | ICD-10-CM

## 2019-04-04 NOTE — Assessment & Plan Note (Signed)
Initial injury on 8/27.  Having improvement since his initial injury.  Range of motion is coming back slowly.  No significant instability today. -Counseled on home exercise therapy and supportive care. -May need to consider ultrasound to evaluate rotator cuff if not making progress. -Consider formal physical therapy. -May to consider MRI if any instability or lack of progression of improvement.

## 2019-04-04 NOTE — Progress Notes (Signed)
[name] - 58 y.o. male MRN [id number]  Date of birth: 05-30-1961  SUBJECTIVE:  Including CC & ROS.  Chief Complaint  Patient presents with  . Follow-up    follow up for right shoulder    Peter Oconnor is a 58 y.o. male that is following up for a right shoulder dislocation.  He is been using the sling intermittently.  He has been getting back to his normal activities and notices an improvement in his range of motion.  Still has lack of abduction and flexion.  Pain is intermittent and mild.  Is not currently taking any of the anti-inflammatories or muscle relaxer.  Denies any numbness or tingling.   Review of Systems  Constitutional: Negative for fever.  HENT: Negative for congestion.   Respiratory: Negative for cough.   Cardiovascular: Negative for chest pain.  Gastrointestinal: Negative for abdominal pain.  Musculoskeletal: Positive for joint swelling.  Skin: Negative for color change.  Neurological: Negative for syncope.  Hematological: Negative for adenopathy.    HISTORY: Past Medical, Surgical, Social, and Family History Reviewed & Updated per EMR.   Pertinent Historical Findings include:  Past Medical History:  Diagnosis Date  . Allergy   . Arthritis   . 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    Marland Kitchen CHF (congestive heart failure) ([place])   . Chicken pox   . Hyperlipidemia with target LDL less than 70 10/19/2015  . MI, old   . Obesity (BMI 30-39.9) 10/19/2015  . OSA (obstructive sleep apnea) 10/19/2015  . Sleep apnea     Past Surgical History:  Procedure Laterality Date  . CARDIAC CATHETERIZATION    . CORONARY ANGIOPLASTY  01/25/2014   PCI of LCx and LAD  . KNEE SURGERY    . NASAL SINUS SURGERY      Allergies  Allergen Reactions  . Penicillins Rash    Also itching    Family History  Problem  Relation Age of Onset  . Hyperlipidemia Mother   . Leukemia Father   . Arthritis Father   . Heart attack Maternal Grandfather   . Heart disease Maternal Grandfather   . Hyperlipidemia Maternal Grandfather   . Stroke Maternal Grandfather   . Hyperlipidemia Maternal Grandmother   . Colon cancer Neg Hx   . Liver cancer Neg Hx   . Stomach cancer Neg Hx   . Rectal cancer Neg Hx   . Esophageal cancer Neg Hx      Social History   Socioeconomic History  . Marital status: Married    Spouse name: Not on file  . Number of children: 3  . Years of education: Not on file  . Highest education level: Not on file  Occupational History  . Occupation: self employed  Social Needs  . Financial resource strain: Not on file  . Food insecurity    Worry: Not on file    Inability: Not on file  . Transportation needs    Medical: Not on file    Non-medical: Not on file  Tobacco Use  . Smoking status: Former Smoker    Quit date: 1990    Years since quitting: 30.7  . Smokeless tobacco: Never Used  Substance and Sexual Activity  . Alcohol use: Yes    Comment: occasionally  . Drug use: No  . Sexual  activity: Not on file  Lifestyle  . Physical activity    Days per week: Not on file    Minutes per session: Not on file  . Stress: Not on file  Relationships  . Social Herbalist on phone: Not on file    Gets together: Not on file    Attends religious service: Not on file    Active member of club or organization: Not on file    Attends meetings of clubs or organizations: Not on file    Relationship status: Not on file  . Intimate partner violence    Fear of current or ex partner: Not on file    Emotionally abused: Not on file    Physically abused: Not on file    Forced sexual activity: Not on file  Other Topics Concern  . Not on file  Social History Narrative  . Not on file     PHYSICAL EXAM:  VS: BP 134/84   Ht 5\' 11"  (1.803 m)   Wt 270 lb (122.5 kg)   BMI 37.66 kg/m   Physical Exam Gen: NAD, alert, cooperative with exam, well-appearing ENT: normal lips, normal nasal mucosa,  Eye: normal EOM, normal conjunctiva and lids CV:  no edema, +2 pedal pulses   Resp: no accessory muscle use, non-labored,  Skin: no rashes, no areas of induration  Neuro: normal tone, normal sensation to touch Psych:  normal insight, alert and oriented MSK:  Right shoulder: Normal internal rotation. Some limitations with external rotation. Limited adduction flexion around 90 degrees. Some weakness with empty can testing. Neurovascular intact     ASSESSMENT & PLAN:   Dislocation of right shoulder joint Initial injury on 8/27.  Having improvement since his initial injury.  Range of motion is coming back slowly.  No significant instability today. -Counseled on home exercise therapy and supportive care. -May need to consider ultrasound to evaluate rotator cuff if not making progress. -Consider formal physical therapy. -May to consider MRI if any instability or lack of progression of improvement.

## 2019-04-04 NOTE — Patient Instructions (Signed)
Good to see you Please start with range of motion exercises and then progress to the strengthening  Please use ice afterwards   Please send me a message in MyChart with any questions or updates.  Please see me back in 4 weeks.   --Dr. Raeford Razor

## 2019-04-08 ENCOUNTER — Telehealth: Payer: Self-pay | Admitting: Cardiology

## 2019-04-08 MED ORDER — ROSUVASTATIN CALCIUM 40 MG PO TABS: 40.0000 mg | ORAL_TABLET | Freq: Every day | ORAL | 0 refills | Status: DC

## 2019-04-08 MED ORDER — TICAGRELOR 60 MG PO TABS: 60.0000 mg | ORAL_TABLET | Freq: Two times a day (BID) | ORAL | 0 refills | Status: DC

## 2019-04-08 MED ORDER — METOPROLOL TARTRATE 25 MG PO TABS: 25.0000 mg | ORAL_TABLET | Freq: Two times a day (BID) | ORAL | 0 refills | Status: DC

## 2019-04-08 MED ORDER — LISINOPRIL 5 MG PO TABS: 5.0000 mg | ORAL_TABLET | Freq: Every day | ORAL | 0 refills | Status: DC

## 2019-04-08 NOTE — Telephone Encounter (Signed)
New Message   *STAT* If patient is at the pharmacy, call can be transferred to refill team.   1. Which medications need to be refilled? (please list name of each medication and dose if known) rosuvastatin (CRESTOR) 40 MG tablet   metoprolol tartrate (LOPRESSOR) 25 MG tablet      lisinopril (ZESTRIL) 5 MG tablet       ticagrelor (BRILINTA) 60 MG TABS tablet     2. Which pharmacy/location (including street and city if local pharmacy) is medication to be sent to?[facility] [place], [place]  3. Do they need a 30 day or 90 day supply? 90 day

## 2019-04-08 NOTE — Telephone Encounter (Signed)
Pt's medications were sent to pt's pharmacy as requested. Confirmation received.  

## 2019-04-15 ENCOUNTER — Ambulatory Visit: Payer: BLUE CROSS/BLUE SHIELD | Admitting: Cardiology

## 2019-05-02 ENCOUNTER — Ambulatory Visit: Payer: BC Managed Care – PPO | Admitting: Family Medicine

## 2019-05-23 ENCOUNTER — Ambulatory Visit: Payer: BC Managed Care – PPO | Admitting: Cardiology

## 2019-05-25 ENCOUNTER — Other Ambulatory Visit: Payer: Self-pay | Admitting: Cardiology

## 2019-05-25 MED ORDER — LISINOPRIL 5 MG PO TABS: 5.0000 mg | ORAL_TABLET | Freq: Every day | ORAL | 0 refills | Status: DC

## 2019-05-25 MED ORDER — TICAGRELOR 60 MG PO TABS: 60.0000 mg | ORAL_TABLET | Freq: Two times a day (BID) | ORAL | 0 refills | Status: DC

## 2019-05-25 MED ORDER — METOPROLOL TARTRATE 25 MG PO TABS: 25.0000 mg | ORAL_TABLET | Freq: Two times a day (BID) | ORAL | 0 refills | Status: DC

## 2019-05-25 MED ORDER — ROSUVASTATIN CALCIUM 40 MG PO TABS: 40.0000 mg | ORAL_TABLET | Freq: Every day | ORAL | 0 refills | Status: DC

## 2019-06-24 ENCOUNTER — Telehealth: Payer: Self-pay

## 2019-06-24 ENCOUNTER — Other Ambulatory Visit: Payer: Self-pay

## 2019-06-24 MED ORDER — TICAGRELOR 60 MG PO TABS: 60.0000 mg | ORAL_TABLET | Freq: Two times a day (BID) | ORAL | 1 refills | Status: AC

## 2019-06-24 MED ORDER — LISINOPRIL 5 MG PO TABS: 5.0000 mg | ORAL_TABLET | Freq: Every day | ORAL | 1 refills | Status: DC

## 2019-06-24 MED ORDER — ROSUVASTATIN CALCIUM 40 MG PO TABS: 40.0000 mg | ORAL_TABLET | Freq: Every day | ORAL | 1 refills | Status: DC

## 2019-06-24 MED ORDER — METOPROLOL TARTRATE 25 MG PO TABS: 25.0000 mg | ORAL_TABLET | Freq: Two times a day (BID) | ORAL | 1 refills | Status: DC

## 2019-06-24 NOTE — Telephone Encounter (Signed)
Refills for lisinopril, metoprolol, Brilinta, and rosuvastatin all sent into the pts confirmed pharmacy of choice, for up to 6 months, as advised by Dr. Radford Pax.  Pt notified of this via his active mychart account.

## 2019-06-24 NOTE — Telephone Encounter (Signed)
Ok to refill meds for 6 months

## 2019-06-24 NOTE — Telephone Encounter (Signed)
We accept [employer] so shouldn't need a new Cardiologist.  He can have refills for a month but needs an OV as he almost 2 years from last OV.  It can be a virtual visit

## 2019-06-24 NOTE — Telephone Encounter (Signed)
Pt has called in stating that he would like the four medications that we prescribe to be refilled during his transition to a different cardiologist. Pt is switching to a lower cost insurance at the beginning of the year and is having to switch providers because of this. Pt is requesting enough medications to last him during this transition. Pt would like a call back at [phone].

## 2019-12-20 ENCOUNTER — Other Ambulatory Visit: Payer: Self-pay | Admitting: Cardiology

## 2019-12-20 MED ORDER — METOPROLOL TARTRATE 25 MG PO TABS: 25.0000 mg | ORAL_TABLET | Freq: Two times a day (BID) | ORAL | 0 refills | Status: DC

## 2019-12-20 MED ORDER — TICAGRELOR 60 MG PO TABS: 60.0000 mg | ORAL_TABLET | Freq: Two times a day (BID) | ORAL | 0 refills | Status: DC

## 2019-12-20 MED ORDER — ROSUVASTATIN CALCIUM 40 MG PO TABS: 40.0000 mg | ORAL_TABLET | Freq: Every day | ORAL | 0 refills | Status: DC

## 2019-12-20 MED ORDER — LISINOPRIL 5 MG PO TABS: 5.0000 mg | ORAL_TABLET | Freq: Every day | ORAL | 0 refills | Status: DC

## 2019-12-20 NOTE — Telephone Encounter (Signed)
Pt's medications were sent to pt's pharmacy as requested. Pt also requested Brilinta. This medication is no longer on pt's medication list. Would Dr. Radford Pax like to refill this medication?: please address.

## 2019-12-20 NOTE — Telephone Encounter (Signed)
Pt's medication Brilinta 60 mg tablets were sent to pt's pharmacy, per Dr. Radford Pax as requested. Confirmation received.

## 2019-12-20 NOTE — Telephone Encounter (Signed)
He has cancelled multiple appt with me and I have not seen him in over 2 years.  OK to give Rx for 1 month but no further refills unless he comes in to see me or extender.  Also, is he stlil taking Brilinta

## 2019-12-20 NOTE — Telephone Encounter (Signed)
*  STAT* If patient is at the pharmacy, call can be transferred to refill team.   1. Which medications need to be refilled? (please list name of each medication and dose if known)  BRILINTA 60 MG TABS tablet  metoprolol tartrate (LOPRESSOR) 25 MG tablet(Expired) rosuvastatin (CRESTOR) 40 MG tablet(Expired) lisinopril (ZESTRIL) 5 MG tablet(Expired)  2. Which pharmacy/location (including street and city if local pharmacy) is medication to be sent to? [facility] at [address] [place], [place]  3. Do they need a 30 day or 90 day supply? 90 day supply

## 2021-02-18 IMAGING — DX DG SHOULDER 2+V*R*
3 series · 3 of 3 positions shown · non-contrast
Comparison: 03/10/2019

CLINICAL DATA: Right shoulder dislocation

EXAM:
RIGHT SHOULDER - 2+ VIEW

[shoulder grashey]
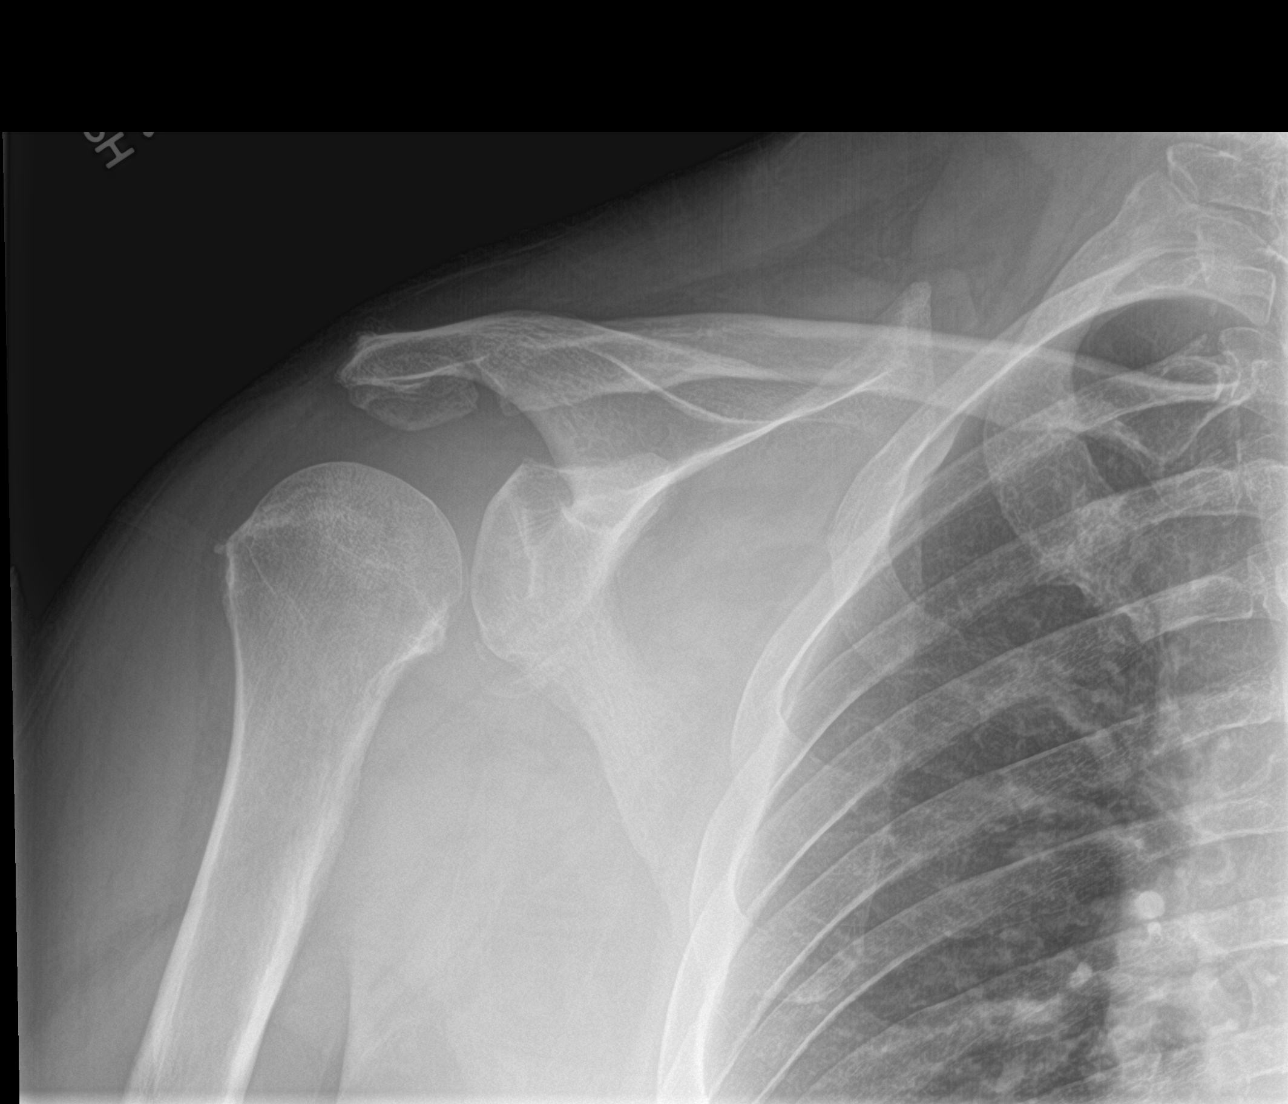

[shoulder y view]
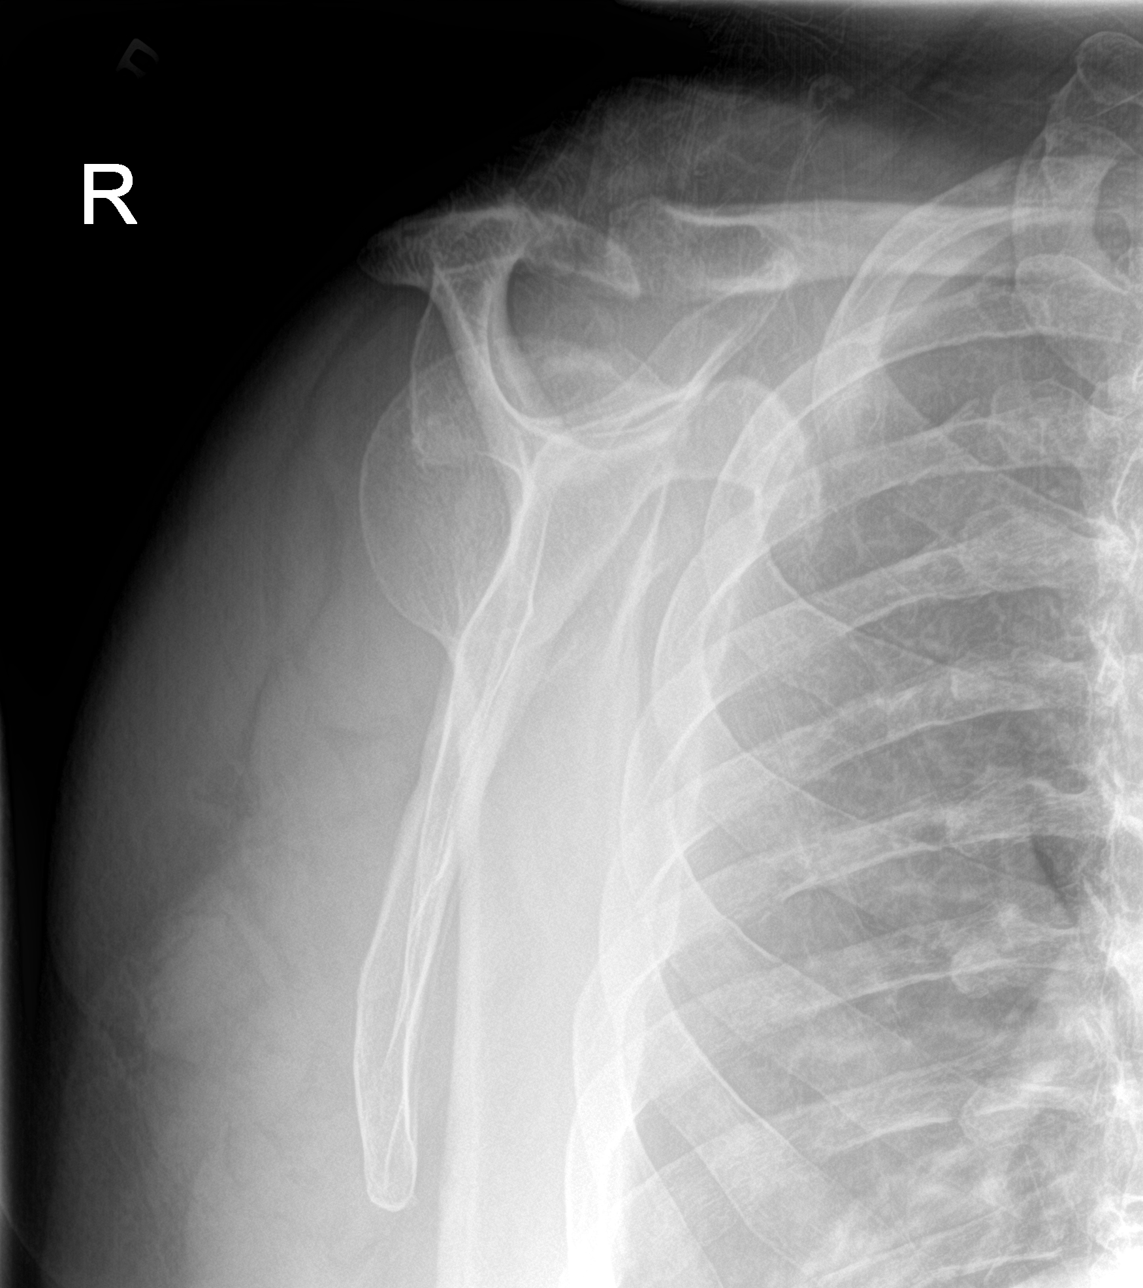

[shoulder axillary]
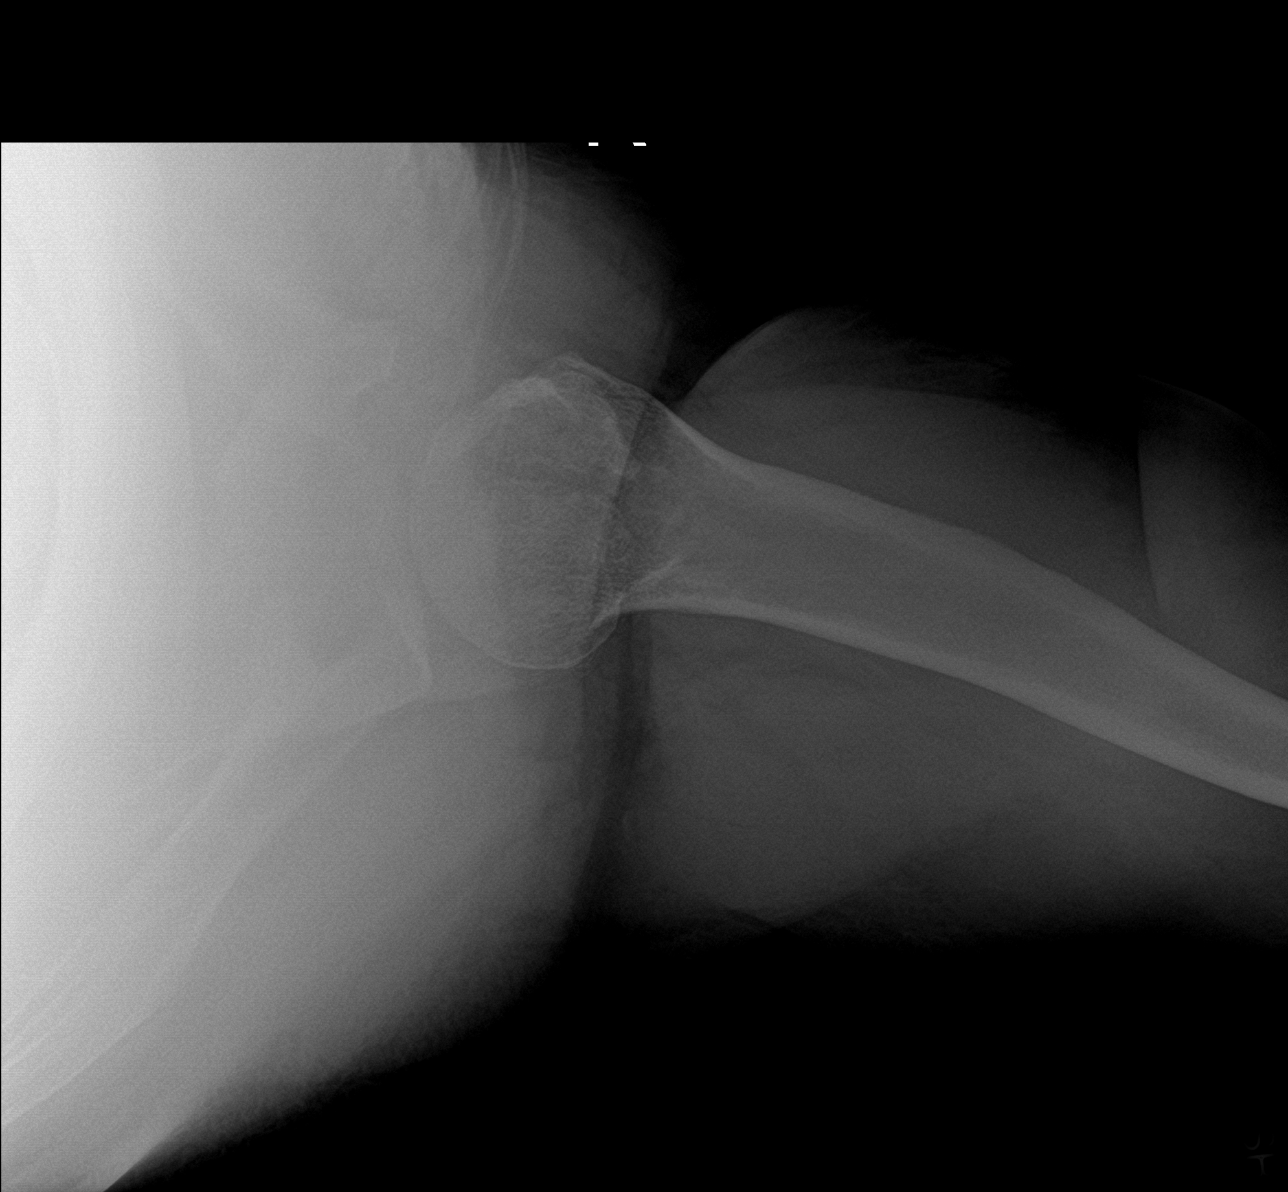

[3 of 3 positions shown; findings below may reference images not displayed]

FINDINGS: The right glenohumeral joint is in anatomic apposition, with
widening of the joint space, likely reflecting a large glenohumeral
joint effusion. There are bony Bankart fracture fragments of the
anterior inferior glenoid, of generally uncertain acuity although
seen on prior radiographs dated 03/10/2019. Small Hill-Sachs
deformity of the right humeral head. The right acromioclavicular
joint is intact. The partially imaged right chest is unremarkable.
IMPRESSION: The right glenohumeral joint is in anatomic apposition, with
widening of the joint space, likely reflecting a large glenohumeral
joint effusion. There are bony Bankart fracture fragments of the
anterior inferior glenoid, of generally uncertain acuity although
seen on prior radiographs dated 03/10/2019. Small Hill-Sachs
deformity of the right humeral head.

## 2021-09-12 ENCOUNTER — Telehealth: Payer: Self-pay | Admitting: *Deleted

## 2021-09-12 DIAGNOSIS — S8261XA Displaced fracture of lateral malleolus of right fibula, initial encounter for closed fracture: Secondary | ICD-10-CM | POA: Diagnosis not present

## 2021-09-12 NOTE — Telephone Encounter (Signed)
Per surgery scheduler Nickola Major, pt states he is no longer taking Brilinta.  ?

## 2021-09-12 NOTE — Telephone Encounter (Signed)
SURGEON'S OFFICE FAXED OVER WHAT SEEMS TO BE MORE OF A REVIEW OF SYSTEMS, [place] IT IS FOR SURGERY CLEARANCE. I HOWEVER AM ABLE TO FIND THE SURGERY INFORMATION IN THE CHART. I LEFT SURGERY SCHEDULER KELLY HIGH A VERY DETAILED MESSAGE THAT I CAN SEE THE SURGERY INFORMATION IN THE CHART, I READ OFF WHAT I SEE FOR THE SURGERY AND ANESTHESIA AND SURGEON. I LEFT MESSAGE FOR KELLY IF ANYTHING IS DIFFERENT FROM WHAT I LEFT ON MY VM SHE CAN CALL BACK AT [phone].  ? ? ? ?Pre-operative Risk Assessment  ?  ?Patient Name: Peter Oconnor  ?DOB: Jan 18, 1961 ?MRN: [id number]  ? ?  ?PT HA A NEW PT APPT WITH DR. Angelena Form 09/13/21.  ?Request for Surgical Clearance   ? ?Procedure:   STAT ORIF RIGHT ANKLE ? ?Date of Surgery:  Clearance TBD                              ?   ?Surgeon:  DR. DANIEL CAFFREY ?Surgeon's Group or Practice Name:  [facility] ?Phone number:  [phone] ?Fax number:  [phone] ATTN: KELLY HIGH ?  ?Type of Clearance Requested:   ?- Medical  ?- Pharmacy:  Hold Aspirin and Ticagrelor (Brilinta)   ?  ?Type of Anesthesia:   CHOICE ?  ?Additional requests/questions:   ? ?Signed, ?Julaine Hua   ?09/12/2021, 1:57 PM  ? ?

## 2021-09-12 NOTE — Progress Notes (Signed)
? ?Chief Complaint  ?Patient presents with  ? New Patient (Initial Visit)  ?  CAD, Pre-operative cardiovascular risk assessment  ? ?History of Present Illness: 61 yo male with history of arthritis, tobacco abuse, HTN, CAD, hyperlipidemia and sleep apnea here today for cardiac follow up. He has been followed in our office by Dr. Radford Pax. He was last seen by Dr. Radford Pax in April 2019. He has an anterior MI in July 201 in [place]. Cardiac cath 2015 with mild RCA stenosis, severe LAD stenosis and occluded Circumflex. He had stenting of his LAD and Circumflex at that time. Echo in 2015 with LVEF=60-65%. He was seen in cardiology at [facility] in July 2021. Beta blocker stopped due to erectile dysfunction.  ? ?He is here today to re-establish care in our office. He has an upcoming ankle surgery. Cardiac risk assessment is requested. The patient denies any chest pain, dyspnea, palpitations, lower extremity edema, orthopnea, PND, dizziness, near syncope or syncope. He has been very active up until his ankle injury and had been exercising with no issues.  ? ?Primary Care Physician: Libby Maw, MD ?Primary Cardiology: Dr. Radford Pax ? ?Past Medical History:  ?Diagnosis Date  ? Allergy   ? Arthritis   ? CAD (coronary artery disease), native coronary artery 01/25/2014  ? S/p anterior MI with 90% LAD and occluded LCx s/p PCI of LAD and LCx with residual 30-40% stenosis of the RCA and normal LVF with lateral AK.    ? CHF (congestive heart failure) ([place])   ? Chicken pox   ? Hyperlipidemia with target LDL less than 70 10/19/2015  ? MI, old   ? Obesity (BMI 30-39.9) 10/19/2015  ? OSA (obstructive sleep apnea) 10/19/2015  ? Sleep apnea   ? ? ?Past Surgical History:  ?Procedure Laterality Date  ? CARDIAC CATHETERIZATION    ? CORONARY ANGIOPLASTY  01/25/2014  ? PCI of LCx and LAD  ? KNEE SURGERY    ? NASAL SINUS SURGERY    ? ? ?Current Outpatient Medications  ?Medication Sig Dispense Refill  ? aspirin 81 MG tablet Take 81 mg  by mouth daily.    ? celecoxib (CELEBREX) 200 MG capsule Take 200 mg by mouth daily.    ? Coenzyme Q10 (CO Q-10) 200 MG CAPS Take 1 capsule by mouth daily.    ? oxyCODONE (OXY IR/ROXICODONE) 5 MG immediate release tablet Take 1 tablet by mouth as needed.    ? sildenafil (REVATIO) 20 MG tablet Take 1-3 pills 45 minutes prior to intercourse as needed daily. ?Must not use with nitrates. 50 tablet 0  ? lisinopril (ZESTRIL) 5 MG tablet Take 1 tablet (5 mg total) by mouth daily. 90 tablet 2  ? rosuvastatin (CRESTOR) 40 MG tablet Take 1 tablet (40 mg total) by mouth daily. 90 tablet 2  ? ?Current Facility-Administered Medications  ?Medication Dose Route Frequency Provider Last Rate Last Admin  ? 0.9 %  sodium chloride infusion  500 mL Intravenous Once [place], MD      ? ? ?Allergies  ?Allergen Reactions  ? Penicillins Rash  ?  Also itching  ? ? ?Social History  ? ?Socioeconomic History  ? Marital status: Married  ?  Spouse name: Not on file  ? Number of children: 3  ? Years of education: Not on file  ? Highest education level: Not on file  ?Occupational History  ? Occupation: self employed  ?Tobacco Use  ? Smoking status: Former  ?  Types: Cigarettes  ?  Quit date: 17  ?  Years since quitting: 33.1  ? Smokeless tobacco: Never  ?Vaping Use  ? Vaping Use: Never used  ?Substance and Sexual Activity  ? Alcohol use: Yes  ?  Comment: occasionally  ? Drug use: No  ? Sexual activity: Not on file  ?Other Topics Concern  ? Not on file  ?Social History Narrative  ? Not on file  ? ?Social Determinants of Health  ? ?Financial Resource Strain: Not on file  ?Food Insecurity: Not on file  ?Transportation Needs: Not on file  ?Physical Activity: Not on file  ?Stress: Not on file  ?Social Connections: Not on file  ?Intimate Partner Violence: Not on file  ? ? ?Family History  ?Problem Relation Age of Onset  ? Hyperlipidemia Mother   ? Leukemia Father   ? Arthritis Father   ? Heart attack Maternal Grandfather   ? Heart disease  Maternal Grandfather   ? Hyperlipidemia Maternal Grandfather   ? Stroke Maternal Grandfather   ? Hyperlipidemia Maternal Grandmother   ? Colon cancer Neg Hx   ? Liver cancer Neg Hx   ? Stomach cancer Neg Hx   ? Rectal cancer Neg Hx   ? Esophageal cancer Neg Hx   ? ? ?Review of Systems:  As stated in the HPI and otherwise negative.  ? ?BP 130/68   Pulse 71   Ht 5\' 11"  (1.803 m)   Wt 285 lb (129.3 kg)   SpO2 94%   BMI 39.75 kg/m?  ? ?Physical Examination: ?General: Well developed, well nourished, NAD  ?HEENT: OP clear, mucus membranes moist  ?SKIN: warm, dry. No rashes. ?Neuro: No focal deficits  ?Musculoskeletal: Muscle strength 5/5 all ext  ?Psychiatric: Mood and affect normal  ?Neck: No JVD, no carotid bruits, no thyromegaly, no lymphadenopathy.  ?Lungs:Clear bilaterally, no wheezes, rhonci, crackles ?Cardiovascular: Regular rate and rhythm. Soft, systolic murmur.  ?Abdomen:Soft. Bowel sounds present. Non-tender.  ?Extremities: No lower extremity edema. Pulses are 2 + in the bilateral DP/PT. ? ?EKG:  EKG is ordered today. ?The ekg ordered today demonstrates sinus ? ?Recent Labs: ?No results found for requested labs within last 8760 hours.  ? ?Lipid Panel ?   ?Component Value Date/Time  ? CHOL 149 11/09/2017 0828  ? TRIG 81 11/09/2017 0828  ? HDL 45 11/09/2017 0828  ? CHOLHDL 3.3 11/09/2017 0828  ? CHOLHDL 3.1 10/25/2015 0731  ? VLDL 24 10/25/2015 0731  ? [place] 88 11/09/2017 0828  ? ?  ?Wt Readings from Last 3 Encounters:  ?09/13/21 285 lb (129.3 kg)  ?04/04/19 270 lb (122.5 kg)  ?03/14/19 270 lb (122.5 kg)  ?  ?Assessment and Plan:  ? ?1. CAD without angina: He has no chest pain. Will continue ASA, statin. Consider echo at f/u visit with Dr. Radford Pax ? ?2. HTN: BP controlled. Continue Lisinopril.  ? ?3. Pre-operative cardiovascular risk assessment: He has no signs or symptoms suggestive of angina, CHF or arrhythmias. He can proceed with his planned surgical procedure.  ? ?Labs/ tests ordered today include:   ? ?Orders Placed This Encounter  ?Procedures  ? EKG 12-Lead  ? ?Disposition:   F/U with Dr. Radford Pax in 6 months.  ? ?Signed, ?Lauree Chandler, MD ?09/13/2021 9:23 AM    ?[facility] ?[address], [place], [place]  [address] ?Phone: [phone]; Fax: [phone]  ? ? ?

## 2021-09-12 NOTE — Telephone Encounter (Signed)
CORRECTION: ANESTHESIA IS GENERAL ?

## 2021-09-13 ENCOUNTER — Ambulatory Visit (INDEPENDENT_AMBULATORY_CARE_PROVIDER_SITE_OTHER): Payer: BC Managed Care – PPO | Admitting: Cardiovascular Disease

## 2021-09-13 ENCOUNTER — Encounter: Payer: Self-pay | Admitting: Cardiovascular Disease

## 2021-09-13 ENCOUNTER — Other Ambulatory Visit: Payer: Self-pay

## 2021-09-13 VITALS — BP 130/68 | HR 71 | Ht 71.0 in | Wt 285.0 lb

## 2021-09-13 DIAGNOSIS — I2510 Atherosclerotic heart disease of native coronary artery without angina pectoris: Secondary | ICD-10-CM | POA: Diagnosis not present

## 2021-09-13 DIAGNOSIS — I10 Essential (primary) hypertension: Secondary | ICD-10-CM | POA: Diagnosis not present

## 2021-09-13 DIAGNOSIS — Z01810 Encounter for preprocedural cardiovascular examination: Secondary | ICD-10-CM | POA: Diagnosis not present

## 2021-09-13 MED ORDER — ROSUVASTATIN CALCIUM 40 MG PO TABS: 40.0000 mg | ORAL_TABLET | Freq: Every day | ORAL | 2 refills | Status: DC

## 2021-09-13 MED ORDER — LISINOPRIL 5 MG PO TABS: 5.0000 mg | ORAL_TABLET | Freq: Every day | ORAL | 2 refills | Status: DC

## 2021-09-13 NOTE — Patient Instructions (Signed)
Medication Instructions:  ?No changes ?*If you need a refill on your cardiac medications before your next appointment, please call your pharmacy* ? ? ?Lab Work: ?none ? ? ?Testing/Procedures: ?none ? ? ?Follow-Up: ?At [facility], you and your health needs are our priority.  As part of our continuing mission to provide you with exceptional heart care, we have created designated Provider Care Teams.  These Care Teams include your primary Cardiologist (physician) and Advanced Practice Providers (APPs -  Physician Assistants and Nurse Practitioners) who all work together to provide you with the care you need, when you need it. ? ? ?Your next appointment:   ?6 month(s) ? ?The format for your next appointment:   ?In Person ? ?Provider:   ?Fransico Him, MD   ? ? ?

## 2021-09-16 ENCOUNTER — Encounter (HOSPITAL_COMMUNITY): Payer: Self-pay | Admitting: Orthopedic Surgery

## 2021-09-16 ENCOUNTER — Other Ambulatory Visit: Payer: Self-pay

## 2021-09-16 NOTE — Progress Notes (Signed)
Surgery orders requested via Epic inbox. °

## 2021-09-16 NOTE — Progress Notes (Addendum)
COVID swab appointment: N/A ? ?COVID Vaccine Completed:  No ?Date COVID Vaccine completed: ?Has received booster: ?COVID vaccine manufacturer: [place]  ? ?Date of COVID positive in last 90 days:  No ? ?PCP - Abelino Derrick, MD ?Cardiologist - Fransico Him, MD ? ?Cardiac clearance in Epic dated 09-13-21 by Dr. Angelena Form ? ?Chest x-ray - N/A ?EKG - N/A ?Stress Test - greater than 2 years Epic ?ECHO - greater than 2 years Epic ?Cardiac Cath - greater than 2 years Epic ?Pacemaker/ICD device last checked: ?Spinal Cord Stimulator: ? ?Bowel Prep - N/A ? ?Sleep Study - Yes, +sleep apnea ?CPAP - Yes ? ?Fasting Blood Sugar - N/A ?Checks Blood Sugar _____ times a day ? ?Blood Thinner Instructions: ?Aspirin Instructions:  ASA 81 mg.  Per patient to stop on 09-17-21 ?Last Dose: ? ?Activity level:   Can go up a flight of stairs and perform activities of daily living without stopping and without symptoms of chest pain or shortness of breath. ?   ?Anesthesia review:  CAD, Hx of MI, HTN, OSA with CPAP ? ?Patient denies shortness of breath, fever, cough and chest pain at PAT appointment (completed over the phone) ? ?Patient verbalized understanding of instructions that were given to them at the PAT appointment. Patient was also instructed that they will need to review over the PAT instructions again at home before surgery.  ?

## 2021-09-17 NOTE — Progress Notes (Signed)
Anesthesia Chart Review ? ? Case: [id number] Date/Time: 09/20/21 1145  ? Procedure: OPEN REDUCTION INTERNAL FIXATION (ORIF) ANKLE FRACTURE (Right: Ankle)  ? Anesthesia type: Choice  ? Pre-op diagnosis: RIGHT ANKLE FRACTURE  ? Location: [facility] ROOM 02 / [facility]  ? Surgeons: Earlie Server, MD  ? ?  ? ? ?DISCUSSION:60 y.o. former smoker with h/o CAD (stent to LAD and Circumflex 2015), CHF, OSA, right ankle fracture scheduled for above procedure 09/20/2021 with Dr. Earlie Server.  ? ?Pt last seen by cardiology 09/13/2021. Per OV note, "Pre-operative cardiovascular risk assessment: He has no signs or symptoms suggestive of angina, CHF or arrhythmias. He can proceed with his planned surgical procedure." ? ?Same day workup, labs DOS. ? ?Anticipate pt can proceed with planned procedure barring acute status change.   ?VS: Ht '5\' 11"'$  (1.803 m)   Wt 129.3 kg   BMI 39.75 kg/m?  ? ?PROVIDERS: ?Libby Maw, MD is PCP  ? ?Lauree Chandler, MD is Cardiologist  ?LABS:  labs DOS, SDW ?(all labs ordered are listed, but only abnormal results are displayed) ? ?Labs Reviewed - No data to display ? ? ?IMAGES: ? ? ?EKG: ?DOS ? ?CV: ?Echo 01/27/14 ?Summary:  ?Left ventricular ejection fraction, by visual estimation, is 60-65% ?Borderline evidence of left ventricular hypertrophy ?Moderate lateral myocardial infarction ?Mild tricuspid regurgitation is visualized ?Mild to moderate aortic valve sclerosis/calcification without any evidence of aortic stenosis.  ?Past Medical History:  ?Diagnosis Date  ? Allergy   ? Arthritis   ? CAD (coronary artery disease), native coronary artery 01/25/2014  ? S/p anterior MI with 90% LAD and occluded LCx s/p PCI of LAD and LCx with residual 30-40% stenosis of the RCA and normal LVF with lateral AK.    ? CHF (congestive heart failure) ([place])   ? Patient denies  ? Chicken pox   ? Hyperlipidemia with target LDL less than 70 10/19/2015  ? MI, old   ? Obesity (BMI 30-39.9) 10/19/2015  ? OSA (obstructive  sleep apnea) 10/19/2015  ? Sleep apnea   ? ? ?Past Surgical History:  ?Procedure Laterality Date  ? CARDIAC CATHETERIZATION    ? COLONOSCOPY    ? CORONARY ANGIOPLASTY  01/25/2014  ? PCI of LCx and LAD  ? KNEE SURGERY    ? NASAL SINUS SURGERY    ? ? ?MEDICATIONS: ? 0.9 %  sodium chloride infusion  ? ? Coenzyme Q10 (COQ10) 200 MG CAPS  ? lisinopril (ZESTRIL) 5 MG tablet  ? oxyCODONE (OXY IR/ROXICODONE) 5 MG immediate release tablet  ? rosuvastatin (CRESTOR) 40 MG tablet  ? aspirin 81 MG tablet  ? celecoxib (CELEBREX) 200 MG capsule  ? sildenafil (REVATIO) 20 MG tablet  ? ? ?Konrad Felix Ward, PA-C ?[facility]Surgical [facility] ?[phone] ? ? ? ? ? ?

## 2021-09-17 NOTE — Anesthesia Preprocedure Evaluation (Addendum)
Anesthesia Evaluation  ?Patient identified by MRN, date of birth, ID band ?Patient awake ? ? ? ?Reviewed: ?Allergy & Precautions, NPO status , Patient's Chart, lab work & pertinent test results ? ?Airway ?Mallampati: II ? ?TM Distance: >3 FB ?Neck ROM: Full ? ? ? Dental ?no notable dental hx. ?(+) Teeth Intact, Dental Advisory Given ?  ?Pulmonary ?sleep apnea and Continuous Positive Airway Pressure Ventilation , former smoker,  ?  ?Pulmonary exam normal ?breath sounds clear to auscultation ? ? ? ? ? ? Cardiovascular ?hypertension, + CAD, + Past MI, + Cardiac Stents and +CHF  ?Normal cardiovascular exam ?Rhythm:Regular Rate:Normal ? ?Echo 01/27/14 ?Summary:  ?1. Left ventricular ejection fraction, by visual estimation, is 60-65% ?2. Borderline evidence of left ventricular hypertrophy ?3. Moderate lateral myocardial infarction ?4. Mild tricuspid regurgitation is visualized ?5. Mild to moderate aortic valve sclerosis/calcification without any evidence of aortic stenosis.  ?  ?Neuro/Psych ?negative neurological ROS ? negative psych ROS  ? GI/Hepatic ?negative GI ROS, Neg liver ROS,   ?Endo/Other  ?Morbid obesity (BMI 40) ? Renal/GU ?negative Renal ROS  ?negative genitourinary ?  ?Musculoskeletal ? ?(+) Arthritis ,  ? Abdominal ?  ?Peds ? Hematology ?negative hematology ROS ?(+)   ?Anesthesia Other Findings ? ? Reproductive/Obstetrics ? ?  ? ? ? ? ? ? ? ? ? ? ? ? ? ?  ?  ? ? ? ? ? ?Anesthesia Physical ?Anesthesia Plan ? ?ASA: 3 ? ?Anesthesia Plan: General and Regional  ? ?Post-op Pain Management: Regional block* and Tylenol PO (pre-op)*  ? ?Induction: Intravenous ? ?PONV Risk Score and Plan: 2 and Ondansetron, Dexamethasone and Midazolam ? ?Airway Management Planned: LMA ? ?Additional Equipment:  ? ?Intra-op Plan:  ? ?Post-operative Plan: Extubation in OR ? ?Informed Consent: I have reviewed the patients History and Physical, chart, labs and discussed the procedure including the risks,  benefits and alternatives for the proposed anesthesia with the patient or authorized representative who has indicated his/her understanding and acceptance.  ? ? ? ?Dental advisory given ? ?Plan Discussed with: CRNA ? ?Anesthesia Plan Comments: (See PAT note 09/17/2021, Konrad Felix Ward, PA-C)  ? ? ? ? ? ?Anesthesia Quick Evaluation ? ?

## 2021-09-19 ENCOUNTER — Ambulatory Visit: Payer: Self-pay | Admitting: Physician Assistant

## 2021-09-19 NOTE — H&P (Signed)
Peter Oconnor is an 61 y.o. male.   ?Chief Complaint: Right ankle pain ?HPI: He is 61 years old and comes in for his right ankle. He has a right ankle fracture that is displaced. Weber B fracture with disruption of the syndesmosis. He has widening of the medial joint space. He has  moderate swelling. He has had some stents. He is due to see  his cardiologist and his internist. He does take statin drugs as well as Brilinta and aspirin.    ? ?Past Medical History:  ?Diagnosis Date  ? Allergy   ? Arthritis   ? CAD (coronary artery disease), native coronary artery 01/25/2014  ? S/p anterior MI with 90% LAD and occluded LCx s/p PCI of LAD and LCx with residual 30-40% stenosis of the RCA and normal LVF with lateral AK.    ? CHF (congestive heart failure) ([place])   ? Patient denies  ? Chicken pox   ? Hyperlipidemia with target LDL less than 70 10/19/2015  ? MI, old   ? Obesity (BMI 30-39.9) 10/19/2015  ? OSA (obstructive sleep apnea) 10/19/2015  ? Sleep apnea   ? ? ?Past Surgical History:  ?Procedure Laterality Date  ? CARDIAC CATHETERIZATION    ? COLONOSCOPY    ? CORONARY ANGIOPLASTY  01/25/2014  ? PCI of LCx and LAD  ? KNEE SURGERY    ? NASAL SINUS SURGERY    ? ? ?Family History  ?Problem Relation Age of Onset  ? Hyperlipidemia Mother   ? Leukemia Father   ? Arthritis Father   ? Heart attack Maternal Grandfather   ? Heart disease Maternal Grandfather   ? Hyperlipidemia Maternal Grandfather   ? Stroke Maternal Grandfather   ? Hyperlipidemia Maternal Grandmother   ? Colon cancer Neg Hx   ? Liver cancer Neg Hx   ? Stomach cancer Neg Hx   ? Rectal cancer Neg Hx   ? Esophageal cancer Neg Hx   ? ?Social History:  reports that he quit smoking about 33 years ago. His smoking use included cigarettes. He has never used smokeless tobacco. He reports current alcohol use. He reports that he does not use drugs. ? ?Allergies:  ?Allergies  ?Allergen Reactions  ? Penicillins Itching and Rash  ? ? ?(Not in a hospital admission) ? ? ?No  results found for this or any previous visit (from the past 48 hour(s)). ?No results found. ? ?Review of Systems  ?Cardiovascular:  Positive for leg swelling.  ?Musculoskeletal:  Positive for arthralgias.  ?All other systems reviewed and are negative. ? ?There were no vitals taken for this visit. ?Physical Exam ?Constitutional:   ?   General: He is not in acute distress. ?   Appearance: Normal appearance.  ?HENT:  ?   Head: Normocephalic and atraumatic.  ?Eyes:  ?   Extraocular Movements: Extraocular movements intact.  ?   Pupils: Pupils are equal, round, and reactive to light.  ?Cardiovascular:  ?   Rate and Rhythm: Normal rate.  ?   Pulses: Normal pulses.  ?Pulmonary:  ?   Effort: Pulmonary effort is normal. No respiratory distress.  ?Abdominal:  ?   General: Abdomen is flat. There is no distension.  ?Musculoskeletal:  ?   Cervical back: Normal range of motion.  ?   Comments: RLE NVI, moderate swelling, TTP lateral ankle, no gross deformity,   ?Skin: ?   General: Skin is warm and dry.  ?   Findings: No erythema or rash.  ?Neurological:  ?  General: No focal deficit present.  ?   Mental Status: He is alert and oriented to person, place, and time.  ?Psychiatric:     ?   Mood and Affect: Mood normal.     ?   Behavior: Behavior normal.  ?  ? ?Assessment/Plan ?He has disruption of his syndesmosis with displaced lateral malleolus fracture in the medial clear space.  ? ?Open reduction and internal fixation lateral malleolus as well as open reduction and internal fixation of the syndesmosis. Arthrex with the mini C-arm, tight-rope.  Risks and benefits discussed. We will proceed onward with scheduling at some point in the future.  ? ?Chriss Czar, PA-C ?09/19/2021, 3:10 PM ? ? ? ?

## 2021-09-19 NOTE — H&P (View-Only) (Signed)
Peter Oconnor is an 61 y.o. male.   ?Chief Complaint: Right ankle pain ?HPI: He is 61 years old and comes in for his right ankle. He has a right ankle fracture that is displaced. Weber B fracture with disruption of the syndesmosis. He has widening of the medial joint space. He has  moderate swelling. He has had some stents. He is due to see  his cardiologist and his internist. He does take statin drugs as well as Brilinta and aspirin.    ? ?Past Medical History:  ?Diagnosis Date  ? Allergy   ? Arthritis   ? CAD (coronary artery disease), native coronary artery 01/25/2014  ? S/p anterior MI with 90% LAD and occluded LCx s/p PCI of LAD and LCx with residual 30-40% stenosis of the RCA and normal LVF with lateral AK.    ? CHF (congestive heart failure) ([place])   ? Patient denies  ? Chicken pox   ? Hyperlipidemia with target LDL less than 70 10/19/2015  ? MI, old   ? Obesity (BMI 30-39.9) 10/19/2015  ? OSA (obstructive sleep apnea) 10/19/2015  ? Sleep apnea   ? ? ?Past Surgical History:  ?Procedure Laterality Date  ? CARDIAC CATHETERIZATION    ? COLONOSCOPY    ? CORONARY ANGIOPLASTY  01/25/2014  ? PCI of LCx and LAD  ? KNEE SURGERY    ? NASAL SINUS SURGERY    ? ? ?Family History  ?Problem Relation Age of Onset  ? Hyperlipidemia Mother   ? Leukemia Father   ? Arthritis Father   ? Heart attack Maternal Grandfather   ? Heart disease Maternal Grandfather   ? Hyperlipidemia Maternal Grandfather   ? Stroke Maternal Grandfather   ? Hyperlipidemia Maternal Grandmother   ? Colon cancer Neg Hx   ? Liver cancer Neg Hx   ? Stomach cancer Neg Hx   ? Rectal cancer Neg Hx   ? Esophageal cancer Neg Hx   ? ?Social History:  reports that he quit smoking about 33 years ago. His smoking use included cigarettes. He has never used smokeless tobacco. He reports current alcohol use. He reports that he does not use drugs. ? ?Allergies:  ?Allergies  ?Allergen Reactions  ? Penicillins Itching and Rash  ? ? ?(Not in a hospital admission) ? ? ?No  results found for this or any previous visit (from the past 48 hour(s)). ?No results found. ? ?Review of Systems  ?Cardiovascular:  Positive for leg swelling.  ?Musculoskeletal:  Positive for arthralgias.  ?All other systems reviewed and are negative. ? ?There were no vitals taken for this visit. ?Physical Exam ?Constitutional:   ?   General: He is not in acute distress. ?   Appearance: Normal appearance.  ?HENT:  ?   Head: Normocephalic and atraumatic.  ?Eyes:  ?   Extraocular Movements: Extraocular movements intact.  ?   Pupils: Pupils are equal, round, and reactive to light.  ?Cardiovascular:  ?   Rate and Rhythm: Normal rate.  ?   Pulses: Normal pulses.  ?Pulmonary:  ?   Effort: Pulmonary effort is normal. No respiratory distress.  ?Abdominal:  ?   General: Abdomen is flat. There is no distension.  ?Musculoskeletal:  ?   Cervical back: Normal range of motion.  ?   Comments: RLE NVI, moderate swelling, TTP lateral ankle, no gross deformity,   ?Skin: ?   General: Skin is warm and dry.  ?   Findings: No erythema or rash.  ?Neurological:  ?  General: No focal deficit present.  ?   Mental Status: He is alert and oriented to person, place, and time.  ?Psychiatric:     ?   Mood and Affect: Mood normal.     ?   Behavior: Behavior normal.  ?  ? ?Assessment/Plan ?He has disruption of his syndesmosis with displaced lateral malleolus fracture in the medial clear space.  ? ?Open reduction and internal fixation lateral malleolus as well as open reduction and internal fixation of the syndesmosis. Arthrex with the mini C-arm, tight-rope.  Risks and benefits discussed. We will proceed onward with scheduling at some point in the future.  ? ?Chriss Czar, PA-C ?09/19/2021, 3:10 PM ? ? ? ?

## 2021-09-20 ENCOUNTER — Encounter (HOSPITAL_COMMUNITY)
Admission: RE | Disposition: A | Discharge status: Home / Self Care | Payer: Self-pay | Source: Home / Self Care | Attending: Orthopedic Surgery

## 2021-09-20 ENCOUNTER — Ambulatory Visit (HOSPITAL_COMMUNITY): Payer: BC Managed Care – PPO | Admitting: Physician Assistant

## 2021-09-20 ENCOUNTER — Ambulatory Visit (HOSPITAL_COMMUNITY)
Admission: RE | Admit: 2021-09-20 | Discharge: 2021-09-20 | Disposition: A | Discharge status: Home / Self Care | Payer: BC Managed Care – PPO | Attending: Orthopedic Surgery | Admitting: Orthopedic Surgery

## 2021-09-20 ENCOUNTER — Other Ambulatory Visit: Payer: Self-pay

## 2021-09-20 DIAGNOSIS — I509 Heart failure, unspecified: Secondary | ICD-10-CM | POA: Diagnosis not present

## 2021-09-20 DIAGNOSIS — Z79899 Other long term (current) drug therapy: Secondary | ICD-10-CM | POA: Diagnosis not present

## 2021-09-20 DIAGNOSIS — I110 Hypertensive heart disease with heart failure: Secondary | ICD-10-CM | POA: Insufficient documentation

## 2021-09-20 DIAGNOSIS — S93431A Sprain of tibiofibular ligament of right ankle, initial encounter: Secondary | ICD-10-CM | POA: Diagnosis not present

## 2021-09-20 DIAGNOSIS — X58XXXA Exposure to other specified factors, initial encounter: Secondary | ICD-10-CM | POA: Insufficient documentation

## 2021-09-20 DIAGNOSIS — S82891A Other fracture of right lower leg, initial encounter for closed fracture: Secondary | ICD-10-CM | POA: Diagnosis not present

## 2021-09-20 DIAGNOSIS — S8261XA Displaced fracture of lateral malleolus of right fibula, initial encounter for closed fracture: Secondary | ICD-10-CM | POA: Diagnosis not present

## 2021-09-20 DIAGNOSIS — I252 Old myocardial infarction: Secondary | ICD-10-CM | POA: Insufficient documentation

## 2021-09-20 DIAGNOSIS — I2510 Atherosclerotic heart disease of native coronary artery without angina pectoris: Secondary | ICD-10-CM | POA: Insufficient documentation

## 2021-09-20 DIAGNOSIS — M25571 Pain in right ankle and joints of right foot: Secondary | ICD-10-CM | POA: Diagnosis not present

## 2021-09-20 DIAGNOSIS — Z7982 Long term (current) use of aspirin: Secondary | ICD-10-CM | POA: Insufficient documentation

## 2021-09-20 DIAGNOSIS — Z7901 Long term (current) use of anticoagulants: Secondary | ICD-10-CM | POA: Diagnosis not present

## 2021-09-20 DIAGNOSIS — Z955 Presence of coronary angioplasty implant and graft: Secondary | ICD-10-CM | POA: Diagnosis not present

## 2021-09-20 DIAGNOSIS — Z87891 Personal history of nicotine dependence: Secondary | ICD-10-CM | POA: Diagnosis not present

## 2021-09-20 DIAGNOSIS — G4730 Sleep apnea, unspecified: Secondary | ICD-10-CM | POA: Diagnosis not present

## 2021-09-20 DIAGNOSIS — Z01818 Encounter for other preprocedural examination: Secondary | ICD-10-CM

## 2021-09-20 DIAGNOSIS — Z6841 Body Mass Index (BMI) 40.0 and over, adult: Secondary | ICD-10-CM | POA: Insufficient documentation

## 2021-09-20 DIAGNOSIS — G8918 Other acute postprocedural pain: Secondary | ICD-10-CM | POA: Diagnosis not present

## 2021-09-20 HISTORY — PX: ORIF ANKLE FRACTURE: SHX5408

## 2021-09-20 LAB — CBC
HCT: 43.1 % (ref 39.0–52.0)
Hemoglobin: 14.5 g/dL (ref 13.0–17.0)
MCH: 31.3 pg (ref 26.0–34.0)
MCHC: 33.6 g/dL (ref 30.0–36.0)
MCV: 93.1 fL (ref 80.0–100.0)
Platelets: 214 10*3/uL (ref 150–400)
RBC: 4.63 MIL/uL (ref 4.22–5.81)
RDW: 13.3 % (ref 11.5–15.5)
WBC: 6.1 10*3/uL (ref 4.0–10.5)
nRBC: 0 % (ref 0.0–0.2)

## 2021-09-20 LAB — BASIC METABOLIC PANEL
Anion gap: 10 (ref 5–15)
BUN: 17 mg/dL (ref 6–20)
CO2: 23 mmol/L (ref 22–32)
Calcium: 9.4 mg/dL (ref 8.9–10.3)
Chloride: 102 mmol/L (ref 98–111)
Creatinine, Ser: 0.59 mg/dL — ABNORMAL LOW (ref 0.61–1.24)
GFR, Estimated: >60 mL/min (ref >60)
Glucose, Bld: 110 mg/dL — ABNORMAL HIGH (ref 70–99)
Potassium: 4 mmol/L (ref 3.5–5.1)
Sodium: 135 mmol/L (ref 135–145)

## 2021-09-20 SURGERY — OPEN REDUCTION INTERNAL FIXATION (ORIF) ANKLE FRACTURE
Anesthesia: Regional | Site: Ankle | Laterality: Right

## 2021-09-20 MED ORDER — LACTATED RINGERS IV SOLN: INTRAVENOUS | Status: DC

## 2021-09-20 MED ORDER — LIDOCAINE HCL (PF) 2 % IJ SOLN
INTRAMUSCULAR | Status: AC
  Filled 2021-09-20: qty 5

## 2021-09-20 MED ORDER — CELECOXIB 200 MG PO CAPS: 200.0000 mg | ORAL_CAPSULE | Freq: Two times a day (BID) | ORAL | 0 refills | Status: DC

## 2021-09-20 MED ORDER — SODIUM CHLORIDE 0.9 % IV SOLN: INTRAVENOUS | Status: DC

## 2021-09-20 MED ORDER — DEXAMETHASONE SODIUM PHOSPHATE 10 MG/ML IJ SOLN
INTRAMUSCULAR | Status: DC | PRN
  Administered 2021-09-20: 10 mg via INTRAVENOUS

## 2021-09-20 MED ORDER — ACETAMINOPHEN 500 MG PO TABS
1000.0000 mg | ORAL_TABLET | Freq: Once | ORAL | Status: AC
  Administered 2021-09-20: 1000 mg via ORAL
  Filled 2021-09-20: qty 2

## 2021-09-20 MED ORDER — DEXAMETHASONE SODIUM PHOSPHATE 10 MG/ML IJ SOLN
INTRAMUSCULAR | Status: AC
  Filled 2021-09-20: qty 1

## 2021-09-20 MED ORDER — CHLORHEXIDINE GLUCONATE 0.12 % MT SOLN: 15.0000 mL | Freq: Once | OROMUCOSAL | Status: AC

## 2021-09-20 MED ORDER — ORAL CARE MOUTH RINSE
15.0000 mL | Freq: Once | OROMUCOSAL | Status: AC
  Administered 2021-09-20: 15 mL via OROMUCOSAL

## 2021-09-20 MED ORDER — ROPIVACAINE HCL 5 MG/ML IJ SOLN
INTRAMUSCULAR | Status: DC | PRN
  Administered 2021-09-20: 30 mL via PERINEURAL
  Administered 2021-09-20: 20 mL via PERINEURAL

## 2021-09-20 MED ORDER — VANCOMYCIN HCL IN DEXTROSE 1-5 GM/200ML-% IV SOLN
1000.0000 mg | INTRAVENOUS | Status: AC
  Administered 2021-09-20: 1000 mg via INTRAVENOUS
  Filled 2021-09-20: qty 200

## 2021-09-20 MED ORDER — ASPIRIN 81 MG PO TABS: 81.0000 mg | ORAL_TABLET | Freq: Two times a day (BID) | ORAL | 0 refills | Status: AC

## 2021-09-20 MED ORDER — FENTANYL CITRATE PF 50 MCG/ML IJ SOSY
50.0000 ug | PREFILLED_SYRINGE | INTRAMUSCULAR | Status: DC
  Administered 2021-09-20: 100 ug via INTRAVENOUS
  Filled 2021-09-20: qty 2

## 2021-09-20 MED ORDER — FENTANYL CITRATE (PF) 100 MCG/2ML IJ SOLN
INTRAMUSCULAR | Status: AC
  Filled 2021-09-20: qty 2

## 2021-09-20 MED ORDER — LIDOCAINE 2% (20 MG/ML) 5 ML SYRINGE
INTRAMUSCULAR | Status: DC | PRN
  Administered 2021-09-20: 20 mg via INTRAVENOUS

## 2021-09-20 MED ORDER — FENTANYL CITRATE (PF) 100 MCG/2ML IJ SOLN
INTRAMUSCULAR | Status: DC | PRN
  Administered 2021-09-20 (×4): 50 ug via INTRAVENOUS

## 2021-09-20 MED ORDER — OXYCODONE HCL 5 MG PO TABS: 5.0000 mg | ORAL_TABLET | ORAL | 0 refills | Status: DC | PRN

## 2021-09-20 MED ORDER — FENTANYL CITRATE PF 50 MCG/ML IJ SOSY: 25.0000 ug | PREFILLED_SYRINGE | INTRAMUSCULAR | Status: DC | PRN

## 2021-09-20 MED ORDER — 0.9 % SODIUM CHLORIDE (POUR BTL) OPTIME
TOPICAL | Status: DC | PRN
  Administered 2021-09-20: 1000 mL

## 2021-09-20 MED ORDER — DEXAMETHASONE SODIUM PHOSPHATE 10 MG/ML IJ SOLN
INTRAMUSCULAR | Status: DC | PRN
  Administered 2021-09-20 (×2): 5 mg

## 2021-09-20 MED ORDER — METHOCARBAMOL 500 MG PO TABS: 500.0000 mg | ORAL_TABLET | Freq: Four times a day (QID) | ORAL | 0 refills | Status: DC | PRN

## 2021-09-20 MED ORDER — PROPOFOL 10 MG/ML IV BOLUS
INTRAVENOUS | Status: DC | PRN
  Administered 2021-09-20: 200 mg via INTRAVENOUS

## 2021-09-20 MED ORDER — ONDANSETRON HCL 4 MG/2ML IJ SOLN
INTRAMUSCULAR | Status: AC
  Filled 2021-09-20: qty 2

## 2021-09-20 MED ORDER — ONDANSETRON HCL 4 MG/2ML IJ SOLN
INTRAMUSCULAR | Status: DC | PRN
  Administered 2021-09-20: 4 mg via INTRAVENOUS

## 2021-09-20 MED ORDER — MIDAZOLAM HCL 2 MG/2ML IJ SOLN
1.0000 mg | INTRAMUSCULAR | Status: DC
  Administered 2021-09-20: 2 mg via INTRAVENOUS
  Filled 2021-09-20: qty 2

## 2021-09-20 MED ORDER — ACETAMINOPHEN 325 MG PO TABS: 650.0000 mg | ORAL_TABLET | Freq: Four times a day (QID) | ORAL | 2 refills | Status: DC | PRN

## 2021-09-20 MED ORDER — ACETAMINOPHEN 500 MG PO TABS: 1000.0000 mg | ORAL_TABLET | Freq: Once | ORAL | Status: DC

## 2021-09-20 MED ORDER — PROPOFOL 10 MG/ML IV BOLUS
INTRAVENOUS | Status: AC
  Filled 2021-09-20: qty 20

## 2021-09-20 SURGICAL SUPPLY — 52 items
BAG COUNTER SPONGE SURGICOUNT (BAG) IMPLANT
BAG SURGICOUNT SPONGE COUNTING (BAG)
BAG ZIPLOCK 12X15 (MISCELLANEOUS) ×3 IMPLANT
BIT DRILL 2 CANN GRADUATED (BIT) ×2 IMPLANT
BIT DRILL 2.5 CANN ENDOSCOPIC (BIT) ×2 IMPLANT
BNDG ELASTIC 6X10 VLCR STRL LF (GAUZE/BANDAGES/DRESSINGS) ×2 IMPLANT
BNDG GAUZE ELAST 4 BULKY (GAUZE/BANDAGES/DRESSINGS) ×2 IMPLANT
CLOSURE WOUND 1/2 X4 (GAUZE/BANDAGES/DRESSINGS) ×1
COVER SURGICAL LIGHT HANDLE (MISCELLANEOUS) ×3 IMPLANT
CUFF TOURN SGL QUICK 34 (TOURNIQUET CUFF) ×2
CUFF TRNQT CYL 34X4.125X (TOURNIQUET CUFF) ×1 IMPLANT
DRAPE C-ARM 42X120 X-RAY (DRAPES) ×3 IMPLANT
DRAPE OEC MINIVIEW 54X84 (DRAPES) ×3 IMPLANT
DRAPE U-SHAPE 47X51 STRL (DRAPES) ×3 IMPLANT
DRSG ADAPTIC 3X8 NADH LF (GAUZE/BANDAGES/DRESSINGS) ×3 IMPLANT
DRSG PAD ABDOMINAL 8X10 ST (GAUZE/BANDAGES/DRESSINGS) ×3 IMPLANT
DURAPREP 26ML APPLICATOR (WOUND CARE) ×3 IMPLANT
ELECT REM PT RETURN 15FT ADLT (MISCELLANEOUS) ×3 IMPLANT
GAUZE SPONGE 4X4 12PLY STRL (GAUZE/BANDAGES/DRESSINGS) ×3 IMPLANT
GAUZE XEROFORM 1X8 LF (GAUZE/BANDAGES/DRESSINGS) ×2 IMPLANT
GLOVE SRG 8 PF TXTR STRL LF DI (GLOVE) ×1 IMPLANT
GLOVE SURG ENC MOIS LTX SZ7.5 (GLOVE) ×3 IMPLANT
GLOVE SURG ORTHO LTX SZ8 (GLOVE) ×3 IMPLANT
GLOVE SURG POLYISO LF SZ7.5 (GLOVE) ×3 IMPLANT
GLOVE SURG UNDER POLY LF SZ8 (GLOVE) ×2
GOWN STRL REUS W/TWL XL LVL3 (GOWN DISPOSABLE) ×6 IMPLANT
IMPL TIGHTROP W/DRV K-LESS (Anchor) IMPLANT
IMPLANT TIGHTROPE W/DRV K-LESS (Anchor) ×3 IMPLANT
K-WIRE BB-TAK (WIRE) ×12
KIT BASIN OR (CUSTOM PROCEDURE TRAY) ×3 IMPLANT
KIT TURNOVER KIT A (KITS) IMPLANT
KWIRE BB-TAK (WIRE) IMPLANT
MANIFOLD NEPTUNE II (INSTRUMENTS) ×3 IMPLANT
PACK ORTHO EXTREMITY (CUSTOM PROCEDURE TRAY) ×3 IMPLANT
PAD CAST 4YDX4 CTTN HI CHSV (CAST SUPPLIES) ×2 IMPLANT
PADDING CAST COTTON 4X4 STRL (CAST SUPPLIES) ×4
PADDING CAST SYN 6 (CAST SUPPLIES) ×2
PADDING CAST SYNTHETIC 6X4 NS (CAST SUPPLIES) IMPLANT
PENCIL SMOKE EVACUATOR (MISCELLANEOUS) IMPLANT
PLATE DISTAL FIB RT 6H (Plate) ×2 IMPLANT
PROTECTOR NERVE ULNAR (MISCELLANEOUS) ×3 IMPLANT
SCREW COMP KREULOCK 2.7X16 (Screw) ×4 IMPLANT
SCREW COMP KREULOCK 2.7X18 (Screw) ×4 IMPLANT
SCREW LOW PROFILE 3.5X14 (Screw) ×8 IMPLANT
SCREW NON LOCK 3.5X24MM (Screw) ×2 IMPLANT
STRIP CLOSURE SKIN 1/2X4 (GAUZE/BANDAGES/DRESSINGS) ×2 IMPLANT
SUT MNCRL AB 4-0 PS2 18 (SUTURE) ×3 IMPLANT
SUT VIC AB 1 CT1 27 (SUTURE) ×4
SUT VIC AB 1 CT1 27XBRD ANTBC (SUTURE) ×2 IMPLANT
SUT VIC AB 2-0 CT1 27 (SUTURE) ×2
SUT VIC AB 2-0 CT1 TAPERPNT 27 (SUTURE) ×1 IMPLANT
TOWEL OR 17X26 10 PK STRL BLUE (TOWEL DISPOSABLE) ×6 IMPLANT

## 2021-09-20 NOTE — Op Note (Signed)
NAME: Peter Oconnor, Peter Oconnor ?MEDICAL RECORD NO: [id number] ?ACCOUNT NO: [id number] ?DATE OF BIRTH: [birth year] ?FACILITY: [facility] ?LOCATION: [facility] ?PHYSICIAN: W D. Valeta Harms., MD ? ?Operative Report  ? ?DATE OF PROCEDURE: 09/20/2021 ? ?PREOPERATIVE DIAGNOSES: ?1. Displaced lateral malleolus fracture. ?2. Disruption syndesmosis, right ankle. ? ?POSTOPERATIVE DIAGNOSES: ?1. Displaced lateral malleolus fracture. ?2. Disruption syndesmosis, right ankle. ? ?OPERATION: ?1. Open reduction internal fixation of lateral malleolus fracture (6-hole Arthrex locking plate). ?2. Open reduction internal fixation of syndesmosis with Arthrex TightRope. ? ?SURGEON:  W D. Valeta Harms., MD ? ?ASSISTANT:  [place]. ? ?ANESTHESIA: General with block. ? ?TOURNIQUET TIME:  Approximately 1 hour 30 minutes. ? ?DESCRIPTION OF PROCEDURE:  Supine position. Exsanguination of the leg, inflation of thigh tourniquet to 350.  Incision was made over a long oblique Weber type B fracture.  Provisional reduction was obtained with placement of a 6-hole Arthrex locking  ?plate.  We then fixed the plate to the bone with standard cortical 3.5 screws proximally, distally using the locking mechanism to obtain a flush fit on the bone  and anatomic reduction.  We then elected to lag the obliquity with  one 3.5 lag screw.  The  ?intervening hole above that, we used to fix the syndesmosis.  Specifically, drilling the appropriate size drill guide with the Arthrex system, confirming good positioning, relative to the proximal and distal direction on the tibial metaphysis.  Confirmed ? placement of the pin in the AP and lateral planes and then placed a TightRope device, noting restoration of the syndesmosis as well as narrowing to an anatomic reduction of the medial joint space.  Wound was irrigated, closed with interrupted Vicryl and ? nylon on the skin.  I placed in a lightly compressive sterile dressing and a bulky splint.  Tourniquet was released after application of the  dressing. The patient was taken to recovery room in stable condition. ? ? ?[facility] ?D: 09/20/2021 1:24:56 pm T: 09/20/2021 11:05:00 pm  ?JOB: [id number]/ [id number]  ?

## 2021-09-20 NOTE — Anesthesia Procedure Notes (Signed)
Anesthesia Regional Block: Popliteal block  ? ?Pre-Anesthetic Checklist: , timeout performed,  Correct Patient, Correct Site, Correct Laterality,  Correct Procedure, Correct Position, site marked,  Risks and benefits discussed,  Surgical consent,  Pre-op evaluation,  At surgeon's request and post-op pain management ? ?Laterality: Right ? ?Prep: Maximum Sterile Barrier Precautions used, chloraprep     ?  ?Needles:  ?Injection technique: Single-shot ? ?Needle Type: Echogenic Stimulator Needle   ? ? ?Needle Length: 9cm  ?Needle Gauge: 22  ? ? ? ?Additional Needles: ? ? ?Procedures:,,,, ultrasound used (permanent image in chart),,    ?Narrative:  ?Start time: 09/20/2021 10:55 AM ?End time: 09/20/2021 10:59 AM ?Injection made incrementally with aspirations every 5 mL. ? ?Performed by: Personally  ?Anesthesiologist: Freddrick March, MD ? ?Additional Notes: ?Monitors applied. No increased pain on injection. No increased resistance to injection. Injection made in 5cc increments. Good needle visualization. Patient tolerated procedure well.  ? ? ? ? ?

## 2021-09-20 NOTE — Anesthesia Postprocedure Evaluation (Signed)
Anesthesia Post Note ? ?Patient: Peter Oconnor ? ?Procedure(s) Performed: OPEN REDUCTION INTERNAL FIXATION (ORIF) ANKLE FRACTURE (Right: Ankle) ? ?  ? ?Patient location during evaluation: PACU ?Anesthesia Type: Regional and General ?Level of consciousness: awake and alert ?Pain management: pain level controlled ?Vital Signs Assessment: post-procedure vital signs reviewed and stable ?Respiratory status: spontaneous breathing, nonlabored ventilation, respiratory function stable and patient connected to nasal cannula oxygen ?Cardiovascular status: blood pressure returned to baseline and stable ?Postop Assessment: no apparent nausea or vomiting ?Anesthetic complications: no ? ? ?No notable events documented. ? ?Last Vitals:  ?Vitals:  ? 09/20/21 1415 09/20/21 1430  ?BP: (!) 120/50 131/68  ?Pulse: 72 69  ?Resp: 17 16  ?Temp:    ?SpO2: 94% 95%  ?  ?Last Pain:  ?Vitals:  ? 09/20/21 1430  ?TempSrc:   ?PainSc: 0-No pain  ? ? ?  ?  ?  ?  ?  ?  ? ?Icela Glymph L Dajane Valli ? ? ? ? ?

## 2021-09-20 NOTE — Discharge Instructions (Signed)
Diet: As you were doing prior to hospitalization  ? ?Activity: Increase activity slowly as tolerated  ?No lifting or driving for 6 weeks  ? ?Shower: May shower must keep splint dry. ? ?Dressing: keep splint clean and dry, leave in place until follow up. ? ?Weight Bearing: nonweight bearing right leg. Use a walker or  ?Crutches as instructed.  ? ?To prevent constipation: you may use a stool softener such as -  ?Colace ( over the counter) 100 mg by mouth twice a day  ?Drink plenty of fluids ( prune juice may be helpful) and high fiber foods  ?Miralax ( over the counter) for constipation as needed.  ? ?Precautions: If you experience chest pain or shortness of breath - call 911 immediately For transfer to the hospital emergency department!!  ?If you develop a fever greater that 101 F, purulent drainage from wound, increased redness or drainage from wound, or calf pain -- Call the office  ? ?Follow- Up Appointment: Please call for an appointment to be seen in 2 weeks  ?[facility] - [phone]  ?

## 2021-09-20 NOTE — Progress Notes (Signed)
AssistedDr. Chelsey Woodrum with right, ultrasound guided, adductor canal block. Side rails up, monitors on throughout procedure. See vital signs in flow sheet. Tolerated Procedure well.  

## 2021-09-20 NOTE — Anesthesia Procedure Notes (Signed)
Procedure Name: LMA Insertion ?Date/Time: 09/20/2021 11:53 AM ?Performed by: Maxwell Caul, CRNA ?Pre-anesthesia Checklist: Patient identified, Emergency Drugs available, Suction available and Patient being monitored ?Patient Re-evaluated:Patient Re-evaluated prior to induction ?Oxygen Delivery Method: Circle system utilized ?Preoxygenation: Pre-oxygenation with 100% oxygen ?Induction Type: IV induction ?LMA: LMA with gastric port inserted ?LMA Size: 4.0 ?Number of attempts: 1 ?Placement Confirmation: positive ETCO2 and breath sounds checked- equal and bilateral ?Tube secured with: Tape ?Dental Injury: Teeth and Oropharynx as per pre-operative assessment  ? ? ? ? ?

## 2021-09-20 NOTE — Transfer of Care (Signed)
Immediate Anesthesia Transfer of Care Note ? ?Patient: Peter Oconnor ? ?Procedure(s) Performed: OPEN REDUCTION INTERNAL FIXATION (ORIF) ANKLE FRACTURE (Right: Ankle) ? ?Patient Location: PACU ? ?Anesthesia Type:General ? ?Level of Consciousness: awake, alert  and oriented ? ?Airway & Oxygen Therapy: Patient Spontanous Breathing and Patient connected to face mask oxygen ? ?Post-op Assessment: Report given to RN and Post -op Vital signs reviewed and stable ? ?Post vital signs: Reviewed and stable ? ?Last Vitals:  ?Vitals Value Taken Time  ?BP 145/81 09/20/21 1358  ?Temp    ?Pulse 64 09/20/21 1359  ?Resp 13 09/20/21 1359  ?SpO2 98 % 09/20/21 1359  ?Vitals shown include unvalidated device data. ? ?Last Pain:  ?Vitals:  ? 09/20/21 1009  ?TempSrc: Oral  ?PainSc:   ?   ? ?  ? ?Complications: No notable events documented. ?

## 2021-09-20 NOTE — Interval H&P Note (Signed)
History and Physical Interval Note: ? ?09/20/2021 ?11:28 AM ? ?Peter Oconnor  has presented today for surgery, with the diagnosis of RIGHT ANKLE FRACTURE.  The various methods of treatment have been discussed with the patient and family. After consideration of risks, benefits and other options for treatment, the patient has consented to  Procedure(s): ?OPEN REDUCTION INTERNAL FIXATION (ORIF) ANKLE FRACTURE (Right) as a surgical intervention.  The patient's history has been reviewed, patient examined, no change in status, stable for surgery.  I have reviewed the patient's chart and labs.  Questions were answered to the patient's satisfaction.   ? ? ?W D Valeta Harms ? ? ?

## 2021-09-20 NOTE — Anesthesia Procedure Notes (Signed)
Anesthesia Regional Block: Adductor canal block  ? ?Pre-Anesthetic Checklist: , timeout performed,  Correct Patient, Correct Site, Correct Laterality,  Correct Procedure, Correct Position, site marked,  Risks and benefits discussed,  Surgical consent,  Pre-op evaluation,  At surgeon's request and post-op pain management ? ?Laterality: Right ? ?Prep: Maximum Sterile Barrier Precautions used, chloraprep     ?  ?Needles:  ?Injection technique: Single-shot ? ?Needle Type: Echogenic Stimulator Needle   ? ? ?Needle Length: 9cm  ?Needle Gauge: 22  ? ? ? ?Additional Needles: ? ? ?Procedures:,,,, ultrasound used (permanent image in chart),,    ?Narrative:  ?Start time: 09/20/2021 11:00 AM ?End time: 09/20/2021 11:03 AM ?Injection made incrementally with aspirations every 5 mL. ? ?Performed by: Personally  ?Anesthesiologist: Freddrick March, MD ? ?Additional Notes: ?Monitors applied. No increased pain on injection. No increased resistance to injection. Injection made in 5cc increments. Good needle visualization. Patient tolerated procedure well.  ? ? ? ? ?

## 2021-09-20 NOTE — Brief Op Note (Signed)
09/20/2021 ? ?1:54 PM ? ?PATIENT:  Peter Oconnor  61 y.o. male ? ?PRE-OPERATIVE DIAGNOSIS:  RIGHT ANKLE FRACTURE ? ?POST-OPERATIVE DIAGNOSIS:  RIGHT ANKLE FRACTURE ? ?PROCEDURE:  Procedure(s): ?OPEN REDUCTION INTERNAL FIXATION (ORIF) ANKLE FRACTURE (Right) ? ?SURGEON:  Surgeon(s) and Role: ?   Earlie Server, MD - Primary ? ?PHYSICIAN ASSISTANT: Chriss Czar, PA-C ? ?ASSISTANTS:   ? ?ANESTHESIA:   regional and general ? ?EBL:  minimal  ? ?BLOOD ADMINISTERED:none ? ?DRAINS: none  ? ?LOCAL MEDICATIONS USED:  NONE ? ?SPECIMEN:  No Specimen ? ?DISPOSITION OF SPECIMEN:  N/A ? ?COUNTS:  YES ? ?TOURNIQUET:   ?Total Tourniquet Time Documented: ?Calf (Right) - 90 minutes ?Total: Calf (Right) - 90 minutes ? ? ?DICTATION: .Other Dictation: Dictation Number unknown ? ?PLAN OF CARE: Discharge to home after PACU ? ?PATIENT DISPOSITION:  PACU - hemodynamically stable. ?  ?Delay start of Pharmacological VTE agent (>24hrs) due to surgical blood loss or risk of bleeding: yes ? ?

## 2021-09-23 ENCOUNTER — Encounter (HOSPITAL_COMMUNITY): Payer: Self-pay | Admitting: Orthopedic Surgery

## 2021-09-30 DIAGNOSIS — S93431A Sprain of tibiofibular ligament of right ankle, initial encounter: Secondary | ICD-10-CM | POA: Diagnosis not present

## 2021-09-30 DIAGNOSIS — S8261XA Displaced fracture of lateral malleolus of right fibula, initial encounter for closed fracture: Secondary | ICD-10-CM | POA: Diagnosis not present

## 2021-10-14 DIAGNOSIS — S8261XD Displaced fracture of lateral malleolus of right fibula, subsequent encounter for closed fracture with routine healing: Secondary | ICD-10-CM | POA: Diagnosis not present

## 2021-11-07 DIAGNOSIS — S8261XD Displaced fracture of lateral malleolus of right fibula, subsequent encounter for closed fracture with routine healing: Secondary | ICD-10-CM | POA: Diagnosis not present

## 2021-11-07 DIAGNOSIS — R262 Difficulty in walking, not elsewhere classified: Secondary | ICD-10-CM | POA: Diagnosis not present

## 2021-11-07 DIAGNOSIS — M25671 Stiffness of right ankle, not elsewhere classified: Secondary | ICD-10-CM | POA: Diagnosis not present

## 2021-11-07 DIAGNOSIS — M6281 Muscle weakness (generalized): Secondary | ICD-10-CM | POA: Diagnosis not present

## 2021-11-14 DIAGNOSIS — S8261XD Displaced fracture of lateral malleolus of right fibula, subsequent encounter for closed fracture with routine healing: Secondary | ICD-10-CM | POA: Diagnosis not present

## 2021-11-14 DIAGNOSIS — R262 Difficulty in walking, not elsewhere classified: Secondary | ICD-10-CM | POA: Diagnosis not present

## 2021-11-14 DIAGNOSIS — M25671 Stiffness of right ankle, not elsewhere classified: Secondary | ICD-10-CM | POA: Diagnosis not present

## 2021-11-14 DIAGNOSIS — M6281 Muscle weakness (generalized): Secondary | ICD-10-CM | POA: Diagnosis not present

## 2021-11-18 DIAGNOSIS — M25671 Stiffness of right ankle, not elsewhere classified: Secondary | ICD-10-CM | POA: Diagnosis not present

## 2021-11-18 DIAGNOSIS — R262 Difficulty in walking, not elsewhere classified: Secondary | ICD-10-CM | POA: Diagnosis not present

## 2021-11-18 DIAGNOSIS — S8261XD Displaced fracture of lateral malleolus of right fibula, subsequent encounter for closed fracture with routine healing: Secondary | ICD-10-CM | POA: Diagnosis not present

## 2021-11-18 DIAGNOSIS — M6281 Muscle weakness (generalized): Secondary | ICD-10-CM | POA: Diagnosis not present

## 2021-11-25 DIAGNOSIS — R262 Difficulty in walking, not elsewhere classified: Secondary | ICD-10-CM | POA: Diagnosis not present

## 2021-11-25 DIAGNOSIS — M6281 Muscle weakness (generalized): Secondary | ICD-10-CM | POA: Diagnosis not present

## 2021-11-25 DIAGNOSIS — M25671 Stiffness of right ankle, not elsewhere classified: Secondary | ICD-10-CM | POA: Diagnosis not present

## 2021-11-25 DIAGNOSIS — S8261XD Displaced fracture of lateral malleolus of right fibula, subsequent encounter for closed fracture with routine healing: Secondary | ICD-10-CM | POA: Diagnosis not present

## 2021-11-27 DIAGNOSIS — M25671 Stiffness of right ankle, not elsewhere classified: Secondary | ICD-10-CM | POA: Diagnosis not present

## 2021-11-27 DIAGNOSIS — M6281 Muscle weakness (generalized): Secondary | ICD-10-CM | POA: Diagnosis not present

## 2021-11-27 DIAGNOSIS — S8261XD Displaced fracture of lateral malleolus of right fibula, subsequent encounter for closed fracture with routine healing: Secondary | ICD-10-CM | POA: Diagnosis not present

## 2021-11-27 DIAGNOSIS — R262 Difficulty in walking, not elsewhere classified: Secondary | ICD-10-CM | POA: Diagnosis not present

## 2021-12-04 DIAGNOSIS — M25671 Stiffness of right ankle, not elsewhere classified: Secondary | ICD-10-CM | POA: Diagnosis not present

## 2021-12-04 DIAGNOSIS — R262 Difficulty in walking, not elsewhere classified: Secondary | ICD-10-CM | POA: Diagnosis not present

## 2021-12-04 DIAGNOSIS — M6281 Muscle weakness (generalized): Secondary | ICD-10-CM | POA: Diagnosis not present

## 2021-12-04 DIAGNOSIS — S8261XD Displaced fracture of lateral malleolus of right fibula, subsequent encounter for closed fracture with routine healing: Secondary | ICD-10-CM | POA: Diagnosis not present

## 2021-12-10 DIAGNOSIS — R262 Difficulty in walking, not elsewhere classified: Secondary | ICD-10-CM | POA: Diagnosis not present

## 2021-12-10 DIAGNOSIS — M6281 Muscle weakness (generalized): Secondary | ICD-10-CM | POA: Diagnosis not present

## 2021-12-10 DIAGNOSIS — M25671 Stiffness of right ankle, not elsewhere classified: Secondary | ICD-10-CM | POA: Diagnosis not present

## 2021-12-10 DIAGNOSIS — S8261XD Displaced fracture of lateral malleolus of right fibula, subsequent encounter for closed fracture with routine healing: Secondary | ICD-10-CM | POA: Diagnosis not present

## 2021-12-11 DIAGNOSIS — G4733 Obstructive sleep apnea (adult) (pediatric): Secondary | ICD-10-CM | POA: Diagnosis not present

## 2021-12-16 DIAGNOSIS — S8261XD Displaced fracture of lateral malleolus of right fibula, subsequent encounter for closed fracture with routine healing: Secondary | ICD-10-CM | POA: Diagnosis not present

## 2021-12-16 DIAGNOSIS — M25671 Stiffness of right ankle, not elsewhere classified: Secondary | ICD-10-CM | POA: Diagnosis not present

## 2021-12-16 DIAGNOSIS — R262 Difficulty in walking, not elsewhere classified: Secondary | ICD-10-CM | POA: Diagnosis not present

## 2021-12-16 DIAGNOSIS — M6281 Muscle weakness (generalized): Secondary | ICD-10-CM | POA: Diagnosis not present

## 2021-12-30 DIAGNOSIS — R262 Difficulty in walking, not elsewhere classified: Secondary | ICD-10-CM | POA: Diagnosis not present

## 2021-12-30 DIAGNOSIS — M25671 Stiffness of right ankle, not elsewhere classified: Secondary | ICD-10-CM | POA: Diagnosis not present

## 2021-12-30 DIAGNOSIS — S8261XD Displaced fracture of lateral malleolus of right fibula, subsequent encounter for closed fracture with routine healing: Secondary | ICD-10-CM | POA: Diagnosis not present

## 2021-12-30 DIAGNOSIS — M6281 Muscle weakness (generalized): Secondary | ICD-10-CM | POA: Diagnosis not present

## 2022-01-07 ENCOUNTER — Telehealth: Payer: Self-pay | Admitting: Cardiology

## 2022-01-10 DIAGNOSIS — G4733 Obstructive sleep apnea (adult) (pediatric): Secondary | ICD-10-CM | POA: Diagnosis not present

## 2022-02-18 ENCOUNTER — Telehealth: Payer: Self-pay | Admitting: Cardiology

## 2022-02-18 DIAGNOSIS — G4733 Obstructive sleep apnea (adult) (pediatric): Secondary | ICD-10-CM

## 2022-02-18 DIAGNOSIS — I2510 Atherosclerotic heart disease of native coronary artery without angina pectoris: Secondary | ICD-10-CM

## 2022-02-18 NOTE — Telephone Encounter (Signed)
Pt states they need a call back in regards to getting a new RX for a new CPAP machine sent over to his pharmacy  [facility]:  [phone]  He states they need it faxed to both of these numbers. Also states that his CPAP machine is no longer working and needs another.

## 2022-02-18 NOTE — Telephone Encounter (Signed)
Reached out to the patient and he states his cpap is making noises and about to stop working. He has not seen Dr Radford Pax since April 2019 and was to follow up with Dr Radford Pax in 1 year but never did now he wants a Rx to get a new cpap. I explained to him that he has to have a recent office note to send with his order. Patient has an appointment on 8/30 but he wants his order sent today because he states he can not be without his cpap stating the last time he went without his cpap for 24 hours he had a heart attack. Patient will send a mychart message to Dr Radford Pax pleading his case.

## 2022-02-19 ENCOUNTER — Encounter: Payer: Self-pay | Admitting: Cardiology

## 2022-02-19 ENCOUNTER — Telehealth (INDEPENDENT_AMBULATORY_CARE_PROVIDER_SITE_OTHER): Payer: BC Managed Care – PPO | Admitting: Cardiology

## 2022-02-19 VITALS — Ht 71.0 in | Wt 280.0 lb

## 2022-02-19 DIAGNOSIS — I2510 Atherosclerotic heart disease of native coronary artery without angina pectoris: Secondary | ICD-10-CM

## 2022-02-19 DIAGNOSIS — E785 Hyperlipidemia, unspecified: Secondary | ICD-10-CM

## 2022-02-19 DIAGNOSIS — G4733 Obstructive sleep apnea (adult) (pediatric): Secondary | ICD-10-CM

## 2022-02-19 DIAGNOSIS — I10 Essential (primary) hypertension: Secondary | ICD-10-CM | POA: Diagnosis not present

## 2022-02-19 NOTE — Addendum Note (Signed)
Addended by: Antonieta Iba on: 02/19/2022 10:23 AM   Modules accepted: Orders

## 2022-02-19 NOTE — Addendum Note (Signed)
Addended by: Freada Bergeron on: 02/19/2022 01:25 PM   Modules accepted: Orders

## 2022-02-19 NOTE — Telephone Encounter (Signed)
Per Dr Radford Pax, good AHI and compliance  Patient understands his AHI showed normal. Pt is aware and agreeable to normal results.

## 2022-02-19 NOTE — Telephone Encounter (Signed)
Per Dr Radford Pax, new ResMed auto CPAP from 8-14cm H2O.  Upon patient request DME selection is [facility]. Patient understands he will be contacted by [facility] to set up his cpap. Patient understands to call if [facility] does not contact him with new setup in a timely manner. Patient understands they will be called once confirmation has been received from [facility]/ that they have received their new machine to schedule 10 week follow up appointment.   [facility] notified of new cpap order  Please add to airview Patient was grateful for the call and thanked me.

## 2022-02-19 NOTE — Patient Instructions (Addendum)
Medication Instructions:  Your physician recommends that you continue on your current medications as directed. Please refer to the Current Medication list given to you today.  *If you need a refill on your cardiac medications before your next appointment, please call your pharmacy*  Lab Work: Fasting lipids, ALT and HgbA1C on Friday 8/11 between 7:15am and 5:00pm  If you have labs (blood work) drawn today and your tests are completely normal, you will receive your results only by: [facility] (if you have MyChart) OR A paper copy in the mail If you have any lab test that is abnormal or we need to change your treatment, we will call you to review the results.  Follow-Up: At [facility], you and your health needs are our priority.  As part of our continuing mission to provide you with exceptional heart care, we have created designated Provider Care Teams.  These Care Teams include your primary Cardiologist (physician) and Advanced Practice Providers (APPs -  Physician Assistants and Nurse Practitioners) who all work together to provide you with the care you need, when you need it.  Follow   Important Information About [place]

## 2022-02-19 NOTE — Progress Notes (Signed)
Virtual Visit via Video Note   Because of Peter Oconnor's co-morbid illnesses, he is at least at moderate risk for complications without adequate follow up.  This format is felt to be most appropriate for this patient at this time.  All issues noted in this document were discussed and addressed.  A limited physical exam was performed with this format.  Please refer to the patient's chart for his consent to telehealth for [name].   Date:  02/19/2022   ID:  Peter Oconnor, DOB Aug 28, 1960, MRN [id number] The patient was identified using 2 identifiers.  Patient Location: Home Provider Location: Home Office   PCP:  Libby Maw, [place] [facility] Cardiologist:  Fransico Him, MD     Evaluation Performed:  Follow-Up Visit  Chief Complaint:  CAD  History of Present Illness:    Peter Oconnor is a 61 y.o. male with with history of arthritis, tobacco abuse, HTN, CAD, hyperlipidemia and sleep apnea here today for cardiac follow up. He has been followed in our office by Dr. Radford Pax. He was last seen by Dr. Radford Pax in April 2019. He has an anterior MI in July 201 in [place]. Cardiac cath 2015 with mild RCA stenosis, severe LAD stenosis and occluded Circumflex. He had stenting of his LAD and Circumflex at that time. Echo in 2015 with LVEF=60-65%. He was seen in cardiology at [facility] in July 2021. Beta blocker stopped due to erectile dysfunction.  He was lost to follow-up but reestablish care in our office back in March and is now here for follow-up.  He is here today for followup and is doing well.  He denies any chest pain or pressure, SOB, DOE, PND, orthopnea, dizziness, palpitations or syncope.  He has chronic right ankle swelling from surgery earlier this year.  He is compliant with his meds and is tolerating meds with no SE.     Past Medical History:  Diagnosis Date   Allergy    Arthritis    CAD (coronary artery disease), native coronary artery  01/25/2014   S/p anterior MI with 90% LAD and occluded LCx s/p PCI of LAD and LCx with residual 30-40% stenosis of the RCA and normal LVF with lateral AK.     CHF (congestive heart failure) ([place])    Patient denies   Chicken pox    Hyperlipidemia with target LDL less than 70 10/19/2015   MI, old    Obesity (BMI 30-39.9) 10/19/2015   OSA (obstructive sleep apnea) 10/19/2015   Sleep apnea    Past Surgical History:  Procedure Laterality Date   CARDIAC CATHETERIZATION     COLONOSCOPY     CORONARY ANGIOPLASTY  01/25/2014   PCI of LCx and LAD   KNEE SURGERY     NASAL SINUS SURGERY     ORIF ANKLE FRACTURE Right 09/20/2021   Procedure: OPEN REDUCTION INTERNAL FIXATION (ORIF) ANKLE FRACTURE;  Surgeon: Earlie Server, MD;  Location: [facility];  Service: Orthopedics;  Laterality: Right;     Current Meds  Medication Sig   acetaminophen (TYLENOL) 325 MG tablet Take 2 tablets (650 mg total) by mouth every 6 (six) hours as needed.   aspirin EC 81 MG tablet Take 81 mg by mouth daily. Swallow whole.   lisinopril (ZESTRIL) 5 MG tablet Take 1 tablet (5 mg total) by mouth daily.   rosuvastatin (CRESTOR) 40 MG tablet Take 1 tablet (40 mg total) by mouth daily.   Current Facility-Administered Medications for the  02/19/22 encounter (Video Visit) with Sueanne Margarita, MD  Medication   0.9 %  sodium chloride infusion     Allergies:   Penicillins   Social History   Tobacco Use   Smoking status: Former    Types: Cigarettes    Quit date: 1990    Years since quitting: 33.6   Smokeless tobacco: Never  Vaping Use   Vaping Use: Never used  Substance Use Topics   Alcohol use: Yes    Comment: occasionally   Drug use: No     Family Hx: The patient's family history includes Arthritis in his father; Heart attack in his maternal grandfather; Heart disease in his maternal grandfather; Hyperlipidemia in his maternal grandfather, maternal grandmother, and mother; Leukemia in his father; Stroke in his maternal  grandfather. There is no history of Colon cancer, Liver cancer, Stomach cancer, Rectal cancer, or Esophageal cancer.  ROS:   Please see the history of present illness.     All other systems reviewed and are negative.   Prior CV studies:   The following studies were reviewed today:  None  Labs/Other Tests and Data Reviewed:    EKG:  No ECG reviewed.  Recent Labs: 09/20/2021: BUN 17; Creatinine, Ser 0.59; Hemoglobin 14.5; Platelets 214; Potassium 4.0; Sodium 135   Recent Lipid Panel Lab Results  Component Value Date/Time   CHOL 149 11/09/2017 08:28 AM   TRIG 81 11/09/2017 08:28 AM   HDL 45 11/09/2017 08:28 AM   CHOLHDL 3.3 11/09/2017 08:28 AM   CHOLHDL 3.1 10/25/2015 07:31 AM   LDLCALC 88 11/09/2017 08:28 AM    Wt Readings from Last 3 Encounters:  02/19/22 280 lb (127 kg)  09/16/21 285 lb (129.3 kg)  09/13/21 285 lb (129.3 kg)     Risk Assessment/Calculations:          Objective:    Vital Signs:  Ht '5\' 11"'$  (1.803 m)   Wt 280 lb (127 kg)   BMI 39.05 kg/m    VITAL SIGNS:  reviewed GEN:  no acute distress EYES:  sclerae anicteric, EOMI - Extraocular Movements Intact RESPIRATORY:  normal respiratory effort, symmetric expansion CARDIOVASCULAR:  no peripheral edema SKIN:  no rash, lesions or ulcers. MUSCULOSKELETAL:  no obvious deformities. NEURO:  alert and oriented x 3, no obvious focal deficit PSYCH:  normal affect  ASSESSMENT & PLAN:    ASCAD -s/p anterior MI in July 2015 in [place]. Cardiac cath 2015 with mild RCA stenosis, severe LAD stenosis and occluded Circumflex. He had stenting of his LAD and Circumflex at that time. -He denies any anginal symptoms -continue prescription drug management with ASA '81mg'$  daily and statin with PRN refills  2.  Hypertension -Continue prescription drug management with lisinopril 5 mg daily with as needed refills -I have personally reviewed and interpreted outside labs performed by patient's PCP which showed serum  creatinine 0.59 potassium 4 on 09/20/2021  3.  Hyperlipidemia -LDL goal less than 70 -Continue prescription drug management with rosuvastatin 40 mg daily with as needed refills -Check FLP and ALT  4. OSA - The patient is tolerating PAP therapy well without any problems. The PAP download performed by his DME was personally reviewed and interpreted by me today and showed an AHI of 1.4/hr on auto CPAP from 8-14 cm H2O with 97% compliance in using more than 4 hours nightly.  The patient has been using and benefiting from PAP use and will continue to benefit from therapy.  -he is having problems with his  PAP device over the past 48 hours.  After the machine is working for a few minutes it starts to whistle and thinks the blower is going back. He fells the pressure coming out is not as much as it was before.  -I will order him a new ResMed auto CPAP from 8-14cm H2O      Time:   Today, I have spent 15 minutes with the patient with telehealth technology discussing the above problems.     Medication Adjustments/Labs and Tests Ordered: Current medicines are reviewed at length with the patient today.  Concerns regarding medicines are outlined above.   Tests Ordered: No orders of the defined types were placed in this encounter.   Medication Changes: No orders of the defined types were placed in this encounter.   Follow Up:  after he gets new device  Signed, Fransico Him, MD  02/19/2022 10:15 AM    [place]

## 2022-02-21 ENCOUNTER — Other Ambulatory Visit: Payer: BC Managed Care – PPO

## 2022-02-21 DIAGNOSIS — G4733 Obstructive sleep apnea (adult) (pediatric): Secondary | ICD-10-CM | POA: Diagnosis not present

## 2022-02-24 ENCOUNTER — Encounter: Payer: Self-pay | Admitting: Cardiology

## 2022-02-24 ENCOUNTER — Other Ambulatory Visit: Payer: BC Managed Care – PPO

## 2022-02-24 DIAGNOSIS — E785 Hyperlipidemia, unspecified: Secondary | ICD-10-CM

## 2022-02-24 DIAGNOSIS — I10 Essential (primary) hypertension: Secondary | ICD-10-CM

## 2022-02-24 DIAGNOSIS — G4733 Obstructive sleep apnea (adult) (pediatric): Secondary | ICD-10-CM

## 2022-02-24 DIAGNOSIS — I2510 Atherosclerotic heart disease of native coronary artery without angina pectoris: Secondary | ICD-10-CM

## 2022-02-24 LAB — HEMOGLOBIN A1C
Est. average glucose Bld gHb Est-mCnc: 123 mg/dL
Hgb A1c MFr Bld: 5.9 % — ABNORMAL HIGH (ref 4.8–5.6)

## 2022-02-24 LAB — LIPID PANEL
Chol/HDL Ratio: 3.7 ratio (ref 0.0–5.0)
Cholesterol, Total: 185 mg/dL (ref 100–199)
HDL: 50 mg/dL (ref >39)
LDL Chol Calc (NIH): 111 mg/dL — ABNORMAL HIGH (ref 0–99)
Triglycerides: 137 mg/dL (ref 0–149)
VLDL Cholesterol Cal: 24 mg/dL (ref 5–40)

## 2022-02-24 LAB — ALT: ALT: 36 IU/L (ref 0–44)

## 2022-02-25 NOTE — Telephone Encounter (Signed)
-----   Message from Sueanne Margarita, MD sent at 02/24/2022 10:47 PM EDT ----- LDL not at goal - add Zetia '10mg'$  daily to his Crestor and repeat FLP and ALT in 8 weeks

## 2022-02-26 DIAGNOSIS — G4733 Obstructive sleep apnea (adult) (pediatric): Secondary | ICD-10-CM | POA: Diagnosis not present

## 2022-03-12 ENCOUNTER — Ambulatory Visit: Payer: BC Managed Care – PPO | Admitting: Cardiology

## 2022-03-24 DIAGNOSIS — G4733 Obstructive sleep apnea (adult) (pediatric): Secondary | ICD-10-CM | POA: Diagnosis not present

## 2022-03-29 DIAGNOSIS — G4733 Obstructive sleep apnea (adult) (pediatric): Secondary | ICD-10-CM | POA: Diagnosis not present

## 2022-04-23 DIAGNOSIS — G4733 Obstructive sleep apnea (adult) (pediatric): Secondary | ICD-10-CM | POA: Diagnosis not present

## 2022-04-28 DIAGNOSIS — G4733 Obstructive sleep apnea (adult) (pediatric): Secondary | ICD-10-CM | POA: Diagnosis not present

## 2022-05-20 ENCOUNTER — Encounter: Payer: Self-pay | Admitting: Cardiology

## 2022-05-20 ENCOUNTER — Ambulatory Visit: Payer: BC Managed Care – PPO | Attending: Cardiology | Admitting: Cardiology

## 2022-05-20 VITALS — HR 72 | Ht 71.0 in | Wt 280.0 lb

## 2022-05-20 DIAGNOSIS — I10 Essential (primary) hypertension: Secondary | ICD-10-CM | POA: Diagnosis not present

## 2022-05-20 DIAGNOSIS — G4733 Obstructive sleep apnea (adult) (pediatric): Secondary | ICD-10-CM

## 2022-05-20 DIAGNOSIS — E785 Hyperlipidemia, unspecified: Secondary | ICD-10-CM

## 2022-05-20 DIAGNOSIS — I2510 Atherosclerotic heart disease of native coronary artery without angina pectoris: Secondary | ICD-10-CM

## 2022-05-20 DIAGNOSIS — Z87891 Personal history of nicotine dependence: Secondary | ICD-10-CM

## 2022-05-20 NOTE — Progress Notes (Signed)
Virtual Visit via Video Note   Because of Peter Oconnor's co-morbid illnesses, he is at least at moderate risk for complications without adequate follow up.  This format is felt to be most appropriate for this patient at this time.  All issues noted in this document were discussed and addressed.  A limited physical exam was performed with this format.  Please refer to the patient's chart for his consent to telehealth for [facility].   Date:  05/20/2022   ID:  Peter Oconnor, DOB [birth year], MRN [id number] The patient was identified using 2 identifiers.  Patient Location: Home Provider Location: Home Office   PCP:  Libby Maw, [place] [facility] Cardiologist:  Fransico Him, MD     Evaluation Performed:  Follow-Up VisitHe  Chief Complaint:  CAD  History of Present Illness:    Peter Oconnor is a 61 y.o. male with with history of arthritis, tobacco abuse, HTN, CAD, hyperlipidemia and sleep apnea He has an anterior MI in July 201 in [place]. Cardiac cath 2015 with mild RCA stenosis, severe LAD stenosis and occluded Circumflex. He had stenting of his LAD and Circumflex at that time. Echo in 2015 with LVEF=60-65%. He was seen in cardiology at [facility] in July 2021. Beta blocker stopped due to erectile dysfunction.  He was lost to follow-up but reestablished care.    He is here today for followup and is doing well.  He denies any chest pain or pressure, SOB, DOE, PND, orthopnea, LE edema, dizziness, palpitations or syncope. He is compliant with his meds and is tolerating meds with no SE.    He is doing well with his PAP device and thinks that he has gotten used to it.  He tolerates the mask and feels the pressure is adequate.  Since going on PAP he feels rested in the am and has no significant daytime sleepiness.  He denies any significant mouth or nasal dryness or nasal congestion.  He does not think that he snores.    Past Medical History:   Diagnosis Date   Allergy    Arthritis    CAD (coronary artery disease), native coronary artery 01/25/2014   S/p anterior MI with 90% LAD and occluded LCx s/p PCI of LAD and LCx with residual 30-40% stenosis of the RCA and normal LVF with lateral AK.     CHF (congestive heart failure) ([place])    Patient denies   Chicken pox    Hyperlipidemia with target LDL less than 70 10/19/2015   MI, old    Obesity (BMI 30-39.9) 10/19/2015   OSA (obstructive sleep apnea) 10/19/2015   Sleep apnea    Past Surgical History:  Procedure Laterality Date   CARDIAC CATHETERIZATION     COLONOSCOPY     CORONARY ANGIOPLASTY  01/25/2014   PCI of LCx and LAD   KNEE SURGERY     NASAL SINUS SURGERY     ORIF ANKLE FRACTURE Right 09/20/2021   Procedure: OPEN REDUCTION INTERNAL FIXATION (ORIF) ANKLE FRACTURE;  Surgeon: Earlie Server, MD;  Location: [facility];  Service: Orthopedics;  Laterality: Right;     Current Meds  Medication Sig   aspirin EC 81 MG tablet Take 81 mg by mouth daily. Swallow whole.   lisinopril (ZESTRIL) 5 MG tablet Take 1 tablet (5 mg total) by mouth daily.   rosuvastatin (CRESTOR) 40 MG tablet Take 1 tablet (40 mg total) by mouth daily.   Current Facility-Administered Medications for the 05/20/22  encounter (Video Visit) with Sueanne Margarita, MD  Medication   0.9 %  sodium chloride infusion     Allergies:   Penicillins   Social History   Tobacco Use   Smoking status: Former    Types: Cigarettes    Quit date: 1990    Years since quitting: 33.8   Smokeless tobacco: Never  Vaping Use   Vaping Use: Never used  Substance Use Topics   Alcohol use: Yes    Comment: occasionally   Drug use: No     Family Hx: The patient's family history includes Arthritis in his father; Heart attack in his maternal grandfather; Heart disease in his maternal grandfather; Hyperlipidemia in his maternal grandfather, maternal grandmother, and mother; Leukemia in his father; Stroke in his maternal  grandfather. There is no history of Colon cancer, Liver cancer, Stomach cancer, Rectal cancer, or Esophageal cancer.  ROS:   Please see the history of present illness.     All other systems reviewed and are negative.   Prior CV studies:   The following studies were reviewed today:  None  Labs/Other Tests and Data Reviewed:    EKG:  No ECG reviewed.  Recent Labs: 09/20/2021: BUN 17; Creatinine, Ser 0.59; Hemoglobin 14.5; Platelets 214; Potassium 4.0; Sodium 135 02/24/2022: ALT 36   Recent Lipid Panel Lab Results  Component Value Date/Time   CHOL 185 02/24/2022 07:54 AM   TRIG 137 02/24/2022 07:54 AM   HDL 50 02/24/2022 07:54 AM   CHOLHDL 3.7 02/24/2022 07:54 AM   CHOLHDL 3.1 10/25/2015 07:31 AM   LDLCALC 111 (H) 02/24/2022 07:54 AM    Wt Readings from Last 3 Encounters:  05/20/22 280 lb (127 kg)  02/19/22 280 lb (127 kg)  09/16/21 285 lb (129.3 kg)     Risk Assessment/Calculations:          Objective:    Vital Signs:  Pulse 72   Ht '5\' 11"'$  (1.803 m)   Wt 280 lb (127 kg)   BMI 39.05 kg/m   Well nourished, well developed male in no acute distress. Well appearing, alert and conversant, regular work of breathing,  good skin color  Eyes- anicteric mouth- oral mucosa is pink  neuro- grossly intact skin- no apparent rash or lesions or cyanosis ASSESSMENT & PLAN:    ASCAD -s/p anterior MI in July 2015 in [place]. Cardiac cath 2015 with mild RCA stenosis, severe LAD stenosis and occluded Circumflex. He had stenting of his LAD and Circumflex at that time. -He denies any anginal -continue prescription management with aspirin 81 mg daily and high-dose statin therapy with as needed refills  2.  Hypertension -BP is controlled at home and at MD visits -Continue prescription drug management with lisinopril 5 mg daily with as needed refills -I have personally reviewed and interpreted outside labs performed by patient's PCP which showed serum creatinine 0.59 and potassium  4 on 09/20/2021 -Repeat bmet  3.  Hyperlipidemia -LDL goal less than 70 -I have personally reviewed and interpreted outside labs performed by patient's PCP which showed LDL 111 and HDL 50 on 02/24/2022 -Continue prescription drug management with Crestor 40 mg daily with as needed refills  -he tells me he had stopped exercising for several months due to a broken ankle -he wants to get lipids rechecked which is scheduled in another month and if still elevated can add Zetia  4. OSA - The patient is tolerating PAP therapy well without any problems. The PAP download performed by his DME  was personally reviewed and interpreted by me today and showed an AHI of 0.4 /hr on auto CPAP From 8-14 cm H2O with 100% compliance in using more than 4 hours nightly.  The patient has been using and benefiting from PAP use and will continue to benefit from therapy.      Time:   Today, I have spent 15 minutes with the patient with telehealth technology discussing the above problems.     Medication Adjustments/Labs and Tests Ordered: Current medicines are reviewed at length with the patient today.  Concerns regarding medicines are outlined above.   Tests Ordered: No orders of the defined types were placed in this encounter.   Medication Changes: No orders of the defined types were placed in this encounter.   Follow Up:  after he gets new device  Signed, Fransico Him, MD  05/20/2022 9:21 AM    [place]

## 2022-05-20 NOTE — Patient Instructions (Signed)
Medication Instructions:  Your physician recommends that you continue on your current medications as directed. Please refer to the Current Medication list given to you today.  *If you need a refill on your cardiac medications before your next appointment, please call your pharmacy*   Lab Work: CMET - added to your lab appointment scheduled on 06/23/2022  If you have labs (blood work) drawn today and your tests are completely normal, you will receive your results only by: [place] (if you have MyChart) OR A paper copy in the mail If you have any lab test that is abnormal or we need to change your treatment, we will call you to review the results.   Testing/Procedures: None ordered.    Follow-Up: At [facility], you and your health needs are our priority.  As part of our continuing mission to provide you with exceptional heart care, we have created designated Provider Care Teams.  These Care Teams include your primary Cardiologist (physician) and Advanced Practice Providers (APPs -  Physician Assistants and Nurse Practitioners) who all work together to provide you with the care you need, when you need it.  We recommend signing up for the patient portal called "MyChart".  Sign up information is provided on this After Visit Summary.  MyChart is used to connect with patients for Virtual Visits (Telemedicine).  Patients are able to view lab/test results, encounter notes, upcoming appointments, etc.  Non-urgent messages can be sent to your provider as well.   To learn more about what you can do with MyChart, go to [email or web address].    Your next appointment:   12 months with Dr Radford Pax  Important Information About Sugar

## 2022-05-29 DIAGNOSIS — G4733 Obstructive sleep apnea (adult) (pediatric): Secondary | ICD-10-CM | POA: Diagnosis not present

## 2022-06-06 DIAGNOSIS — G4733 Obstructive sleep apnea (adult) (pediatric): Secondary | ICD-10-CM | POA: Diagnosis not present

## 2022-06-23 ENCOUNTER — Other Ambulatory Visit: Payer: BC Managed Care – PPO

## 2022-06-28 DIAGNOSIS — G4733 Obstructive sleep apnea (adult) (pediatric): Secondary | ICD-10-CM | POA: Diagnosis not present

## 2022-07-06 DIAGNOSIS — G4733 Obstructive sleep apnea (adult) (pediatric): Secondary | ICD-10-CM | POA: Diagnosis not present

## 2022-07-15 ENCOUNTER — Encounter: Payer: Self-pay | Admitting: Cardiology

## 2022-07-29 ENCOUNTER — Other Ambulatory Visit: Payer: BC Managed Care – PPO

## 2022-07-29 DIAGNOSIS — G4733 Obstructive sleep apnea (adult) (pediatric): Secondary | ICD-10-CM | POA: Diagnosis not present

## 2022-08-06 DIAGNOSIS — G4733 Obstructive sleep apnea (adult) (pediatric): Secondary | ICD-10-CM | POA: Diagnosis not present

## 2022-08-29 DIAGNOSIS — G4733 Obstructive sleep apnea (adult) (pediatric): Secondary | ICD-10-CM | POA: Diagnosis not present

## 2022-09-01 ENCOUNTER — Ambulatory Visit: Payer: BC Managed Care – PPO | Attending: Cardiology

## 2022-09-01 DIAGNOSIS — G4733 Obstructive sleep apnea (adult) (pediatric): Secondary | ICD-10-CM | POA: Diagnosis not present

## 2022-09-01 DIAGNOSIS — E785 Hyperlipidemia, unspecified: Secondary | ICD-10-CM | POA: Diagnosis not present

## 2022-09-01 DIAGNOSIS — I10 Essential (primary) hypertension: Secondary | ICD-10-CM | POA: Diagnosis not present

## 2022-09-01 DIAGNOSIS — I2510 Atherosclerotic heart disease of native coronary artery without angina pectoris: Secondary | ICD-10-CM

## 2022-09-01 LAB — COMPREHENSIVE METABOLIC PANEL
ALT: 29 IU/L (ref 0–44)
AST: 26 IU/L (ref 0–40)
Albumin/Globulin Ratio: 2.2 (ref 1.2–2.2)
Albumin: 4.6 g/dL (ref 3.8–4.9)
Alkaline Phosphatase: 48 IU/L (ref 44–121)
BUN/Creatinine Ratio: 17 (ref 9–20)
BUN: 16 mg/dL (ref 6–24)
Bilirubin Total: 0.7 mg/dL (ref 0.0–1.2)
CO2: 26 mmol/L (ref 20–29)
Calcium: 9.5 mg/dL (ref 8.7–10.2)
Chloride: 101 mmol/L (ref 96–106)
Creatinine, Ser: 0.96 mg/dL (ref 0.76–1.27)
Globulin, Total: 2.1 g/dL (ref 1.5–4.5)
Glucose: 118 mg/dL — ABNORMAL HIGH (ref 70–99)
Potassium: 4.5 mmol/L (ref 3.5–5.2)
Sodium: 140 mmol/L (ref 134–144)
Total Protein: 6.7 g/dL (ref 6.0–8.5)
eGFR: 91 mL/min/{1.73_m2} (ref >59)

## 2022-09-01 LAB — LIPID PANEL
Chol/HDL Ratio: 3.3 ratio (ref 0.0–5.0)
Cholesterol, Total: 158 mg/dL (ref 100–199)
HDL: 48 mg/dL (ref >39)
LDL Chol Calc (NIH): 82 mg/dL (ref 0–99)
Triglycerides: 166 mg/dL — ABNORMAL HIGH (ref 0–149)
VLDL Cholesterol Cal: 28 mg/dL (ref 5–40)

## 2022-09-02 ENCOUNTER — Telehealth: Payer: Self-pay

## 2022-09-02 DIAGNOSIS — E785 Hyperlipidemia, unspecified: Secondary | ICD-10-CM

## 2022-09-02 DIAGNOSIS — I2510 Atherosclerotic heart disease of native coronary artery without angina pectoris: Secondary | ICD-10-CM

## 2022-09-02 NOTE — Telephone Encounter (Signed)
The patient has been notified of the result and verbalized understanding.  All questions (if any) were answered. Bernestine Amass, RN 09/02/2022 8:36 AM   Patient reports that he has recently made lifestyle changes with improving his diet and exercising more. He states that he has already lost 15 lbs. He would prefer to continue with these lifestyle changes before adding on any other medications and have his labs rechecked after a few months.

## 2022-09-02 NOTE — Telephone Encounter (Signed)
-----   Message from Sueanne Margarita, MD sent at 09/01/2022  8:28 PM EST ----- LDL still elevated - add Zetia 75m daily and repeat FLP and ALT in 8 weeks

## 2022-09-13 ENCOUNTER — Encounter: Payer: Self-pay | Admitting: Cardiology

## 2022-09-15 MED ORDER — ROSUVASTATIN CALCIUM 40 MG PO TABS: 40.0000 mg | ORAL_TABLET | Freq: Every day | ORAL | 2 refills | Status: DC

## 2022-09-27 DIAGNOSIS — G4733 Obstructive sleep apnea (adult) (pediatric): Secondary | ICD-10-CM | POA: Diagnosis not present

## 2022-10-17 DIAGNOSIS — G4733 Obstructive sleep apnea (adult) (pediatric): Secondary | ICD-10-CM | POA: Diagnosis not present

## 2022-10-27 ENCOUNTER — Encounter: Payer: Self-pay | Admitting: *Deleted

## 2022-10-28 DIAGNOSIS — G4733 Obstructive sleep apnea (adult) (pediatric): Secondary | ICD-10-CM | POA: Diagnosis not present

## 2022-11-16 DIAGNOSIS — G4733 Obstructive sleep apnea (adult) (pediatric): Secondary | ICD-10-CM | POA: Diagnosis not present

## 2022-11-17 ENCOUNTER — Encounter: Payer: Self-pay | Admitting: Cardiology

## 2022-11-17 MED ORDER — LISINOPRIL 5 MG PO TABS: 5.0000 mg | ORAL_TABLET | Freq: Every day | ORAL | 2 refills | Status: DC

## 2022-11-27 DIAGNOSIS — G4733 Obstructive sleep apnea (adult) (pediatric): Secondary | ICD-10-CM | POA: Diagnosis not present

## 2022-12-17 DIAGNOSIS — G4733 Obstructive sleep apnea (adult) (pediatric): Secondary | ICD-10-CM | POA: Diagnosis not present

## 2023-02-06 LAB — LAB REPORT - SCANNED
A1c: 5.8
EGFR: 100

## 2023-02-12 LAB — LAB REPORT - SCANNED: EGFR: 95

## 2023-03-02 ENCOUNTER — Encounter: Payer: Self-pay | Admitting: Cardiology

## 2023-05-18 ENCOUNTER — Telehealth: Payer: Self-pay

## 2023-05-18 ENCOUNTER — Encounter: Payer: Self-pay | Admitting: Cardiology

## 2023-05-18 MED ORDER — ROSUVASTATIN CALCIUM 40 MG PO TABS: 40.0000 mg | ORAL_TABLET | Freq: Every day | ORAL | 0 refills | Status: DC

## 2023-05-18 NOTE — Telephone Encounter (Signed)
Patient requesting crestor refill, has appt 08/20/22.

## 2023-05-27 ENCOUNTER — Ambulatory Visit: Payer: BC Managed Care – PPO | Admitting: Cardiology

## 2023-06-08 ENCOUNTER — Other Ambulatory Visit: Payer: Self-pay | Admitting: Cardiology

## 2023-08-12 ENCOUNTER — Other Ambulatory Visit: Payer: Self-pay | Admitting: Cardiology

## 2023-08-16 ENCOUNTER — Other Ambulatory Visit: Payer: Self-pay | Admitting: Cardiology

## 2023-08-17 ENCOUNTER — Telehealth: Payer: Self-pay | Admitting: Cardiology

## 2023-08-17 ENCOUNTER — Other Ambulatory Visit: Payer: Self-pay

## 2023-08-17 MED ORDER — ROSUVASTATIN CALCIUM 40 MG PO TABS: 40.0000 mg | ORAL_TABLET | Freq: Every day | ORAL | 0 refills | Status: DC

## 2023-08-17 NOTE — Telephone Encounter (Signed)
*  STAT* If patient is at the pharmacy, call can be transferred to refill team.   1. Which medications need to be refilled? (please list name of each medication and dose if known)   rosuvastatin (CRESTOR) 40 MG tablet   2. Would you like to learn more about the convenience, safety, & potential cost savings by using the [facility]?   3. Are you open to using the [facility] (Type [facility]. ).  4. Which pharmacy/location (including street and city if local pharmacy) is medication to be sent to?  [facility] [id number] - [place], [place] - [address]   5. Do they need a 30 day or 90 day supply?   90 day  Patient stated he still has some medication.

## 2023-08-21 ENCOUNTER — Ambulatory Visit: Payer: BC Managed Care – PPO | Admitting: Cardiology

## 2023-09-07 ENCOUNTER — Other Ambulatory Visit: Payer: Self-pay | Admitting: Cardiology

## 2023-10-14 ENCOUNTER — Ambulatory Visit: Payer: Self-pay | Attending: Cardiology | Admitting: Cardiology

## 2023-10-14 ENCOUNTER — Encounter: Payer: Self-pay | Admitting: Cardiology

## 2023-10-14 VITALS — BP 142/82 | HR 61 | Resp 16 | Ht 71.0 in | Wt 282.4 lb

## 2023-10-14 DIAGNOSIS — I10 Essential (primary) hypertension: Secondary | ICD-10-CM | POA: Diagnosis not present

## 2023-10-14 DIAGNOSIS — I2510 Atherosclerotic heart disease of native coronary artery without angina pectoris: Secondary | ICD-10-CM

## 2023-10-14 DIAGNOSIS — G4733 Obstructive sleep apnea (adult) (pediatric): Secondary | ICD-10-CM | POA: Diagnosis not present

## 2023-10-14 DIAGNOSIS — E785 Hyperlipidemia, unspecified: Secondary | ICD-10-CM

## 2023-10-14 NOTE — Patient Instructions (Signed)
 Medication Instructions:  Your physician recommends that you continue on your current medications as directed. Please refer to the Current Medication list given to you today.  *If you need a refill on your cardiac medications before your next appointment, please call your pharmacy*  Lab Work: TODAY: CMET, Lipid panel, hemoglobin A1c If you have labs (blood work) drawn today and your tests are completely normal, you will receive your results only by: MyChart Message (if you have MyChart) OR A paper copy in the mail If you have any lab test that is abnormal or we need to change your treatment, we will call you to review the results.  Follow-Up: At [facility], you and your health needs are our priority.  As part of our continuing mission to provide you with exceptional heart care, our providers are all part of one team.  This team includes your primary Cardiologist (physician) and Advanced Practice Providers or APPs (Physician Assistants and Nurse Practitioners) who all work together to provide you with the care you need, when you need it.  Your next appointment:   1 year(s)  Provider:   Armanda Magic, MD   We recommend signing up for the patient portal called "MyChart".  Sign up information is provided on this After Visit Summary.  MyChart is used to connect with patients for Virtual Visits (Telemedicine).  Patients are able to view lab/test results, encounter notes, upcoming appointments, etc.  Non-urgent messages can be sent to your provider as well.   To learn more about what you can do with MyChart, go to [email or web address].   Other Instructions      1st Floor: - Lobby - Registration  - Pharmacy  - Lab - Cafe  2nd Floor: - PV Lab - Diagnostic Testing (echo, CT, nuclear med)  3rd Floor: - Vacant  4th Floor: - TCTS (cardiothoracic surgery) - AFib Clinic - Structural Heart Clinic - Vascular Surgery  - Vascular Ultrasound  5th Floor: - HeartCare  Cardiology (general and EP) - Clinical Pharmacy for coumadin, hypertension, lipid, weight-loss medications, and med management appointments    Valet parking services will be available as well.

## 2023-10-14 NOTE — Progress Notes (Signed)
 Date:  10/14/2023   ID:  Peter Oconnor, DOB 01/18/1961, MRN [id number] The patient was identified using 2 identifiers.  PCP:  No primary care provider on file.   [facility] Providers Cardiologist:  Armanda Magic, MD     Evaluation Performed:  Follow-Up VisitHe  Chief Complaint:  CAD  History of Present Illness:    Peter Oconnor is a 63 y.o. male with with history of arthritis, tobacco abuse, HTN, CAD, hyperlipidemia and sleep apnea He has an anterior MI in July 201 in [place]. Cardiac cath 2015 with mild RCA stenosis, severe LAD stenosis and occluded Circumflex. He had stenting of his LAD and Circumflex at that time. Echo in 2015 with LVEF=60-65%. He was seen in cardiology at [facility] in July 2021. Beta blocker stopped due to erectile dysfunction.  He was lost to follow-up but reestablished care.    He is here today for followup and is doing well.  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  He denies any chest pain or pressure, SOB, DOE, PND, orthopnea, LE edema, dizziness, palpitations or syncope. He is compliant with his meds and is tolerating meds with no SE.    He is doing well with his PAP device and thinks that he has gotten used to it.  He tolerates the full face mask and feels the pressure is adequate.  Since going on PAP he feels rested in the am and has no significant daytime sleepiness.  He denies any significant mouth or nasal dryness or nasal congestion.  He does not think that he snores.    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     CHF (congestive heart failure) (HCC)    Patient denies    Chicken pox    Hyperlipidemia with target LDL less than 70 10/19/2015   MI, old    Obesity (BMI 30-39.9) 10/19/2015   OSA (obstructive sleep apnea) 10/19/2015   Sleep apnea    Past Surgical History:  Procedure Laterality Date   CARDIAC CATHETERIZATION     COLONOSCOPY     CORONARY ANGIOPLASTY  01/25/2014   PCI of LCx and LAD   KNEE SURGERY     NASAL SINUS SURGERY     ORIF ANKLE FRACTURE Right 09/20/2021   Procedure: OPEN REDUCTION INTERNAL FIXATION (ORIF) ANKLE FRACTURE;  Surgeon: Frederico Hamman, MD;  Location: [facility];  Service: Orthopedics;  Laterality: Right;     Current Meds  Medication Sig   aspirin EC 81 MG tablet Take 81 mg by mouth daily. Swallow whole.   lisinopril (ZESTRIL) 5 MG tablet TAKE 1 TABLET BY MOUTH ONCE DAILY. **PLEASE KEEP SCHEDULED APPOINTMENT FOR FUTURE REFILLS   rosuvastatin (CRESTOR) 40 MG tablet Take 1 tablet (40 mg total) by mouth daily.   Current Facility-Administered Medications for the 10/14/23 encounter (Office Visit) with Quintella Reichert, MD  Medication   0.9 %  sodium chloride infusion     Allergies:   Penicillins   Social History   Tobacco Use   Smoking status: Former    Current packs/day:  0.00    Types: Cigarettes    Quit date: 1990    Years since quitting: 35.2   Smokeless tobacco: Never  Vaping Use   Vaping status: Never Used  Substance Use Topics   Alcohol use: Yes    Comment: occasionally   Drug use: No     Family Hx: The patient's family history includes Arthritis in his father; Heart attack in his maternal grandfather; Heart disease in his maternal grandfather; Hyperlipidemia in his maternal grandfather, maternal grandmother, and mother; Leukemia in his father; Stroke in his maternal grandfather. There is no history of Colon cancer, Liver cancer, Stomach cancer, Rectal cancer, or Esophageal cancer.  ROS:   Please see the history of present illness.     All other systems reviewed and are negative.   Prior CV studies:   The  following studies were reviewed today:  PAP compliance download  Labs/Other Tests and Data Reviewed:    EKG Interpretation Date/Time:  Wednesday October 14 2023 09:07:04 EDT Ventricular Rate:  61 PR Interval:  150 QRS Duration:  86 QT Interval:  412 QTC Calculation: 414 R Axis:   13  Text Interpretation: Normal sinus rhythm Nonspecific T wave abnormality No previous ECGs available Confirmed by Armanda Magic ([id number]) on 10/14/2023 9:31:49 AM    Recent Labs: No results found for requested labs within last 365 days.   Recent Lipid Panel Lab Results  Component Value Date/Time   CHOL 158 09/01/2022 07:45 AM   TRIG 166 (H) 09/01/2022 07:45 AM   HDL 48 09/01/2022 07:45 AM   CHOLHDL 3.3 09/01/2022 07:45 AM   CHOLHDL 3.1 10/25/2015 07:31 AM   LDLCALC 82 09/01/2022 07:45 AM    Wt Readings from Last 3 Encounters:  10/14/23 282 lb 6.4 oz (128.1 kg)  05/20/22 280 lb (127 kg)  02/19/22 280 lb (127 kg)     Risk Assessment/Calculations:          Objective:   VS: BP 142/67mmHg HR 61bpm O2 sat 98% on RA GEN: Well nourished, well developed in no acute distress HEENT: Normal NECK: No JVD; No carotid bruits LYMPHATICS: No lymphadenopathy CARDIAC:RRR, no murmurs, rubs, gallops RESPIRATORY:  Clear to auscultation without rales, wheezing or rhonchi  ABDOMEN: Soft, non-tender, non-distended MUSCULOSKELETAL:  No edema; No deformity  SKIN: Warm and dry NEUROLOGIC:  Alert and oriented x 3 PSYCHIATRIC:  Normal affect  ASSESSMENT & PLAN:    ASCAD -s/p anterior MI in July 2015 in [place]. Cardiac cath 2015 with mild RCA stenosis, severe LAD stenosis and occluded Circumflex. He had stenting of his LAD and Circumflex at that time. -He has not had any anginal symptoms since I saw him last -Continue prescription drug management with aspirin 81 mg daily Crestor 40 mg daily with as needed refills  Hypertension -BP borderline controlled on exam today -continue prescription drug management with  Lisinopril 5mg  daily but was stressed out this am prior to getting into the room -Check c-Met today  Hyperlipidemia -LDL goal less than 70 -Check C-met and fasting lipid panel as well as HbA1C  today -Continue prescription drug management with Crestor 40 mg daily with as needed refills-  OSA -The patient is tolerating PAP therapy well without any problems. The PAP download performed by his DME was personally reviewed and interpreted by me today and showed an AHI of 0.6 /hr on auto CPAP from 8-14 cm H2O with 100% compliance in using more than 4 hours nightly.  The patient has been using and benefiting from  PAP use and will continue to benefit from therapy.     Medication Adjustments/Labs and Tests Ordered: Current medicines are reviewed at length with the patient today.  Concerns regarding medicines are outlined above.   Tests Ordered: Orders Placed This Encounter  Procedures   EKG 12-Lead    Medication Changes: No orders of the defined types were placed in this encounter.   Follow Up:  after he gets new device  Signed, Armanda Magic, MD  10/14/2023 9:29 AM    [facility]

## 2023-10-14 NOTE — Addendum Note (Signed)
 Addended by: Franchot Gallo on: 10/14/2023 09:35 AM   Modules accepted: Orders

## 2023-10-15 ENCOUNTER — Telehealth: Payer: Self-pay

## 2023-10-15 DIAGNOSIS — Z79899 Other long term (current) drug therapy: Secondary | ICD-10-CM

## 2023-10-15 DIAGNOSIS — E785 Hyperlipidemia, unspecified: Secondary | ICD-10-CM

## 2023-10-15 NOTE — Telephone Encounter (Signed)
 Spoke with pt regarding his lab results. Pt is hesitant to start the Zetia as he believes his LDL was high due to being on vacation and not eating well and exercising prior to the lab drawn. Pt also stated he had been sick a couple weeks prior to the lab draw. Pt stated he has been able to lower his LDL with diet and exercise before and would like to try again before adding a medication. Pt was told this information would be forwarded to Dr. Mayford Knife. No labs were ordered at this time. Pt verbalized understanding. All questions, if any, were answered.

## 2023-10-15 NOTE — Addendum Note (Signed)
 Addended by: Erick Alley on: 10/15/2023 12:03 PM   Modules accepted: Orders

## 2023-10-15 NOTE — Telephone Encounter (Signed)
 Spoke to pt and notified him of Dr. Norris Cross request for labs in 3 months. Pt verbalized understanding. All questions, if any, were answered. An FLP and ALT were ordered and released for 3 months from today. Pt plans to go to [facility] 01/13/24.

## 2023-10-15 NOTE — Telephone Encounter (Signed)
-----   Message from Armanda Magic sent at 10/15/2023  9:32 AM EDT ----- LDL too high - add Zetia 10mg  daily and repeat FLP and ALT in 8 weeks.  Other labs are ok

## 2023-10-17 LAB — LIPID PANEL
Chol/HDL Ratio: 2.9 ratio (ref 0.0–5.0)
Cholesterol, Total: 172 mg/dL (ref 100–199)
HDL: 59 mg/dL (ref >39)
LDL Chol Calc (NIH): 96 mg/dL (ref 0–99)
Triglycerides: 91 mg/dL (ref 0–149)
VLDL Cholesterol Cal: 17 mg/dL (ref 5–40)

## 2023-10-17 LAB — COMPREHENSIVE METABOLIC PANEL WITH GFR
ALT: 22 IU/L (ref 0–44)
AST: 25 IU/L (ref 0–40)
Albumin: 4.6 g/dL (ref 3.8–4.9)
Alkaline Phosphatase: 57 IU/L (ref 44–121)
BUN/Creatinine Ratio: 16 (ref 10–24)
BUN: 15 mg/dL (ref 8–27)
Bilirubin Total: 0.5 mg/dL (ref 0.0–1.2)
CO2: 24 mmol/L (ref 20–29)
Calcium: 10 mg/dL (ref 8.6–10.2)
Chloride: 104 mmol/L (ref 96–106)
Creatinine, Ser: 0.95 mg/dL (ref 0.76–1.27)
Globulin, Total: 2.4 g/dL (ref 1.5–4.5)
Glucose: 111 mg/dL — ABNORMAL HIGH (ref 70–99)
Potassium: 5.2 mmol/L (ref 3.5–5.2)
Sodium: 143 mmol/L (ref 134–144)
Total Protein: 7 g/dL (ref 6.0–8.5)
eGFR: 92 mL/min/{1.73_m2} (ref >59)

## 2023-10-17 LAB — HEMOGLOBIN A1C
Est. average glucose Bld gHb Est-mCnc: 128 mg/dL
Hgb A1c MFr Bld: 6.1 % — ABNORMAL HIGH (ref 4.8–5.6)

## 2023-11-06 ENCOUNTER — Other Ambulatory Visit: Payer: Self-pay | Admitting: Cardiology

## 2023-11-09 MED ORDER — LISINOPRIL 5 MG PO TABS: 5.0000 mg | ORAL_TABLET | Freq: Every day | ORAL | 3 refills | Status: AC

## 2023-11-09 NOTE — Addendum Note (Signed)
 Addended by: Jakarius Flamenco on: 11/09/2023 10:47 AM   Modules accepted: Orders

## 2023-11-11 ENCOUNTER — Other Ambulatory Visit: Payer: Self-pay | Admitting: Cardiology

## 2024-01-21 ENCOUNTER — Telehealth: Payer: Self-pay

## 2024-01-21 ENCOUNTER — Ambulatory Visit (HOSPITAL_COMMUNITY): Payer: Self-pay | Admitting: Emergency Medicine

## 2024-01-21 ENCOUNTER — Encounter (HOSPITAL_COMMUNITY): Payer: Self-pay

## 2024-01-21 ENCOUNTER — Ambulatory Visit (HOSPITAL_COMMUNITY)
Admission: RE | Admit: 2024-01-21 | Discharge: 2024-01-21 | Disposition: A | Discharge status: Home / Self Care | Source: Ambulatory Visit | Attending: Emergency Medicine | Admitting: Emergency Medicine

## 2024-01-21 ENCOUNTER — Ambulatory Visit: Payer: Self-pay

## 2024-01-21 VITALS — BP 157/91 | HR 65 | Temp 98.2°F | Resp 18

## 2024-01-21 DIAGNOSIS — R3989 Other symptoms and signs involving the genitourinary system: Secondary | ICD-10-CM | POA: Diagnosis present

## 2024-01-21 DIAGNOSIS — R319 Hematuria, unspecified: Secondary | ICD-10-CM | POA: Diagnosis present

## 2024-01-21 LAB — COMPREHENSIVE METABOLIC PANEL WITH GFR
ALT: 30 U/L (ref 0–44)
AST: 31 U/L (ref 15–41)
Albumin: 4.6 g/dL (ref 3.5–5.0)
Alkaline Phosphatase: 42 U/L (ref 38–126)
Anion gap: 9 (ref 5–15)
BUN: 18 mg/dL (ref 6–20)
CO2: 25 mmol/L (ref 22–32)
Calcium: 9.9 mg/dL (ref 8.9–10.3)
Chloride: 101 mmol/L (ref 98–111)
Creatinine, Ser: 0.91 mg/dL (ref 0.61–1.24)
GFR, Estimated: >60 mL/min (ref >60)
Glucose, Bld: 109 mg/dL — ABNORMAL HIGH (ref 70–99)
Potassium: 4.8 mmol/L (ref 3.5–5.1)
Sodium: 135 mmol/L (ref 135–145)
Total Bilirubin: 1 mg/dL (ref 0.0–1.2)
Total Protein: 7.8 g/dL (ref 6.5–8.1)

## 2024-01-21 LAB — POCT URINALYSIS DIP (MANUAL ENTRY)
Bilirubin, UA: NEGATIVE
Glucose, UA: NEGATIVE mg/dL
Ketones, POC UA: NEGATIVE mg/dL
Leukocytes, UA: NEGATIVE
Nitrite, UA: NEGATIVE
Protein Ur, POC: 100 mg/dL — AB
Spec Grav, UA: 1.01 (ref 1.010–1.025)
Urobilinogen, UA: 0.2 U/dL
pH, UA: 6 (ref 5.0–8.0)

## 2024-01-21 LAB — CBC WITH DIFFERENTIAL/PLATELET
Abs Immature Granulocytes: 0.04 K/uL (ref 0.00–0.07)
Basophils Absolute: 0.1 K/uL (ref 0.0–0.1)
Basophils Relative: 1 %
Eosinophils Absolute: 0.1 K/uL (ref 0.0–0.5)
Eosinophils Relative: 1 %
HCT: 44 % (ref 39.0–52.0)
Hemoglobin: 14.9 g/dL (ref 13.0–17.0)
Immature Granulocytes: 1 %
Lymphocytes Relative: 25 %
Lymphs Abs: 1.9 K/uL (ref 0.7–4.0)
MCH: 31.4 pg (ref 26.0–34.0)
MCHC: 33.9 g/dL (ref 30.0–36.0)
MCV: 92.6 fL (ref 80.0–100.0)
Monocytes Absolute: 0.7 K/uL (ref 0.1–1.0)
Monocytes Relative: 9 %
Neutro Abs: 4.8 K/uL (ref 1.7–7.7)
Neutrophils Relative %: 63 %
Platelets: 179 K/uL (ref 150–400)
RBC: 4.75 MIL/uL (ref 4.22–5.81)
RDW: 13.4 % (ref 11.5–15.5)
WBC: 7.6 K/uL (ref 4.0–10.5)
nRBC: 0 % (ref 0.0–0.2)

## 2024-01-21 NOTE — Telephone Encounter (Signed)
 Called and spoke with patient regarding lab work.  Informed patient that all labs were within normal limits and there is no concern for acute kidney injury, renal failure, or anemia at this time related to his symptoms.  Recommended patient follow-up with his primary care provider for further evaluation of this.  Reminded patient of strict ER precautions.  Urine culture is still pending at this time.

## 2024-01-21 NOTE — Telephone Encounter (Signed)
   Copied from CRM [id number]. Topic: Clinical - Red Word Triage >> Jan 21, 2024 12:14 PM [place] A wrote: [facility] that prompted transfer to Nurse Triage: Patient is having dried blood particles he blood particles when he urinates noticed early part of the week Reason for Disposition  All other urine symptoms  Answer Assessment - Initial Assessment Questions 1. SYMPTOM: What's the main symptom you're concerned about? (e.g., frequency, incontinence)     Discolored urine, see's sediment. Reports 3 times of dried blood specks.  2. ONSET: When did the  discolored urine  start?     One week 3. PAIN: Is there any pain? If Yes, ask: How bad is it? (Scale: 1-10; mild, moderate, severe)     denies 4. CAUSE: What do you think is causing the symptoms?     Unsure 5. OTHER SYMPTOMS: Do you have any other symptoms? (e.g., blood in urine, fever, flank pain, pain with urination)     Blood  Protocols used: Urinary Symptoms-A-AH

## 2024-01-21 NOTE — ED Triage Notes (Signed)
 Pt states that for the last two days his urine has been discoloration, dark than normal. He states he drink 120 ounces of water a day. He states he also sees particles of dried blood in his urine. He denies any pain

## 2024-01-21 NOTE — Telephone Encounter (Signed)
 Copied from CRM [id number]. Topic: Appointments - Scheduling Inquiry for Clinic >> Jan 21, 2024 11:55 AM Chiquita SQUIBB wrote: Reason for CRM: Patient is calling in to schedule with Dr. Berneta, he used to be a patient in 2019 and has since moved which was his reason for not being seen in so long. Patient is asking if Dr. Berneta would be willing to see him again since he used to patient. Please advise the patient if he can or schedule with someone else.

## 2024-01-21 NOTE — ED Provider Notes (Signed)
 [facility]    CSN: [id number] Arrival date & time: 01/21/24  1247      History   Chief Complaint Chief Complaint  Patient presents with   Urinary Frequency    Entered by patient    HPI Keyonte Cookston is a 63 y.o. male.   Patient presents with dark urine for about a week.  Patient states that a few times he has also noticed what appeared to be blood particles in his urine.  Patient states that there were 3 times when he urinated that he noticed these blood particles.  Patient states that the blood crackles do not occur every time, but the urine continues to appear darker than normal.  Denies dysuria, urinary frequency/urgency, penile discharge, penile/testicular pain or swelling, fever, weakness, abdominal pain, and flank pain.  Patient denies any recent falls or injuries.  Patient states that he has been drinking more water than usual since his urine became dark and at times it does seem to become lighter, but then becomes darker again.  Patient denies history of kidney disease.  Patient also denies history of kidney stones.  The history is provided by the patient and medical records.  Urinary Frequency    Past Medical History:  Diagnosis Date   Allergy    Arthritis    CAD (coronary artery disease), native coronary artery 01/25/2014   S/p anterior MI with 90% LAD and occluded LCx s/p PCI of LAD and LCx with residual 30-40% stenosis of the RCA and normal LVF with lateral AK.     CHF (congestive heart failure) (HCC)    Patient denies   Chicken pox    Hyperlipidemia with target LDL less than 70 10/19/2015   MI, old    Obesity (BMI 30-39.9) 10/19/2015   OSA (obstructive sleep apnea) 10/19/2015   Sleep apnea     Patient Active Problem List   Diagnosis Date Noted   Dislocation of right shoulder joint 03/14/2019   Encounter for removal of sutures 01/22/2018   Light headed 01/22/2018   Left knee pain 09/28/2017   Effusion of knee joint, left 09/28/2017    Primary osteoarthritis of both knees 09/28/2017   Hyperlipidemia with target LDL less than 70 10/19/2015   Benign essential HTN 10/19/2015   OSA (obstructive sleep apnea) 10/19/2015   Obesity (BMI 30-39.9) 10/19/2015   CAD (coronary artery disease), native coronary artery 01/25/2014    Past Surgical History:  Procedure Laterality Date   CARDIAC CATHETERIZATION     COLONOSCOPY     CORONARY ANGIOPLASTY  01/25/2014   PCI of LCx and LAD   KNEE SURGERY     NASAL SINUS SURGERY     ORIF ANKLE FRACTURE Right 09/20/2021   Procedure: OPEN REDUCTION INTERNAL FIXATION (ORIF) ANKLE FRACTURE;  Surgeon: Shari Sieving, MD;  Location: [facility];  Service: Orthopedics;  Laterality: Right;       Home Medications    Prior to Admission medications   Medication Sig Start Date End Date Taking? Authorizing Provider  aspirin  EC 81 MG tablet Take 81 mg by mouth daily. Swallow whole.   Yes [provider]  lisinopril  (ZESTRIL ) 5 MG tablet Take 1 tablet (5 mg total) by mouth daily. 11/09/23  Yes Turner, Wilbert SAUNDERS, MD  rosuvastatin  (CRESTOR ) 40 MG tablet TAKE 1 TABLET BY MOUTH DAILY 11/12/23  Yes Turner, Wilbert SAUNDERS, MD    Family History Family History  Problem Relation Age of Onset   Hyperlipidemia Mother    Leukemia Father  Arthritis Father    Heart attack Maternal Grandfather    Heart disease Maternal Grandfather    Hyperlipidemia Maternal Grandfather    Stroke Maternal Grandfather    Hyperlipidemia Maternal Grandmother    Colon cancer Neg Hx    Liver cancer Neg Hx    Stomach cancer Neg Hx    Rectal cancer Neg Hx    Esophageal cancer Neg Hx     Social History Social History   Tobacco Use   Smoking status: Former    Current packs/day: 0.00    Types: Cigarettes    Quit date: 1990    Years since quitting: 35.5   Smokeless tobacco: Never  Vaping Use   Vaping status: Never Used  Substance Use Topics   Alcohol use: Yes    Comment: occasionally   Drug use: No     Allergies    Penicillins   Review of Systems Review of Systems  Genitourinary:  Positive for frequency.   Per HPI  Physical Exam Triage Vital Signs ED Triage Vitals  Encounter Vitals Group     BP 01/21/24 1300 (!) 157/91     Girls Systolic BP Percentile --      Girls Diastolic BP Percentile --      Boys Systolic BP Percentile --      Boys Diastolic BP Percentile --      Pulse Rate 01/21/24 1300 65     Resp 01/21/24 1300 18     Temp 01/21/24 1300 98.2 F (36.8 C)     Temp Source 01/21/24 1300 Oral     SpO2 01/21/24 1300 95 %     Weight --      Height --      Head Circumference --      Peak Flow --      Pain Score 01/21/24 1259 0     Pain Loc --      Pain Education --      Exclude from Growth Chart --    No data found.  Updated Vital Signs BP (!) 157/91 (BP Location: Left Arm)   Pulse 65   Temp 98.2 F (36.8 C) (Oral)   Resp 18   SpO2 95%   Visual Acuity Right Eye Distance:   Left Eye Distance:   Bilateral Distance:    Right Eye Near:   Left Eye Near:    Bilateral Near:     Physical Exam Vitals and nursing note reviewed.  Constitutional:      General: He is awake. He is not in acute distress.    Appearance: Normal appearance. He is well-developed and well-groomed. He is not ill-appearing.  Cardiovascular:     Rate and Rhythm: Normal rate and regular rhythm.  Pulmonary:     Effort: Pulmonary effort is normal.     Breath sounds: Normal breath sounds.  Abdominal:     General: There is no distension.     Palpations: Abdomen is soft.     Tenderness: There is no abdominal tenderness. There is no right CVA tenderness, left CVA tenderness, guarding or rebound.  Skin:    General: Skin is warm and dry.  Neurological:     Mental Status: He is alert.  Psychiatric:        Behavior: Behavior is cooperative.      UC Treatments / Results  Labs (all labs ordered are listed, but only abnormal results are displayed) Labs Reviewed  POCT URINALYSIS DIP (MANUAL ENTRY) -  Abnormal; Notable for the  following components:      Result Value   Color, UA red (*)    Clarity, UA cloudy (*)    Blood, UA large (*)    Protein Ur, POC =100 (*)    All other components within normal limits  URINE CULTURE  CBC WITH DIFFERENTIAL/PLATELET  COMPREHENSIVE METABOLIC PANEL WITH GFR    EKG   Radiology No results found.  Procedures Procedures (including critical care time)  Medications Ordered in UC Medications - No data to display  Initial Impression / Assessment and Plan / UC Course  I have reviewed the triage vital signs and the nursing notes.  Pertinent labs & imaging results that were available during my care of the patient were reviewed by me and considered in my medical decision making (see chart for details).     Patient is overall well-appearing.  Vitals are stable.  No significant findings upon exam.  Nontender to abdomen and without CVA tenderness.  Large RBCs found in urinalysis with some protein as well.  Will send urine culture for further evaluation of this.  Ordered CBC and CMP to evaluate blood counts related to hematuria and to evaluate renal function.  Discussed follow-up, return, and strict ER precautions. Final Clinical Impressions(s) / UC Diagnoses   Final diagnoses:  Hematuria, unspecified type  Urine discoloration     Discharge Instructions      We have drawn some lab work today to evaluate your blood counts and kidney function.  If these results are concerning you will receive a phone call advising any additional treatment recommendations. We have also sent a urine culture and that will return over the next few days and if results are positive or require any treatment related to this someone will call and let you know. Otherwise recommend following up with your primary care provider or return here as needed. If you develop significant amount of blood in your urine, severe abdominal pain, severe back pain, fever, dizziness, or passing  out please seek immediate medical treatment in the emergency department   ED Prescriptions   None    PDMP not reviewed this encounter.   Johnie Flaming A, NP 01/21/24 1402

## 2024-01-21 NOTE — Discharge Instructions (Signed)
 We have drawn some lab work today to evaluate your blood counts and kidney function.  If these results are concerning you will receive a phone call advising any additional treatment recommendations. We have also sent a urine culture and that will return over the next few days and if results are positive or require any treatment related to this someone will call and let you know. Otherwise recommend following up with your primary care provider or return here as needed. If you develop significant amount of blood in your urine, severe abdominal pain, severe back pain, fever, dizziness, or passing out please seek immediate medical treatment in the emergency department

## 2024-01-21 NOTE — Telephone Encounter (Signed)
 Just passing message along, he does have NP appt with Dr Sebastian on 03/02/24.

## 2024-01-22 LAB — URINE CULTURE: Culture: NO GROWTH

## 2024-01-29 ENCOUNTER — Ambulatory Visit (HOSPITAL_COMMUNITY)

## 2024-02-02 ENCOUNTER — Ambulatory Visit (HOSPITAL_COMMUNITY)

## 2024-02-03 NOTE — Progress Notes (Signed)
 Chief Complaint: Blood in urine  History of Present Illness: This 63 year old man is here for evaluation and management of gross hematuria.  Starting about 6 weeks ago, he has had intermittent gross painless hematuria.  Sometimes small clots were produced.  He has no associated flank or abdominal pain.  No environmental exposure issues except for history of smoking for short period of time years ago.  No prior urologic history.  No history of kidney stones.  He did go to the [facility] health urgent care center on the 10th of this month.  At that time urinalysis revealed multiple red cells, no evidence of infection.  Creatinine was 0.91, hemoglobin 14.9.   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     CHF (congestive heart failure) (HCC)    Patient denies   Chicken pox    Hyperlipidemia with target LDL less than 70 10/19/2015   MI, old    Obesity (BMI 30-39.9) 10/19/2015   OSA (obstructive sleep apnea) 10/19/2015   Sleep apnea     Past Surgical History:  Past Surgical History:  Procedure Laterality Date   CARDIAC CATHETERIZATION     COLONOSCOPY     CORONARY ANGIOPLASTY  01/25/2014   PCI of LCx and LAD   KNEE SURGERY     NASAL SINUS SURGERY     ORIF ANKLE FRACTURE Right 09/20/2021   Procedure: OPEN REDUCTION INTERNAL FIXATION (ORIF) ANKLE FRACTURE;  Surgeon: Shari Sieving, MD;  Location: [facility];  Service: Orthopedics;  Laterality: Right;    Allergies:  Allergies  Allergen Reactions   Penicillins Itching and Rash    Family History:  Family History  Problem Relation Age of Onset   Hyperlipidemia Mother    Leukemia Father    Arthritis Father    Heart attack Maternal Grandfather    Heart disease Maternal Grandfather     Hyperlipidemia Maternal Grandfather    Stroke Maternal Grandfather    Hyperlipidemia Maternal Grandmother    Colon cancer Neg Hx    Liver cancer Neg Hx    Stomach cancer Neg Hx    Rectal cancer Neg Hx    Esophageal cancer Neg Hx     Social History:  Social History   Tobacco Use   Smoking status: Former    Current packs/day: 0.00    Types: Cigarettes    Quit date: 1990    Years since quitting: 35.5   Smokeless tobacco: Never  Vaping Use   Vaping status: Never Used  Substance Use Topics   Alcohol use: Yes    Comment: occasionally   Drug use: No    Review of symptoms:  Constitutional:  Negative for unexplained weight loss, night sweats, fever, chills ENT:  Negative for nose bleeds, sinus pain, painful swallowing CV:  Negative for chest pain, shortness of breath, exercise intolerance, palpitations, loss of consciousness Resp:  Negative for cough, wheezing, shortness of breath GI:  Negative for nausea, vomiting, diarrhea, bloody stools GU:  Positives noted in HPI; otherwise negative for gross hematuria, dysuria, urinary incontinence Neuro:  Negative for seizures, poor balance, limb weakness, slurred speech Psych:  Negative for lack of energy, depression, anxiety Endocrine:  Negative for polydipsia, polyuria, symptoms of  hypoglycemia (dizziness, hunger, sweating) Hematologic:  Negative for anemia, purpura, petechia, prolonged or excessive bleeding, use of anticoagulants  Allergic:  Negative for difficulty breathing or choking as a result of exposure to anything; no shellfish allergy; no allergic response (rash/itch) to materials, foods  Physical exam: There were no vitals taken for this visit. GENERAL APPEARANCE:  Well appearing, well developed, well nourished, NAD HEENT: Atraumatic, Normocephalic. NECK: Normal appearance LUNGS: Normal inspiratory and expiratory excursion HEART: Regular Rate ABDOMEN: Obese.  There are no inguinal hernias. GU: Phallus normal, no lesions.  Scrotal skin normal. Testicles/epididymal structures normal. Meatus normal.  EXTREMITIES: Moves all extremities well.  Without clubbing, cyanosis, or edema. NEUROLOGIC:  Alert and oriented x 3, normal gait, CN II-XII grossly intact.  MENTAL STATUS:  Appropriate. SKIN:  Warm, dry and intact.    Results:  I have reviewed referring/prior physicians notes  I have reviewed urinalysis--microscopic hematuria noted today  I have reviewed PSA results--1.5 in 2019  I have reviewed urine culture result from urgent care visit-negativ  Assessment: Persistent gross painless hematuria   Plan: I discussed possible etiologies with him  We will proceed with hematuria CT as well as visit here next available for cystoscopy

## 2024-02-03 NOTE — H&P (View-Only) (Signed)
 Chief Complaint: Blood in urine  History of Present Illness: This 63 year old man is here for evaluation and management of gross hematuria.  Starting about 6 weeks ago, he has had intermittent gross painless hematuria.  Sometimes small clots were produced.  He has no associated flank or abdominal pain.  No environmental exposure issues except for history of smoking for short period of time years ago.  No prior urologic history.  No history of kidney stones.  He did go to the [facility] health urgent care center on the 10th of this month.  At that time urinalysis revealed multiple red cells, no evidence of infection.  Creatinine was 0.91, hemoglobin 14.9.   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     CHF (congestive heart failure) (HCC)    Patient denies   Chicken pox    Hyperlipidemia with target LDL less than 70 10/19/2015   MI, old    Obesity (BMI 30-39.9) 10/19/2015   OSA (obstructive sleep apnea) 10/19/2015   Sleep apnea     Past Surgical History:  Past Surgical History:  Procedure Laterality Date   CARDIAC CATHETERIZATION     COLONOSCOPY     CORONARY ANGIOPLASTY  01/25/2014   PCI of LCx and LAD   KNEE SURGERY     NASAL SINUS SURGERY     ORIF ANKLE FRACTURE Right 09/20/2021   Procedure: OPEN REDUCTION INTERNAL FIXATION (ORIF) ANKLE FRACTURE;  Surgeon: Shari Sieving, MD;  Location: [facility];  Service: Orthopedics;  Laterality: Right;    Allergies:  Allergies  Allergen Reactions   Penicillins Itching and Rash    Family History:  Family History  Problem Relation Age of Onset   Hyperlipidemia Mother    Leukemia Father    Arthritis Father    Heart attack Maternal Grandfather    Heart disease Maternal Grandfather     Hyperlipidemia Maternal Grandfather    Stroke Maternal Grandfather    Hyperlipidemia Maternal Grandmother    Colon cancer Neg Hx    Liver cancer Neg Hx    Stomach cancer Neg Hx    Rectal cancer Neg Hx    Esophageal cancer Neg Hx     Social History:  Social History   Tobacco Use   Smoking status: Former    Current packs/day: 0.00    Types: Cigarettes    Quit date: 1990    Years since quitting: 35.5   Smokeless tobacco: Never  Vaping Use   Vaping status: Never Used  Substance Use Topics   Alcohol use: Yes    Comment: occasionally   Drug use: No    Review of symptoms:  Constitutional:  Negative for unexplained weight loss, night sweats, fever, chills ENT:  Negative for nose bleeds, sinus pain, painful swallowing CV:  Negative for chest pain, shortness of breath, exercise intolerance, palpitations, loss of consciousness Resp:  Negative for cough, wheezing, shortness of breath GI:  Negative for nausea, vomiting, diarrhea, bloody stools GU:  Positives noted in HPI; otherwise negative for gross hematuria, dysuria, urinary incontinence Neuro:  Negative for seizures, poor balance, limb weakness, slurred speech Psych:  Negative for lack of energy, depression, anxiety Endocrine:  Negative for polydipsia, polyuria, symptoms of  hypoglycemia (dizziness, hunger, sweating) Hematologic:  Negative for anemia, purpura, petechia, prolonged or excessive bleeding, use of anticoagulants  Allergic:  Negative for difficulty breathing or choking as a result of exposure to anything; no shellfish allergy; no allergic response (rash/itch) to materials, foods  Physical exam: There were no vitals taken for this visit. GENERAL APPEARANCE:  Well appearing, well developed, well nourished, NAD HEENT: Atraumatic, Normocephalic. NECK: Normal appearance LUNGS: Normal inspiratory and expiratory excursion HEART: Regular Rate ABDOMEN: Obese.  There are no inguinal hernias. GU: Phallus normal, no lesions.  Scrotal skin normal. Testicles/epididymal structures normal. Meatus normal.  EXTREMITIES: Moves all extremities well.  Without clubbing, cyanosis, or edema. NEUROLOGIC:  Alert and oriented x 3, normal gait, CN II-XII grossly intact.  MENTAL STATUS:  Appropriate. SKIN:  Warm, dry and intact.    Results:  I have reviewed referring/prior physicians notes  I have reviewed urinalysis--microscopic hematuria noted today  I have reviewed PSA results--1.5 in 2019  I have reviewed urine culture result from urgent care visit-negativ  Assessment: Persistent gross painless hematuria   Plan: I discussed possible etiologies with him  We will proceed with hematuria CT as well as visit here next available for cystoscopy

## 2024-02-08 ENCOUNTER — Encounter: Payer: Self-pay | Admitting: Urology

## 2024-02-08 ENCOUNTER — Ambulatory Visit (INDEPENDENT_AMBULATORY_CARE_PROVIDER_SITE_OTHER): Admitting: Urology

## 2024-02-08 VITALS — BP 175/96 | HR 72 | Ht 71.0 in | Wt 275.0 lb

## 2024-02-08 DIAGNOSIS — R310 Gross hematuria: Secondary | ICD-10-CM | POA: Diagnosis not present

## 2024-02-08 LAB — URINALYSIS, ROUTINE W REFLEX MICROSCOPIC
Bilirubin, UA: NEGATIVE
Glucose, UA: NEGATIVE
Ketones, UA: NEGATIVE
Leukocytes,UA: NEGATIVE
Nitrite, UA: NEGATIVE
Specific Gravity, UA: 1.01 (ref 1.005–1.030)
Urobilinogen, Ur: 0.2 mg/dL (ref 0.2–1.0)
pH, UA: 6 (ref 5.0–7.5)

## 2024-02-08 LAB — MICROSCOPIC EXAMINATION: RBC, Urine: >30 /HPF — AB (ref 0–2)

## 2024-02-09 ENCOUNTER — Encounter: Payer: Self-pay | Admitting: Urology

## 2024-02-09 ENCOUNTER — Telehealth: Payer: Self-pay | Admitting: Urology

## 2024-02-09 NOTE — Telephone Encounter (Signed)
 error

## 2024-02-09 NOTE — Telephone Encounter (Signed)
 Patient needed a letter to excuse him from jury duty that was to be same day as his appointment. Uploaded letter to mychart

## 2024-02-11 ENCOUNTER — Ambulatory Visit (HOSPITAL_BASED_OUTPATIENT_CLINIC_OR_DEPARTMENT_OTHER)
Admission: RE | Admit: 2024-02-11 | Discharge: 2024-02-11 | Disposition: A | Discharge status: Home / Self Care | Source: Ambulatory Visit | Attending: Urology | Admitting: Urology

## 2024-02-11 ENCOUNTER — Telehealth: Payer: Self-pay

## 2024-02-11 DIAGNOSIS — R310 Gross hematuria: Secondary | ICD-10-CM | POA: Diagnosis present

## 2024-02-11 MED ORDER — IOHEXOL 300 MG/ML  SOLN
125.0000 mL | Freq: Once | INTRAMUSCULAR | Status: AC | PRN
  Administered 2024-02-11: 125 mL via INTRAVENOUS

## 2024-02-11 NOTE — Telephone Encounter (Signed)
 Pt stopped by the office this morning prior to his CT scan. Pt stated on Tuesday night (he saw us  on Monday), he fell trying to get off the couch and hit is right flank area. Pt was in pain while in the office with me. Pt stated that since falling, he has noticed a bit more blood and clots in his urine. Pt stated he also has noted to have been drinking less fluids. Encouraged pt to increase his fluid intake again. Pt inquired about a possible infection. Reinforced with pt urine was clear on Monday for infection. Pt denied n/v, f/c, dysuria. Pt did note a raw like sensation at the tip of penis after passing a clot, last night. Pt voiced concern of the sensation and how many clots that he is passing. Pt then inquired at which point he should be more worried about hematuria since he does not have cysto until 8/13. Reinforced with pt s/s of infection. Pt voiced understanding. Reinforced with pt would make doctor aware of conversation.

## 2024-02-12 ENCOUNTER — Encounter: Payer: Self-pay | Admitting: Urology

## 2024-02-12 ENCOUNTER — Encounter: Payer: Self-pay | Admitting: Cardiology

## 2024-02-12 ENCOUNTER — Telehealth: Payer: Self-pay

## 2024-02-12 NOTE — Telephone Encounter (Signed)
 Radiology tech called to make us  aware of CT hematuria work up concerning bladder mass found tech was advised a message will be sent to the provider

## 2024-02-12 NOTE — Telephone Encounter (Signed)
 Pt sent MyChart message about CT results. Nurse got Dr. Annis input on if something should be done immediately. Dr. Roseann stated it could wait for your return, as nothing would be done acutely. Spoke with pt via telephone and reassured him. Reinforced with pt to increase fluid intake. Also reinforced with pt would have Dr. Matilda look at results first thing Monday and give him a call with what to expect next. Pt inquired about going back on ASA 81mg . Encouraged pt to call cardiologist as she prescribed ASA. Also reinforced with pt, would get more clarity on ASA as well with Dr. Matilda on Monday. Pt voiced understanding of entire conversation.

## 2024-02-15 NOTE — Telephone Encounter (Signed)
 Per Dr. Matilda pt can keep current cysto appt for 8/13. At that appt Dr. Matilda will go over the next steps and what to expect. Pt voiced understanding.

## 2024-02-23 NOTE — Progress Notes (Signed)
 Assessment: -Gross painless hematuria  -Large bladder tumor  Plan: - I discussed TURBT with him as well as placement of postop gemcitabine   -We will get this set up in the near future.     History of Present Illness:   7.28.2025: Initial visit for evaluation and management of gross hematuria.  Starting about 6 weeks ago, he has had intermittent gross painless hematuria.  Sometimes small clots were produced.  He has no associated flank or abdominal pain.  No environmental exposure issues except for history of smoking for short period of time years ago.  No prior urologic history.  No history of kidney stones.  He did go to the [facility] health urgent care center on the 10th of this month.  At that time urinalysis revealed multiple red cells, no evidence of infection.  Creatinine was 0.91, hemoglobin 14.9.  8.13.2025: Here for cysto. Recent hematuria protocol CT- 1. Contrast enhancing endoluminal mass of the right aspect of the urinary bladder measuring 5.4 x 2.9 cm with scattered associated calcifications and retraction of the bladder wall well as some adjacent fat stranding. 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 2. No evidence of lymphadenopathy or metastatic disease in the abdomen or pelvis. 3. Punctuate nonobstructive calculus of the midportion of the right kidney. No left-sided calculi, ureteral calculi, or hydronephrosis. 4. Hepatomegaly and hepatic steatosis. 5. Coronary artery disease.  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     CHF (congestive heart failure) (HCC)    Patient denies   Chicken pox    Hyperlipidemia with target LDL less than 70 10/19/2015   MI, old    Obesity (BMI 30-39.9)  10/19/2015   OSA (obstructive sleep apnea) 10/19/2015   Sleep apnea     Past Surgical History:  Past Surgical History:  Procedure Laterality Date   CARDIAC CATHETERIZATION     COLONOSCOPY     CORONARY ANGIOPLASTY  01/25/2014   PCI of LCx and LAD   KNEE SURGERY     NASAL SINUS SURGERY     ORIF ANKLE FRACTURE Right 09/20/2021   Procedure: OPEN REDUCTION INTERNAL FIXATION (ORIF) ANKLE FRACTURE;  Surgeon: Shari Sieving, MD;  Location: [facility];  Service: Orthopedics;  Laterality: Right;    Allergies:  Allergies  Allergen Reactions   Penicillins Itching and Rash    Family History:  Family History  Problem Relation Age of Onset   Hyperlipidemia Mother    Leukemia Father    Arthritis Father    Heart attack Maternal Grandfather    Heart disease Maternal Grandfather    Hyperlipidemia Maternal Grandfather    Stroke Maternal Grandfather    Hyperlipidemia Maternal Grandmother    Colon cancer Neg Hx    Liver cancer Neg Hx    Stomach cancer Neg Hx    Rectal cancer Neg Hx    Esophageal cancer Neg Hx     Social History:  Social History   Tobacco Use   Smoking status: Former    Current packs/day: 0.00    Types: Cigarettes    Quit date: 1990    Years since quitting: 35.6   Smokeless tobacco: Never  Vaping Use   Vaping status: Never Used  Substance Use Topics   Alcohol use: Yes    Comment: occasionally   Drug use: No    Review of symptoms:  Constitutional:  Negative for unexplained weight loss, night sweats, fever, chills ENT:  Negative for nose bleeds, sinus pain, painful swallowing CV:  Negative for chest pain, shortness of breath, exercise intolerance, palpitations, loss of consciousness Resp:  Negative for cough, wheezing, shortness of breath GI:  Negative for nausea, vomiting, diarrhea, bloody stools GU:  Positives noted in HPI; otherwise negative for gross hematuria, dysuria, urinary incontinence Neuro:  Negative for seizures, poor balance, limb weakness, slurred  speech Psych:  Negative for lack of energy, depression, anxiety Endocrine:  Negative for polydipsia, polyuria, symptoms of hypoglycemia (dizziness, hunger, sweating) Hematologic:  Negative for anemia, purpura, petechia, prolonged or excessive bleeding, use of anticoagulants  Allergic:  Negative for difficulty breathing or choking as a result of exposure to anything; no shellfish allergy; no allergic response (rash/itch) to materials, foods     Results:  I have reviewed referring/prior physicians notes  I have reviewed urinalysis--microscopic hematuria noted today  I have reviewed PSA results--1.5 in 2019  CT images reviewed  Cystoscopy Procedure Note:  Indication: hematuria/bladder mass  After informed consent and discussion of the procedure and its risks, Toan Mort was positioned and prepped in the standard fashion.  Cystoscopy was performed with a flexible cystoscope.   Findings: Urethra: No urothelial lesions, no stricture Prostate: Moderate bilobar hypertrophy with obstruction Bladder neck: Open Ureteral orifices: Normal bilaterally Bladder: Large bladder tumor on the right bladder wall with some anterior bladder involvement as well.  Some calcifications present on this tumor  The patient tolerated the procedure well.

## 2024-02-24 ENCOUNTER — Ambulatory Visit: Payer: Self-pay | Admitting: Urology

## 2024-02-24 ENCOUNTER — Ambulatory Visit: Admitting: Urology

## 2024-02-24 ENCOUNTER — Other Ambulatory Visit: Payer: Self-pay | Admitting: Urology

## 2024-02-24 VITALS — BP 145/87 | HR 84 | Ht 71.0 in | Wt 275.0 lb

## 2024-02-24 DIAGNOSIS — D494 Neoplasm of unspecified behavior of bladder: Secondary | ICD-10-CM

## 2024-02-24 DIAGNOSIS — R310 Gross hematuria: Secondary | ICD-10-CM

## 2024-02-24 DIAGNOSIS — N3289 Other specified disorders of bladder: Secondary | ICD-10-CM

## 2024-02-24 LAB — URINALYSIS, ROUTINE W REFLEX MICROSCOPIC
Bilirubin, UA: NEGATIVE
Glucose, UA: NEGATIVE
Ketones, UA: NEGATIVE
Leukocytes,UA: NEGATIVE
Nitrite, UA: NEGATIVE
Specific Gravity, UA: <1.005 — ABNORMAL LOW (ref 1.005–1.030)
Urobilinogen, Ur: 0.2 mg/dL (ref 0.2–1.0)
pH, UA: 6 (ref 5.0–7.5)

## 2024-02-24 LAB — MICROSCOPIC EXAMINATION

## 2024-02-24 MED ORDER — CIPROFLOXACIN HCL 500 MG PO TABS
500.0000 mg | ORAL_TABLET | Freq: Once | ORAL | Status: AC
  Administered 2024-02-24 (×2): 500 mg via ORAL

## 2024-02-28 NOTE — Patient Instructions (Signed)
 SURGICAL WAITING ROOM VISITATION Patients having surgery or a procedure may have no more than 2 support people in the waiting area - these visitors may rotate in the visitor waiting room.   If the patient needs to stay at the hospital during part of their recovery, the visitor guidelines for inpatient rooms apply.  PRE-OP VISITATION  Pre-op nurse will coordinate an appropriate time for 1 support person to accompany the patient in pre-op.  This support person may not rotate.  This visitor will be contacted when the time is appropriate for the visitor to come back in the pre-op area.  Please refer to the [facility] website for the visitor guidelines for Inpatients (after your surgery is over and you are in a regular room).  You are not required to quarantine at this time prior to your surgery. However, you must do this: Hand Hygiene often Do NOT share personal items Notify your provider if you are in close contact with someone who has COVID or you develop fever 100.4 or greater, new onset of sneezing, cough, sore throat, shortness of breath or body aches.  If you test positive for Covid or have been in contact with anyone that has tested positive in the last 10 days please notify you surgeon.    Your procedure is scheduled on:  Monday  March 07, 2024  Report to [facility] Main Entrance: [address] entrance where the [employer] is available.   Report to admitting at: 06:30 AM  Call this number if you have any questions or problems the morning of surgery [phone]  DO NOT EAT OR DRINK ANYTHING AFTER MIDNIGHT THE NIGHT PRIOR TO YOUR SURGERY / PROCEDURE.   FOLLOW  ANY ADDITIONAL PRE OP INSTRUCTIONS YOU RECEIVED FROM YOUR SURGEON'S OFFICE!!!   Oral Hygiene is also important to reduce your risk of infection.        Remember - BRUSH YOUR TEETH THE MORNING OF SURGERY WITH YOUR REGULAR TOOTHPASTE  Do NOT smoke after Midnight the night before surgery.  STOP TAKING all Vitamins,  Herbs and supplements 1 week before your surgery.   Take ONLY these medicines the morning of surgery with A SIP OF WATER: none   If You have been diagnosed with Sleep Apnea - Bring CPAP mask and tubing day of surgery. We will provide you with a CPAP machine on the day of your surgery.                   You may not have any metal on your body including  jewelry, and body piercing  Do not wear  lotions, powders, cologne, or deodorant  Men may shave face and neck.  Contacts, Hearing Aids, dentures or bridgework may not be worn into surgery. DENTURES WILL BE REMOVED PRIOR TO SURGERY PLEASE DO NOT APPLY Poly grip OR ADHESIVES!!!  Patients discharged on the day of surgery will not be allowed to drive home.  Someone NEEDS to stay with you for the first 24 hours after anesthesia.  Do not bring your home medications to the hospital. The Pharmacy will dispense medications listed on your medication list to you during your admission in the Hospital.  Special Instructions: Bring a copy of your healthcare power of attorney and living will documents the day of surgery, if you wish to have them scanned into your [facility] Records- EPIC  Please read over the following fact sheets you were given: IF YOU HAVE QUESTIONS ABOUT YOUR PRE-OP INSTRUCTIONS, PLEASE CALL [phone].  [facility] - Preparing for Surgery Before surgery, you can play an important role.  Because skin is not sterile, your skin needs to be as free of germs as possible.  You can reduce the number of germs on your skin by washing with CHG (chlorahexidine gluconate) soap before surgery.  CHG is an antiseptic cleaner which kills germs and bonds with the skin to continue killing germs even after washing. Please DO NOT use if you have an allergy to CHG or antibacterial soaps.  If your skin becomes reddened/irritated stop using the CHG and inform your nurse when you arrive at Short Stay. Do not shave (including legs and underarms)  for at least 48 hours prior to the first CHG shower.  You may shave your face/neck.  Please follow these instructions carefully:  1.  Shower with CHG Soap the night before surgery and the  morning of surgery.  2.  If you choose to wash your hair, wash your hair first as usual with your normal  shampoo.  3.  After you shampoo, rinse your hair and body thoroughly to remove the shampoo.                             4.  Use CHG as you would any other liquid soap.  You can apply chg directly to the skin and wash.  Gently with a scrungie or clean washcloth.  5.  Apply the CHG Soap to your body ONLY FROM THE NECK DOWN.   Do not use on face/ open                           Wound or open sores. Avoid contact with eyes, ears mouth and genitals (private parts).                       Wash face,  Genitals (private parts) with your normal soap.             6.  Wash thoroughly, paying special attention to the area where your  surgery  will be performed.  7.  Thoroughly rinse your body with warm water from the neck down.  8.  DO NOT shower/wash with your normal soap after using and rinsing off the CHG Soap.            9.  Pat yourself dry with a clean towel.            10.  Wear clean pajamas.            11.  Place clean sheets on your bed the night of your first shower and do not  sleep with pets.  ON THE DAY OF SURGERY : Do not apply any lotions/deodorants the morning of surgery.  Please wear clean clothes to the hospital/surgery center.    FAILURE TO FOLLOW THESE INSTRUCTIONS MAY RESULT IN THE CANCELLATION OF YOUR SURGERY  PATIENT SIGNATURE_________________________________  NURSE SIGNATURE__________________________________  ________________________________________________________________________

## 2024-02-28 NOTE — Progress Notes (Signed)
 COVID Vaccine received:  [x]  No []  Yes Date of any COVID positive Test in last 90 days: none  PCP - none  process of getting new MD (Dr. Sebastian at [facility]) Cardiologist - Wilbert Bihari, MD   Chest x-ray -  EKG - 10-14-2023  Epic  Stress Test -  ECHO - 06-29-2014  Epic Cardiac Cath - 01-25-2014  LHC / BMS x 2, done at [facility]  CT Coronary Calcium  score:   Bowel Prep - [x]  No  []   Yes ______  Pacemaker / ICD device [x]  No []  Yes   Spinal Cord Stimulator:[x]  No []  Yes       History of Sleep Apnea? []  No [x]  Yes  Study in 2009 CPAP used?- []  No [x]  Yes    Patient has: []  NO Hx DM   [x]  Pre-DM   []  DM1  []   DM2 Does the patient monitor blood sugar?   []  N/A   [x]  No []  Yes  Last A1c was:  6.1 on   10-14-2023   Aspirin  Instructions:  none  ERAS Protocol Ordered: [x]  No  []  Yes Patient is to be NPO after: [place] Prior  Dental hx: []  Dentures:  [x]  N/A      []  Bridge or Partial:                   []  Loose or Damaged teeth:   Activity level: Able to walk up 2 flights of stairs without becoming significantly short of breath or having chest pain?  []  No   [x]    Yes  Patient can perform ADLs without assistance. []  No   [x]   Yes  Anesthesia review: CAD, s/p anterior MI - LHC / BMS x 2 in 01-2014 in [place] , HTN, Pre-DM, OSA- CPAP  Patient denies any S&S of respiratory illness or Covid - no shortness of breath, fever, cough or chest pain at PAT appointment.  Patient verbalized understanding and agreement to the Pre-Surgical Instructions that were given to them at this PAT appointment. Patient was also educated of the need to review these PAT instructions again prior to his surgery.I reviewed the appropriate phone numbers to call if they have any and questions or concerns.

## 2024-02-29 ENCOUNTER — Telehealth: Payer: Self-pay

## 2024-02-29 NOTE — Telephone Encounter (Signed)
   Pre-operative Risk Assessment    Patient Name: Peter Oconnor  DOB: 1960/07/20 MRN: [id number]   Date of last office visit: 10/14/23 WILBERT BIHARI, MD Date of next office visit: 03/04/24 LYNWOOD SCHILLING, MD   Request for Surgical Clearance    Procedure:  TURBT  Date of Surgery:  Clearance 03/07/24                                Surgeon:  DR GARNETTE SHACK Surgeon's Group or Practice Name:  [facility] [place] Phone number:  [phone] Fax number:  [phone]   Type of Clearance Requested:   - Medical    Type of Anesthesia:  Not Indicated   Additional requests/questions:    Signed, Lucie DELENA Ku   02/29/2024, 10:34 AM

## 2024-03-01 ENCOUNTER — Encounter (HOSPITAL_COMMUNITY)
Admission: RE | Admit: 2024-03-01 | Discharge: 2024-03-01 | Disposition: A | Discharge status: Home / Self Care | Source: Ambulatory Visit | Attending: Urology | Admitting: Urology

## 2024-03-01 ENCOUNTER — Encounter (HOSPITAL_COMMUNITY): Payer: Self-pay

## 2024-03-01 ENCOUNTER — Other Ambulatory Visit: Payer: Self-pay

## 2024-03-01 VITALS — BP 130/65 | HR 68 | Temp 98.7°F | Resp 20 | Ht 71.0 in | Wt 274.0 lb

## 2024-03-01 DIAGNOSIS — Z01818 Encounter for other preprocedural examination: Secondary | ICD-10-CM | POA: Diagnosis present

## 2024-03-01 DIAGNOSIS — Z955 Presence of coronary angioplasty implant and graft: Secondary | ICD-10-CM | POA: Insufficient documentation

## 2024-03-01 DIAGNOSIS — I110 Hypertensive heart disease with heart failure: Secondary | ICD-10-CM | POA: Insufficient documentation

## 2024-03-01 DIAGNOSIS — G4733 Obstructive sleep apnea (adult) (pediatric): Secondary | ICD-10-CM | POA: Insufficient documentation

## 2024-03-01 DIAGNOSIS — I252 Old myocardial infarction: Secondary | ICD-10-CM | POA: Diagnosis not present

## 2024-03-01 DIAGNOSIS — I509 Heart failure, unspecified: Secondary | ICD-10-CM | POA: Diagnosis not present

## 2024-03-01 DIAGNOSIS — I10 Essential (primary) hypertension: Secondary | ICD-10-CM

## 2024-03-01 DIAGNOSIS — Z01812 Encounter for preprocedural laboratory examination: Secondary | ICD-10-CM | POA: Diagnosis not present

## 2024-03-01 DIAGNOSIS — I2510 Atherosclerotic heart disease of native coronary artery without angina pectoris: Secondary | ICD-10-CM | POA: Diagnosis not present

## 2024-03-01 DIAGNOSIS — N3289 Other specified disorders of bladder: Secondary | ICD-10-CM | POA: Insufficient documentation

## 2024-03-01 HISTORY — DX: Malignant (primary) neoplasm, unspecified: C80.1

## 2024-03-01 HISTORY — DX: Prediabetes: R73.03

## 2024-03-01 HISTORY — DX: Essential (primary) hypertension: I10

## 2024-03-01 LAB — BASIC METABOLIC PANEL WITH GFR
Anion gap: 10 (ref 5–15)
BUN: 11 mg/dL (ref 6–20)
CO2: 24 mmol/L (ref 22–32)
Calcium: 9.2 mg/dL (ref 8.9–10.3)
Chloride: 105 mmol/L (ref 98–111)
Creatinine, Ser: 0.86 mg/dL (ref 0.61–1.24)
GFR, Estimated: >60 mL/min (ref >60)
Glucose, Bld: 106 mg/dL — ABNORMAL HIGH (ref 70–99)
Potassium: 4.3 mmol/L (ref 3.5–5.1)
Sodium: 139 mmol/L (ref 135–145)

## 2024-03-01 LAB — CBC
HCT: 44.4 % (ref 39.0–52.0)
Hemoglobin: 14.3 g/dL (ref 13.0–17.0)
MCH: 30.2 pg (ref 26.0–34.0)
MCHC: 32.2 g/dL (ref 30.0–36.0)
MCV: 93.7 fL (ref 80.0–100.0)
Platelets: 187 K/uL (ref 150–400)
RBC: 4.74 MIL/uL (ref 4.22–5.81)
RDW: 12.8 % (ref 11.5–15.5)
WBC: 5.2 K/uL (ref 4.0–10.5)
nRBC: 0 % (ref 0.0–0.2)

## 2024-03-01 NOTE — Telephone Encounter (Signed)
   Name: Peter Oconnor  DOB: Mar 25, 1961  MRN: [id number]  Primary Cardiologist: Wilbert Bihari, MD  Chart reviewed as part of pre-operative protocol coverage. The patient has an upcoming visit scheduled with Dr. Lavona on 03/04/2024 at which time clearance can be addressed in case there are any issues that would impact surgical recommendations.  TURBT is not scheduled until 03/07/2024 as below. I added preop FYI to appointment note so that provider is aware to address at time of outpatient visit.  Per office protocol the cardiology provider should forward their finalized clearance decision and recommendations regarding antiplatelet therapy to the requesting party below.    I will route this message as FYI to requesting party and remove this message from the preop box as separate preop APP input not needed at this time.   Please call with any questions.  Lum LITTIE Louis, NP  03/01/2024, 8:15 AM

## 2024-03-02 ENCOUNTER — Ambulatory Visit: Admitting: Family Medicine

## 2024-03-02 ENCOUNTER — Ambulatory Visit: Payer: Self-pay | Admitting: Urology

## 2024-03-03 DIAGNOSIS — Z01818 Encounter for other preprocedural examination: Secondary | ICD-10-CM | POA: Insufficient documentation

## 2024-03-03 DIAGNOSIS — Z01810 Encounter for preprocedural cardiovascular examination: Secondary | ICD-10-CM | POA: Insufficient documentation

## 2024-03-03 DIAGNOSIS — Z8679 Personal history of other diseases of the circulatory system: Secondary | ICD-10-CM | POA: Insufficient documentation

## 2024-03-03 NOTE — Progress Notes (Unsigned)
 Cardiology Office Note:   Date:  03/04/2024  ID:  Peter Oconnor, DOB 1960/10/05, MRN [id number] PCP: Pcp, No  [facility] Providers Cardiologist:  Wilbert Bihari, MD {  History of Present Illness:   Peter Oconnor is a 63 y.o. male  with history of arthritis, tobacco abuse, HTN, CAD, hyperlipidemia and sleep apnea He has an anterior MI in July 201 in [place] . Cardiac cath 2015 with mild RCA stenosis, severe LAD stenosis and occluded Circumflex. He had stenting of his LAD and Circumflex at that time. Echo in 2015 with LVEF=60-65%. He was seen in cardiology at [facility] in July 2021. Beta blocker stopped due to erectile dysfunction.  He was lost to follow-up but reestablished care.  He seens Dr. Bihari.  He is on my schedule today for preop clearance.    He is due to have a bladder procedure for hematuria and a bladder mass.  He has had no new cardiovascular complaints.The patient denies any new symptoms such as chest discomfort, neck or arm discomfort. There has been no new shortness of breath, PND or orthopnea. There have been no reported palpitations, presyncope or syncope.   He physically active.is he went hiking in July.  He has not had any new cardiovascular symptoms similar to his previous angina.    ROS: As stated in the HPI and negative for all other systems.  Studies Reviewed:    EKG:   EKG Interpretation Date/Time:  Friday March 04 2024 09:50:31 EDT Ventricular Rate:  70 PR Interval:  144 QRS Duration:  84 QT Interval:  384 QTC Calculation: 414 R Axis:   58  Text Interpretation: Normal sinus rhythm Nonspecific T wave abnormality When compared with ECG of 14-Oct-2023 09:07, No significant change was found Confirmed by Lavona Agent ([id number]) on 03/04/2024 10:27:01 AM    Risk Assessment/Calculations:     Physical Exam:   VS:  BP (!) 146/74   Pulse 70   Ht 5' 11 (1.803 m)   Wt 274 lb (124.3 kg)   SpO2 95%   BMI 38.22 kg/m    Wt Readings from Last 3  Encounters:  03/04/24 274 lb (124.3 kg)  03/01/24 274 lb (124.3 kg)  02/24/24 275 lb (124.7 kg)     GEN: Well nourished, well developed in no acute distress NECK: No JVD; No carotid bruits CARDIAC: RRR, 3 out of 6 apical systolic murmur heard at the apex and radiating at the with outflow tract and early peaking, no diastolic murmurs, rubs, gallops RESPIRATORY:  Clear to auscultation without rales, wheezing or rhonchi  ABDOMEN: Soft, non-tender, non-distended EXTREMITIES:  No edema; No deformity   ASSESSMENT AND PLAN:    Preop: The patient is at acceptable risk for the planned procedure.  He has a high functional level and no unstable symptoms.  He is not going for high risk procedure.  No further cardiovascular testing is indicated prior to surgery according to ACC/AHA guidelines.  ASCAD: He is not having any symptoms.  He needs aggressive risk reduction.   Hypertension: His blood pressure is not at target but he says it is well-controlled at home and he should keep a blood pressure diary and send it into Dr. Bihari.  We talked about goals of therapy.  Hyperlipidemia: His LDL was 96 which was improved from previous but not at target.  I think it should be 55.  He and I talked about this.  He does not want to change his statin as he wants to  try to do this with diet.  I have suggested that he get a follow-up lipid profile with an LP(a) and follow-up with Dr. Shlomo for goals of therapy.  Murmur: I suspect he has some mild aortic stenosis.  I will check an echocardiogram.  Follow up with Dr. Shlomo as previously suggested.  Signed, Lynwood Schilling, MD

## 2024-03-04 ENCOUNTER — Encounter: Payer: Self-pay | Admitting: Cardiology

## 2024-03-04 ENCOUNTER — Ambulatory Visit: Attending: Cardiology | Admitting: Cardiology

## 2024-03-04 VITALS — BP 146/74 | HR 70 | Ht 71.0 in | Wt 274.0 lb

## 2024-03-04 DIAGNOSIS — E785 Hyperlipidemia, unspecified: Secondary | ICD-10-CM

## 2024-03-04 DIAGNOSIS — Z8679 Personal history of other diseases of the circulatory system: Secondary | ICD-10-CM

## 2024-03-04 DIAGNOSIS — I10 Essential (primary) hypertension: Secondary | ICD-10-CM | POA: Diagnosis not present

## 2024-03-04 DIAGNOSIS — Z01810 Encounter for preprocedural cardiovascular examination: Secondary | ICD-10-CM | POA: Diagnosis not present

## 2024-03-04 NOTE — Progress Notes (Signed)
 Anesthesia Chart Review   Case: [id number] Date/Time: 03/07/24 0841   Procedures:      TURBT, WITH CHEMOTHERAPEUTIC AGENT INSTILLATION INTO BLADDER - gemcitabine     INSTILLATION, BLADDER   Anesthesia type: General   Diagnosis: Bladder mass [N32.89]   Pre-op diagnosis: Bladder Mass   Location: [facility]   Surgeons: Matilda Senior, MD       DISCUSSION:62 y.o. former smoker with h/o OSA, HTN, CAD (stent to LAD and Circumflex 2015), CHF, bladder mass scheduled for above procedure 03/07/2024 with Dr. Senior Matilda.    Pt with h/o anterior MI July 2015 in [place] .  Cardiac cath 2015 with mild RCA stenosis, severe LAD stenosis and occluded Circumflex. He had stenting of his LAD and Circumflex at that time. Echo in 2015 with LVEF=60-65%.    Pt seen by cardiology 03/04/2024 for preoperative evaluation.  Per OV note,  Preop: The patient is at acceptable risk for the planned procedure.  He has a high functional level and no unstable symptoms.  He is not going for high risk procedure.  No further cardiovascular testing is indicated prior to surgery according to ACC/AHA guidelines.  On exam 3/6 apical systolic murmur heard.  Per notes Dr. Lavona suspects mild AS and Echo has been ordered, scheduled in November.   VS: BP 130/65 Comment: right arm sitting  Pulse 68   Temp 37.1 C (Oral)   Resp 20   Ht 5' 11 (1.803 m)   Wt 124.3 kg   SpO2 96%   BMI 38.22 kg/m   PROVIDERS: Pcp, No  Lavona Agent, MD is Cardiologist LABS: Labs reviewed: Acceptable for surgery. (all labs ordered are listed, but only abnormal results are displayed)  Labs Reviewed  BASIC METABOLIC PANEL WITH GFR - Abnormal; Notable for the following components:      Result Value   Glucose, Bld 106 (*)    All other components within normal limits  CBC     IMAGES:   EKG:   CV: Echo 01/27/2014 Left ventricular ejection fraction, by visual estimation, is 60-65% Borderline evidence of left  ventricular hypertrophy Moderate lateral myocardial infarction Mild tricuspid regurgitation is visualized Mild to moderate aortic valve sclerosis/calcification without any evidence of aortic stenosis.  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     Cancer [facility])    Bladder   CHF (congestive heart failure) (HCC)    Patient denies   Chicken pox    Hyperlipidemia with target LDL less than 70 10/19/2015   Hypertension    MI, old    Obesity (BMI 30-39.9) 10/19/2015   OSA (obstructive sleep apnea) 10/19/2015   Pre-diabetes    Sleep apnea     Past Surgical History:  Procedure Laterality Date   CARDIAC CATHETERIZATION  2015   LHC w/ BMS x2 LCx and LAD  done in [place]    COLONOSCOPY     x1   EYE SURGERY Right 2014   Laser repair   done at [facility]   KNEE SURGERY Right 2000   torn Meniscus repair   done in [place]    NASAL SINUS SURGERY     ORIF ANKLE FRACTURE Right 09/20/2021   Procedure: OPEN REDUCTION INTERNAL FIXATION (ORIF) ANKLE  FRACTURE;  Surgeon: Shari Sieving, MD;  Location: [facility];  Service: Orthopedics;  Laterality: Right;    MEDICATIONS:  Collagen-Vitamin C-Biotin (COLLAGEN PO)   ibuprofen  (ADVIL ) 200 MG tablet   lisinopril  (ZESTRIL ) 5 MG tablet   rosuvastatin  (CRESTOR ) 40 MG tablet    0.9 %  sodium chloride  infusion   Ennifer Harston Ward, PA-C [facility]-Surgical Testing [phone]

## 2024-03-04 NOTE — Patient Instructions (Signed)
 Medication Instructions:  Continue same medications *If you need a refill on your cardiac medications before your next appointment, please call your pharmacy*  Lab Work: None ordered  Testing/Procedures: Echo   first available  Follow-Up: At [facility], you and your health needs are our priority.  As part of our continuing mission to provide you with exceptional heart care, our providers are all part of one team.  This team includes your primary Cardiologist (physician) and Advanced Practice Providers or APPs (Physician Assistants and Nurse Practitioners) who all work together to provide you with the care you need, when you need it.  Your next appointment:  After echo    Provider:  Dr.Turner   We recommend signing up for the patient portal called MyChart.  Sign up information is provided on this After Visit Summary.  MyChart is used to connect with patients for Virtual Visits (Telemedicine).  Patients are able to view lab/test results, encounter notes, upcoming appointments, etc.  Non-urgent messages can be sent to your provider as well.   To learn more about what you can do with MyChart, go to [email or web address].

## 2024-03-07 ENCOUNTER — Other Ambulatory Visit: Payer: Self-pay

## 2024-03-07 ENCOUNTER — Ambulatory Visit (HOSPITAL_BASED_OUTPATIENT_CLINIC_OR_DEPARTMENT_OTHER): Payer: Self-pay | Admitting: Physician Assistant

## 2024-03-07 ENCOUNTER — Ambulatory Visit (HOSPITAL_COMMUNITY): Payer: Self-pay | Admitting: Physician Assistant

## 2024-03-07 ENCOUNTER — Ambulatory Visit (HOSPITAL_COMMUNITY)
Admission: RE | Admit: 2024-03-07 | Discharge: 2024-03-07 | Disposition: A | Discharge status: Home / Self Care | Attending: Urology | Admitting: Urology

## 2024-03-07 ENCOUNTER — Encounter (HOSPITAL_COMMUNITY): Payer: Self-pay | Admitting: Urology

## 2024-03-07 ENCOUNTER — Encounter (HOSPITAL_COMMUNITY)
Admission: RE | Disposition: A | Discharge status: Home / Self Care | Payer: Self-pay | Source: Home / Self Care | Attending: Urology

## 2024-03-07 DIAGNOSIS — I10 Essential (primary) hypertension: Secondary | ICD-10-CM

## 2024-03-07 DIAGNOSIS — C672 Malignant neoplasm of lateral wall of bladder: Secondary | ICD-10-CM | POA: Insufficient documentation

## 2024-03-07 DIAGNOSIS — D494 Neoplasm of unspecified behavior of bladder: Secondary | ICD-10-CM | POA: Diagnosis not present

## 2024-03-07 DIAGNOSIS — Z955 Presence of coronary angioplasty implant and graft: Secondary | ICD-10-CM | POA: Diagnosis not present

## 2024-03-07 DIAGNOSIS — Z8249 Family history of ischemic heart disease and other diseases of the circulatory system: Secondary | ICD-10-CM | POA: Diagnosis not present

## 2024-03-07 DIAGNOSIS — C679 Malignant neoplasm of bladder, unspecified: Secondary | ICD-10-CM

## 2024-03-07 DIAGNOSIS — Z87891 Personal history of nicotine dependence: Secondary | ICD-10-CM | POA: Insufficient documentation

## 2024-03-07 DIAGNOSIS — I2510 Atherosclerotic heart disease of native coronary artery without angina pectoris: Secondary | ICD-10-CM

## 2024-03-07 DIAGNOSIS — G4730 Sleep apnea, unspecified: Secondary | ICD-10-CM | POA: Insufficient documentation

## 2024-03-07 DIAGNOSIS — Z8261 Family history of arthritis: Secondary | ICD-10-CM | POA: Insufficient documentation

## 2024-03-07 DIAGNOSIS — N3289 Other specified disorders of bladder: Secondary | ICD-10-CM

## 2024-03-07 DIAGNOSIS — M1990 Unspecified osteoarthritis, unspecified site: Secondary | ICD-10-CM | POA: Diagnosis not present

## 2024-03-07 DIAGNOSIS — I252 Old myocardial infarction: Secondary | ICD-10-CM | POA: Diagnosis not present

## 2024-03-07 HISTORY — PX: BLADDER INSTILLATION: SHX6893

## 2024-03-07 LAB — GLUCOSE, CAPILLARY: Glucose-Capillary: 129 mg/dL — ABNORMAL HIGH (ref 70–99)

## 2024-03-07 SURGERY — TURBT, WITH CHEMOTHERAPEUTIC AGENT INSTILLATION INTO BLADDER
Anesthesia: General

## 2024-03-07 MED ORDER — ACETAMINOPHEN 500 MG PO TABS
1000.0000 mg | ORAL_TABLET | Freq: Once | ORAL | Status: AC
  Administered 2024-03-07: 1000 mg via ORAL

## 2024-03-07 MED ORDER — LACTATED RINGERS IV SOLN: INTRAVENOUS | Status: DC | PRN

## 2024-03-07 MED ORDER — ORAL CARE MOUTH RINSE
15.0000 mL | Freq: Once | OROMUCOSAL | Status: AC
  Administered 2024-03-07: 15 mL via OROMUCOSAL

## 2024-03-07 MED ORDER — CEFAZOLIN SODIUM-DEXTROSE 3-4 GM/150ML-% IV SOLN
3.0000 g | INTRAVENOUS | Status: AC
  Administered 2024-03-07: 3 g via INTRAVENOUS

## 2024-03-07 MED ORDER — OXYCODONE HCL 5 MG PO TABS: 5.0000 mg | ORAL_TABLET | ORAL | 0 refills | Status: AC | PRN

## 2024-03-07 MED ORDER — ROCURONIUM BROMIDE 10 MG/ML (PF) SYRINGE
PREFILLED_SYRINGE | INTRAVENOUS | Status: AC
  Filled 2024-03-07: qty 10

## 2024-03-07 MED ORDER — FENTANYL CITRATE (PF) 100 MCG/2ML IJ SOLN
INTRAMUSCULAR | Status: DC | PRN
  Administered 2024-03-07: 100 ug via INTRAVENOUS

## 2024-03-07 MED ORDER — ACETAMINOPHEN 500 MG PO TABS
ORAL_TABLET | ORAL | Status: AC
  Filled 2024-03-07: qty 2

## 2024-03-07 MED ORDER — SUGAMMADEX SODIUM 200 MG/2ML IV SOLN
INTRAVENOUS | Status: AC
Start: 2024-03-07 — End: 2024-03-07
  Filled 2024-03-07: qty 2

## 2024-03-07 MED ORDER — MIDAZOLAM HCL 2 MG/2ML IJ SOLN: 0.5000 mg | Freq: Once | INTRAMUSCULAR | Status: DC | PRN

## 2024-03-07 MED ORDER — MIDAZOLAM HCL 2 MG/2ML IJ SOLN
INTRAMUSCULAR | Status: DC | PRN
  Administered 2024-03-07: 2 mg via INTRAVENOUS

## 2024-03-07 MED ORDER — STERILE WATER FOR IRRIGATION IR SOLN
Status: DC | PRN
Start: 2024-03-07 — End: 2024-03-07
  Administered 2024-03-07: 500 mL

## 2024-03-07 MED ORDER — MIDAZOLAM HCL 2 MG/2ML IJ SOLN
INTRAMUSCULAR | Status: AC
  Filled 2024-03-07: qty 2

## 2024-03-07 MED ORDER — MEPERIDINE HCL 100 MG/ML IJ SOLN: 6.2500 mg | INTRAMUSCULAR | Status: DC | PRN

## 2024-03-07 MED ORDER — LACTATED RINGERS IV SOLN: INTRAVENOUS | Status: DC

## 2024-03-07 MED ORDER — OXYCODONE HCL 5 MG PO TABS
ORAL_TABLET | ORAL | Status: AC
  Filled 2024-03-07: qty 1

## 2024-03-07 MED ORDER — OXYCODONE HCL 5 MG PO TABS
5.0000 mg | ORAL_TABLET | Freq: Once | ORAL | Status: AC | PRN
  Administered 2024-03-07: 5 mg via ORAL

## 2024-03-07 MED ORDER — PROPOFOL 10 MG/ML IV BOLUS
INTRAVENOUS | Status: DC | PRN
  Administered 2024-03-07: 200 mg via INTRAVENOUS

## 2024-03-07 MED ORDER — ONDANSETRON HCL 4 MG/2ML IJ SOLN
INTRAMUSCULAR | Status: AC
  Filled 2024-03-07: qty 2

## 2024-03-07 MED ORDER — ONDANSETRON HCL 4 MG/2ML IJ SOLN
INTRAMUSCULAR | Status: DC | PRN
  Administered 2024-03-07: 4 mg via INTRAVENOUS

## 2024-03-07 MED ORDER — OXYCODONE HCL 5 MG/5ML PO SOLN: 5.0000 mg | Freq: Once | ORAL | Status: AC | PRN

## 2024-03-07 MED ORDER — DEXAMETHASONE SODIUM PHOSPHATE 10 MG/ML IJ SOLN
INTRAMUSCULAR | Status: AC
  Filled 2024-03-07: qty 1

## 2024-03-07 MED ORDER — ROCURONIUM BROMIDE 100 MG/10ML IV SOLN
INTRAVENOUS | Status: DC | PRN
  Administered 2024-03-07: 60 mg via INTRAVENOUS
  Administered 2024-03-07: 10 mg via INTRAVENOUS

## 2024-03-07 MED ORDER — GEMCITABINE CHEMO FOR BLADDER INSTILLATION 2000 MG
2000.0000 mg | Freq: Once | INTRAVENOUS | Status: AC
  Administered 2024-03-07: 2000 mg via INTRAVESICAL
  Filled 2024-03-07: qty 2000

## 2024-03-07 MED ORDER — SUGAMMADEX SODIUM 200 MG/2ML IV SOLN
INTRAVENOUS | Status: DC | PRN
  Administered 2024-03-07: 200 mg via INTRAVENOUS

## 2024-03-07 MED ORDER — PROPOFOL 10 MG/ML IV BOLUS
INTRAVENOUS | Status: AC
  Filled 2024-03-07: qty 20

## 2024-03-07 MED ORDER — DEXAMETHASONE SODIUM PHOSPHATE 10 MG/ML IJ SOLN
INTRAMUSCULAR | Status: DC | PRN
  Administered 2024-03-07: 10 mg via INTRAVENOUS

## 2024-03-07 MED ORDER — CEFAZOLIN SODIUM-DEXTROSE 3-4 GM/150ML-% IV SOLN
INTRAVENOUS | Status: AC
  Filled 2024-03-07: qty 150

## 2024-03-07 MED ORDER — CHLORHEXIDINE GLUCONATE 0.12 % MT SOLN: 15.0000 mL | Freq: Once | OROMUCOSAL | Status: AC

## 2024-03-07 MED ORDER — FENTANYL CITRATE (PF) 100 MCG/2ML IJ SOLN
INTRAMUSCULAR | Status: AC
  Filled 2024-03-07: qty 2

## 2024-03-07 MED ORDER — SODIUM CHLORIDE 0.9 % IR SOLN
Status: DC | PRN
  Administered 2024-03-07: 18000 mL via INTRAVESICAL

## 2024-03-07 MED ORDER — LIDOCAINE HCL (CARDIAC) PF 100 MG/5ML IV SOSY
PREFILLED_SYRINGE | INTRAVENOUS | Status: DC | PRN
  Administered 2024-03-07: 10 mg via INTRAVENOUS

## 2024-03-07 MED ORDER — LIDOCAINE HCL (PF) 2 % IJ SOLN
INTRAMUSCULAR | Status: AC
  Filled 2024-03-07: qty 5

## 2024-03-07 MED ORDER — CEPHALEXIN 500 MG PO CAPS: 500.0000 mg | ORAL_CAPSULE | Freq: Two times a day (BID) | ORAL | 0 refills | Status: DC

## 2024-03-07 MED ORDER — FENTANYL CITRATE PF 50 MCG/ML IJ SOSY: 25.0000 ug | PREFILLED_SYRINGE | INTRAMUSCULAR | Status: DC | PRN

## 2024-03-07 SURGICAL SUPPLY — 17 items
BAG URINE DRAIN 2000ML AR STRL (UROLOGICAL SUPPLIES) IMPLANT
BAG URO CATCHER STRL LF (MISCELLANEOUS) ×1 IMPLANT
CATH FOLEY 2WAY SLVR 30CC 24FR (CATHETERS) IMPLANT
CATH FOLEY 2WAY SLVR 5CC 20FR (CATHETERS) IMPLANT
DRAPE FOOT SWITCH (DRAPES) ×1 IMPLANT
EVACUATOR MICROVAS BLADDER (UROLOGICAL SUPPLIES) IMPLANT
GLOVE SURG LX STRL 8.0 MICRO (GLOVE) ×1 IMPLANT
GOWN STRL REUS W/ TWL XL LVL3 (GOWN DISPOSABLE) ×1 IMPLANT
KIT TURNOVER KIT A (KITS) ×1 IMPLANT
LOOP CUT BIPOLAR 24F LRG (ELECTROSURGICAL) ×1 IMPLANT
MANIFOLD NEPTUNE II (INSTRUMENTS) ×1 IMPLANT
NDL SAFETY ECLIPSE 18X1.5 (NEEDLE) ×1 IMPLANT
PACK CYSTO (CUSTOM PROCEDURE TRAY) ×1 IMPLANT
SYRINGE TOOMEY IRRIG 70ML (MISCELLANEOUS) IMPLANT
TUBING CONNECTING 10 (TUBING) ×1 IMPLANT
TUBING UROLOGY SET (TUBING) ×1 IMPLANT
WATER STERILE IRR 3000ML UROMA (IV SOLUTION) ×1 IMPLANT

## 2024-03-07 NOTE — Anesthesia Procedure Notes (Signed)
 Procedure Name: Intubation Date/Time: 03/07/2024 9:15 AM  Performed by: Deeann Eva BROCKS, CRNAPre-anesthesia Checklist: Patient identified, Emergency Drugs available, Suction available and Patient being monitored Patient Re-evaluated:Patient Re-evaluated prior to induction Oxygen Delivery Method: Circle System Utilized Preoxygenation: Pre-oxygenation with 100% oxygen Induction Type: IV induction Ventilation: Mask ventilation without difficulty Laryngoscope Size: Mac and 3 Grade View: Grade I Tube type: Oral Tube size: 9.0 mm Number of attempts: 1 Airway Equipment and Method: Stylet and Oral airway Placement Confirmation: ETT inserted through vocal cords under direct vision, positive ETCO2 and breath sounds checked- equal and bilateral Secured at: 23 cm Tube secured with: Tape Dental Injury: Teeth and Oropharynx as per pre-operative assessment

## 2024-03-07 NOTE — Anesthesia Postprocedure Evaluation (Signed)
 Anesthesia Post Note  Patient: Peter Oconnor  Procedure(s) Performed: TURBT, WITH CHEMOTHERAPEUTIC AGENT INSTILLATION INTO BLADDER INSTILLATION, BLADDER     Patient location during evaluation: PACU Anesthesia Type: General Level of consciousness: awake and alert, oriented and patient cooperative Pain management: pain level controlled Vital Signs Assessment: post-procedure vital signs reviewed and stable Respiratory status: spontaneous breathing, nonlabored ventilation and respiratory function stable Cardiovascular status: blood pressure returned to baseline and stable Postop Assessment: no apparent nausea or vomiting and adequate PO intake Anesthetic complications: no   No notable events documented.  Last Vitals:  Vitals:   03/07/24 1030 03/07/24 1045  BP: 138/83 (!) 142/75  Pulse: 61 63  Resp: 16 15  Temp: 37.1 C   SpO2: 95% 98%    Last Pain:  Vitals:   03/07/24 1050  TempSrc:   PainSc: 4                  Elliott Quade,E. Dave Mannes

## 2024-03-07 NOTE — Discharge Instructions (Signed)
 You may see some blood in the urine and may have some burning with urination for 48-72 hours. You also may notice that you have to urinate more frequently or urgently after your procedure which is normal.  You should call should you develop an inability urinate, fever > 101, persistent nausea and vomiting that prevents you from eating or drinking to stay hydrated. If you have a catheter, you will be taught how to take care of the catheter by the nursing staff prior to discharge from the hospital.  You may periodically feel a strong urge to void with the catheter in place.  This is a bladder spasm and most often can occur when having a bowel movement or moving around. It is typically self-limited and usually will stop after a few minutes.  You may use some Vaseline or Neosporin around the tip of the catheter to reduce friction at the tip of the penis. You may also see some blood in the urine.  A very small amount of blood can make the urine look quite red.  As long as the catheter is draining well, there usually is not a problem.  However, if the catheter is not draining well and is bloody, you should call the office [phone]) to notify us .  It is okay to remove the catheter as instructed by the nurses on Wednesday morning.

## 2024-03-07 NOTE — Transfer of Care (Signed)
 Immediate Anesthesia Transfer of Care Note  Patient: Peter Oconnor  Procedure(s) Performed: TURBT, WITH CHEMOTHERAPEUTIC AGENT INSTILLATION INTO BLADDER INSTILLATION, BLADDER  Patient Location: PACU  Anesthesia Type:General  Level of Consciousness: awake and alert   Airway & Oxygen Therapy: Patient Spontanous Breathing and Patient connected to face mask oxygen  Post-op Assessment: Report given to RN and Post -op Vital signs reviewed and stable  Post vital signs: Reviewed and stable  Last Vitals:  Vitals Value Taken Time  BP 158/84 03/07/24 10:16  Temp    Pulse 69 03/07/24 10:18  Resp 16 03/07/24 10:18  SpO2 99 % 03/07/24 10:18  Vitals shown include unfiled device data.  Last Pain:  Vitals:   03/07/24 0718  TempSrc:   PainSc: 0-No pain         Complications: No notable events documented.

## 2024-03-07 NOTE — H&P (Signed)
 H&P  Chief Complaint: Bladder tumor  History of Present Illness: 63 yo male presents for TURBT/gemcitabine  admin for mgmt of a > 5 cm Rt sided bladder tumor.  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     Cancer [facility])    Bladder   CHF (congestive heart failure) (HCC)    Patient denies   Chicken pox    Hyperlipidemia with target LDL less than 70 10/19/2015   Hypertension    MI, old    Obesity (BMI 30-39.9) 10/19/2015   OSA (obstructive sleep apnea) 10/19/2015   Pre-diabetes    Sleep apnea     Past Surgical History:  Procedure Laterality Date   CARDIAC CATHETERIZATION  2015   LHC w/ BMS x2 LCx and LAD  done in [place]    COLONOSCOPY     x1   EYE SURGERY Right 2014   Laser repair   done at [facility]   KNEE SURGERY Right 2000   torn Meniscus repair   done in [place]    NASAL SINUS SURGERY     ORIF ANKLE FRACTURE Right 09/20/2021   Procedure: OPEN REDUCTION INTERNAL FIXATION (ORIF) ANKLE FRACTURE;  Surgeon: Shari Sieving, MD;  Location: [facility];  Service: Orthopedics;  Laterality: Right;    Home Medications:    Allergies:  Allergies  Allergen Reactions   Penicillins Itching and Rash    Family History  Problem Relation Age of Onset   Hyperlipidemia Mother    Leukemia Father    Arthritis Father    Heart attack Maternal Grandfather    Heart disease Maternal Grandfather    Hyperlipidemia Maternal Grandfather    Stroke Maternal Grandfather    Hyperlipidemia Maternal Grandmother    Colon cancer Neg Hx    Liver cancer Neg Hx    Stomach cancer Neg Hx    Rectal cancer Neg Hx    Esophageal cancer Neg Hx     Social History:  reports that he quit smoking about 35 years ago. His smoking use included cigarettes. He has never used smokeless  tobacco. He reports current alcohol use. He reports that he does not use drugs.  ROS: A complete review of systems was performed.  All systems are negative except for pertinent findings as noted.  Physical Exam:  Vital signs in last 24 hours: BP (!) 148/80 (BP Location: Right Arm)   Pulse 68   Temp 98.8 F (37.1 C) (Oral)   Resp 20   SpO2 95%  Constitutional:  Alert and oriented, No acute distress Cardiovascular: Regular rate  Respiratory: Normal respiratory effort Neurologic: Grossly intact, no focal deficits Psychiatric: Normal mood and affect  I have reviewed prior pt notes  I have reviewed notes from referring/previous physicians  I have reviewed urinalysis results  I have independently reviewed prior imaging    Impression/Assessment:  Lg Rt-sided TCCa of bladder  Plan:  TURBT, gemcitabine  administration

## 2024-03-07 NOTE — Op Note (Signed)
 Preoperative diagnosis: Right lateral wall bladder tumor, 5.4 cm  Postoperative diagnosis: Same  Principal procedure: Cystoscopy, transurethral resection of bladder tumor greater than 5 cm, placement of intravesical gemcitabine   Surgeon: Alleyne Lac  Anesthesia: General Endotracheal  Estimated blood loss: Less than 50 mL  Specimen: Bladder tumor, for permanent specimen  Drains: None  Indications: 63 year old male with recently diagnosed bladder tumor.  On CT scan this measured 5.4 cm.  Cystoscopically there was a large tumor with low-lying papillary lesions around the major tumor.  He presents at this time for TURBT as well as placement of intravesical gemcitabine .  I discussed the procedure with the patient, risk, complications as well as expected outcomes.  He understands these and desires to proceed.  Findings: Urethra was normal.  There was mild bilobar hyperplasia with minimal obstruction.  Ureteral orifices were normal in location and configuration.  All abnormalities were confined to the right lateral wall and bladder neck area.  There was a large papillary tumor with calcifications.  The base of this was approximately 2 to 3 cm in size.  There were low-lying papillary lesions around the base.  There was a small diverticulum just superior to the right ureteral orifice.  Description of procedure: Patient was properly identified in the holding area.  He was taken to the operating room where general endotracheal anesthetic was administered.  He was placed in the dorsolithotomy position.  Genitalia and perineum were prepped and draped.  IV antibiotics were administered.  Proper timeout was performed.  The 26 French resectoscope sheath was passed using the visual obturator.  The above-mentioned findings were noted upon systematic inspection.  The resectoscope element and bipolar cutting loop were then placed.  The tumor was well away from the right ureteral orifice.  The large papillary tumor  was resected down into the muscular layer.  There was a large area of low-lying papillary lesions around the base.  These were resected as well.  Tumor fragments were irrigated from the bladder.  Repeat inspection revealed 2-3 small papillary lesions that were resected as well.  The entire resected area was cauterized.  With the irrigation off, there was no evident bleeding at this point.  All tumor fragments were irrigated from the bladder and sent for permanent specimen labeled bladder tumor.  Repeat inspection of the resected area revealed immaculate hemostasis.  At this point, the scope was removed.  I placed a 20 [place], 10 cc balloon Foley catheter.  This was hooked to dependent drainage.  At this point, the patient was awakened, extubated.  He was taken to the PACU in stable condition.  In the PACU, I placed 2 g of gemcitabine  and diluent .  The catheter was plugged at this point.  The gemcitabine  was left indwelling for 1 hour and then drained.

## 2024-03-07 NOTE — Interval H&P Note (Signed)
 History and Physical Interval Note:  03/07/2024 8:57 AM  Peter Oconnor  has presented today for surgery, with the diagnosis of Bladder Mass.  The various methods of treatment have been discussed with the patient and family. After consideration of risks, benefits and other options for treatment, the patient has consented to  Procedure(s) with comments: TURBT, WITH CHEMOTHERAPEUTIC AGENT INSTILLATION INTO BLADDER (N/A) - gemcitabine  INSTILLATION, BLADDER (N/A) as a surgical intervention.  The patient's history has been reviewed, patient examined, no change in status, stable for surgery.  I have reviewed the patient's chart and labs.  Questions were answered to the patient's satisfaction.     Garnette HERO [name]

## 2024-03-07 NOTE — Anesthesia Preprocedure Evaluation (Addendum)
 Anesthesia Evaluation  Patient identified by MRN, date of birth, ID band Patient awake    Reviewed: Allergy & Precautions, NPO status , Patient's Chart, lab work & pertinent test results  History of Anesthesia Complications Negative for: history of anesthetic complications  Airway Mallampati: II  TM Distance: >3 FB Neck ROM: Full    Dental  (+) Dental Advisory Given   Pulmonary sleep apnea and Continuous Positive Airway Pressure Ventilation , former smoker   breath sounds clear to auscultation       Cardiovascular hypertension, Pt. on medications + CAD, + Past MI and + Cardiac Stents   Rhythm:Regular Rate:Normal + Systolic murmurs 1/77/7974 Cardiology: The patient is at acceptable risk for the planned procedure.  He has a high functional level and no unstable symptoms.  He is not going for high risk procedure.  No further cardiovascular testing is indicated prior to surgery according to ACC/AHA guidelines.  2015 stress ECHO: normal, EF 60-65%   Neuro/Psych negative neurological ROS     GI/Hepatic negative GI ROS, Neg liver ROS,,,  Endo/Other  BMI 38  Renal/GU negative Renal ROS     Musculoskeletal  (+) Arthritis ,    Abdominal   Peds  Hematology Hb 14.3, plt 187k   Anesthesia Other Findings   Reproductive/Obstetrics                              Anesthesia Physical Anesthesia Plan  ASA: 3  Anesthesia Plan: General   Post-op Pain Management: Tylenol  PO (pre-op)*   Induction: Intravenous  PONV Risk Score and Plan: 2 and Ondansetron  and Dexamethasone   Airway Management Planned: Oral ETT  Additional Equipment: None  Intra-op Plan:   Post-operative Plan: Extubation in OR  Informed Consent: I have reviewed the patients History and Physical, chart, labs and discussed the procedure including the risks, benefits and alternatives for the proposed anesthesia with the patient or  authorized representative who has indicated his/her understanding and acceptance.     Dental advisory given  Plan Discussed with: CRNA and Surgeon  Anesthesia Plan Comments:          Anesthesia Quick Evaluation

## 2024-03-08 ENCOUNTER — Encounter: Admitting: Urology

## 2024-03-08 ENCOUNTER — Encounter (HOSPITAL_COMMUNITY): Payer: Self-pay | Admitting: Urology

## 2024-03-08 LAB — SURGICAL PATHOLOGY

## 2024-03-09 ENCOUNTER — Ambulatory Visit: Admitting: Family Medicine

## 2024-03-11 ENCOUNTER — Ambulatory Visit: Payer: Self-pay | Admitting: Urology

## 2024-03-16 ENCOUNTER — Other Ambulatory Visit: Payer: Self-pay | Admitting: Urology

## 2024-03-16 DIAGNOSIS — C672 Malignant neoplasm of lateral wall of bladder: Secondary | ICD-10-CM

## 2024-03-17 DIAGNOSIS — C679 Malignant neoplasm of bladder, unspecified: Secondary | ICD-10-CM | POA: Insufficient documentation

## 2024-03-17 NOTE — Progress Notes (Unsigned)
 [facility] CONSULT NOTE  Patient Care Team: Pcp, No as PCP - General Peter Wilbert SAUNDERS, MD as PCP - Cardiology (Cardiology)  ASSESSMENT & PLAN:  Peter Oconnor is a 63 y.o.male with history of tobacco abuse, HTN, CAD, MI, hyperlipidemia and sleep apnea  being [facility] bladder cancer.  Discussed neoadjuvant chemoimmunotherapy is considered standard of care urothelial carcinoma.  We discussed the regarding neoadjuvant therapy based on [place] trial. In this phase 3 trial, cisplatin-eligible patients with muscle-invasive bladder cancer received neoadjuvant durvalumab plus gemcitabine -cisplatin every 3 weeks for four cycles, followed by radical cystectomy and adjuvant durvalumab every 4 weeks for eight cycles (durvalumab group), or to receive neoadjuvant gemcitabine -cisplatin followed by radical cystectomy alone.  The estimated event-free survival at 24 months was 67.8% in the durvalumab group and 59.8% in the comparison group (hazard ratio for progression, recurrence, not undergoing radical cystectomy, or death from any cause, 0.68; 95% CI, 0.56 to 0.82; P<0.001 by stratified log-rank test). The estimated overall survival at 24 months was 82.2% in the durvalumab group and 75.2% in the comparison group. NEJM 2024.  We discussed that he has aggressive rare variant histology.  The data is not entirely applicable in his scenario.  There are some patients with mixed histology in the study.  Alternatively, upfront cystectomy followed by adjuvant treatment based on pathology can be considered.  He has appointment next week to evaluate for cystectomy with neobladder.  He will follow-up to obtain recommendations there.  Given the rare variant aggressive histology, I recommend staging study.  Will obtain PET/CT.  If there is metastatic disease, then local therapy is not amenable.  Patient will return after staging testing next week.  Assessment & Plan Malignant neoplasm of  urinary bladder, unspecified site (HCC) Rare variant of SCC, giant cell, and poorly differentiated histology. Will obtain PET/CT Follow-up with surgical oncology as scheduled next Tuesday Follow-up with me after PET scan in 1 week to go over the results  Orders Placed This Encounter  Procedures   NM PET Image Initial (PI) Skull Base To Thigh    Standing Status:   Future    Expected Date:   03/24/2024    Expiration Date:   03/18/2025    If indicated for the ordered procedure, I authorize the administration of a radiopharmaceutical per Radiology protocol:   Yes    Preferred imaging location?:   [facility]   CBC with Differential (Cancer Center Only)    Standing Status:   Future    Number of Occurrences:   1    Expiration Date:   03/18/2025   CMP (Cancer Center only)    Standing Status:   Future    Number of Occurrences:   1    Expiration Date:   03/18/2025   All questions were answered. The patient knows to call the clinic with any problems, questions or concerns. No barriers to learning was detected.  [name], MD 9/5/202512:40 PM  CHIEF COMPLAINTS/PURPOSE OF CONSULTATION:  MIBC  HISTORY OF PRESENTING ILLNESS:  Peter Oconnor 63 y.o. male is here because of bladder cancer I have reviewed his chart and materials related to his cancer extensively and collaborated history with the patient. Summary of oncologic history is as follows:  Patient report of hematuria about beginning of July, which led to urologic evaluation.  Imaging showed bladder mass.  Cystoscopy showed bladder mass and patient underwent TURBT.  He is otherwise feeling well.  There is no cardiovascular limitations.  He has no signs of heart failure.  He has been working with trainer and has been working out with good functional status.  Oncology History  Bladder cancer (HCC)  02/11/2024 Imaging   CT hematuria study 1. Contrast enhancing endoluminal mass of the right aspect of the urinary bladder measuring 5.4 x 2.9  cm with scattered associated calcifications and retraction of the bladder wall well as some adjacent fat stranding. 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  2. No evidence of lymphadenopathy or metastatic disease in the abdomen or pelvis. 3. Punctuate nonobstructive calculus of the midportion of the right kidney. No left-sided calculi, ureteral calculi, or hydronephrosis. 4. Hepatomegaly and hepatic steatosis. 5. Coronary artery disease.   03/07/2024 Pathology Results   Pathology A. BLADDER TUMOR, TURBT:  Invasive High-grade urothelial carcinoma with giant cells (20%), poorly  differentiated cells (10%) and squamous cell (30%) components.  The carcinoma invades muscularis propria (detrusor muscle)    03/17/2024 Initial Diagnosis   Bladder cancer [facility])     MEDICAL HISTORY:  Past Medical History:  Diagnosis Date   Allergy    Arthritis    CAD (coronary artery disease), native coronary artery 01/25/2014   S/p anterior MI with 90% LAD and occluded LCx s/p PCI of LAD and LCx with residual 30-40% stenosis of the RCA and normal LVF with lateral AK.     Cancer [facility])    Bladder   CHF (congestive heart failure) (HCC)    Patient denies   Chicken pox    Hyperlipidemia with target LDL less than 70 10/19/2015   Hypertension    MI, old    Obesity (BMI 30-39.9) 10/19/2015   OSA (obstructive sleep apnea) 10/19/2015   Pre-diabetes    Sleep apnea     SURGICAL HISTORY: Past Surgical History:  Procedure Laterality Date   BLADDER INSTILLATION N/A 03/07/2024   Procedure: INSTILLATION, BLADDER;  Surgeon: Matilda Senior, MD;  Location: [facility];  Service: Urology;  Laterality: N/A;   CARDIAC CATHETERIZATION  2015   LHC w/ BMS x2 LCx and LAD  done in [place]    COLONOSCOPY     x1   EYE SURGERY Right 2014   Laser repair   done at [facility]   KNEE SURGERY Right 2000   torn Meniscus repair   done in [place]    NASAL SINUS SURGERY     ORIF ANKLE FRACTURE Right 09/20/2021    Procedure: OPEN REDUCTION INTERNAL FIXATION (ORIF) ANKLE FRACTURE;  Surgeon: Shari Sieving, MD;  Location: [facility];  Service: Orthopedics;  Laterality: Right;    SOCIAL HISTORY: Social History   Socioeconomic History   Marital status: Married    Spouse name: Not on file   Number of children: 3   Years of education: Not on file   Highest education level: Not on file  Occupational History   Occupation: self employed  Tobacco Use   Smoking status: Former    Current packs/day: 0.00    Types: Cigarettes    Quit date: 1990    Years since quitting: 35.7   Smokeless tobacco: Never  Vaping Use   Vaping status: Never Used  Substance and Sexual Activity   Alcohol use: Yes    Comment: occasionally   Drug use: No   Sexual activity: Yes  Other Topics Concern   Not on file  Social History Narrative   Not on file   Social Drivers of Health   Financial Resource Strain: Not on file  Food Insecurity: No Food Insecurity (03/18/2024)  Hunger Vital Sign    Worried About Running Out of Food in the Last Year: Never true    Ran Out of Food in the Last Year: Never true  Transportation Needs: No Transportation Needs (03/18/2024)   PRAPARE - Administrator, Civil Service (Medical): No    Lack of Transportation (Non-Medical): No  Physical Activity: Not on file  Stress: Not on file  Social Connections: Not on file  Intimate Partner Violence: Not on file    FAMILY HISTORY: Family History  Problem Relation Age of Onset   Hyperlipidemia Mother    Leukemia Father    Arthritis Father    Heart attack Maternal Grandfather    Heart disease Maternal Grandfather    Hyperlipidemia Maternal Grandfather    Stroke Maternal Grandfather    Hyperlipidemia Maternal Grandmother    Colon cancer Neg Hx    Liver cancer Neg Hx    Stomach cancer Neg Hx    Rectal cancer Neg Hx    Esophageal cancer Neg Hx     ALLERGIES:  is allergic to penicillins.  MEDICATIONS:  Current Outpatient  Medications  Medication Sig Dispense Refill   ibuprofen  (ADVIL ) 200 MG tablet Take 400 mg by mouth every 8 (eight) hours as needed (pain.).     lisinopril  (ZESTRIL ) 5 MG tablet Take 1 tablet (5 mg total) by mouth daily. 90 tablet 3   oxyCODONE  (ROXICODONE ) 5 MG immediate release tablet Take 1 tablet (5 mg total) by mouth every 4 (four) hours as needed for severe pain (pain score 7-10). 10 tablet 0   rosuvastatin  (CRESTOR ) 40 MG tablet TAKE 1 TABLET BY MOUTH DAILY 90 tablet 3   Collagen-Vitamin C-Biotin (COLLAGEN PO) Take 2 Doses by mouth daily. (Patient not taking: Reported on 03/18/2024)     Current Facility-Administered Medications  Medication Dose Route Frequency Provider Last Rate Last Admin   0.9 %  sodium chloride  infusion  500 mL Intravenous Once Beavers, Kimberly, MD        REVIEW OF SYSTEMS:   All relevant systems were reviewed with the patient and are negative.  PHYSICAL EXAMINATION: ECOG PERFORMANCE STATUS: 0 - Asymptomatic  Vitals:   03/18/24 0949  BP: 109/61  Pulse: 66  Resp: 17  Temp: (!) 97.5 F (36.4 C)  SpO2: 97%   Filed Weights   03/18/24 0949  Weight: 272 lb (123.4 kg)    GENERAL: alert, no distress and comfortable SKIN: skin color is normal, no jaundice EYES: sclera clear LYMPH:  no palpable lymphadenopathy in the cervical, axillary regions LUNGS: Effort normal, no respiratory distress.  Clear to auscultation bilaterally HEART: regular rate & rhythm and no lower extremity edema ABDOMEN: soft, non-tender and nondistended  LABORATORY DATA:  I have reviewed the data as listed Lab Results  Component Value Date   WBC 6.8 03/18/2024   HGB 12.9 (L) 03/18/2024   HCT 38.1 (L) 03/18/2024   MCV 91.1 03/18/2024   PLT 175 03/18/2024   Recent Labs    10/14/23 0946 01/21/24 1346 03/01/24 0923 03/18/24 1115  NA 143 135 139 141  K 5.2 4.8 4.3 4.7  CL 104 101 105 107  CO2 24 25 24 31   GLUCOSE 111* 109* 106* 106*  BUN 15 18 11 15   CREATININE 0.95 0.91  0.86 0.78  CALCIUM  10.0 9.9 9.2 9.8  GFRNONAA  --  >60 >60 >60  PROT 7.0 7.8  --  7.4  ALBUMIN 4.6 4.6  --  4.6  AST 25 31  --  17  ALT 22 30  --  19  ALKPHOS 57 42  --  41  BILITOT 0.5 1.0  --  0.5    RADIOGRAPHIC STUDIES: I have personally reviewed the radiological images as listed and agreed with the findings in the report.

## 2024-03-17 NOTE — Assessment & Plan Note (Signed)
 Rare variant of more SCC, then giant cell, and poorly differentiated histology.

## 2024-03-18 ENCOUNTER — Inpatient Hospital Stay

## 2024-03-18 VITALS — BP 109/61 | HR 66 | Temp 97.5°F | Resp 17 | Ht 71.0 in | Wt 272.0 lb

## 2024-03-18 DIAGNOSIS — Z79899 Other long term (current) drug therapy: Secondary | ICD-10-CM | POA: Insufficient documentation

## 2024-03-18 DIAGNOSIS — I2510 Atherosclerotic heart disease of native coronary artery without angina pectoris: Secondary | ICD-10-CM | POA: Diagnosis not present

## 2024-03-18 DIAGNOSIS — I252 Old myocardial infarction: Secondary | ICD-10-CM | POA: Insufficient documentation

## 2024-03-18 DIAGNOSIS — K760 Fatty (change of) liver, not elsewhere classified: Secondary | ICD-10-CM | POA: Diagnosis not present

## 2024-03-18 DIAGNOSIS — C679 Malignant neoplasm of bladder, unspecified: Secondary | ICD-10-CM | POA: Diagnosis present

## 2024-03-18 LAB — CBC WITH DIFFERENTIAL (CANCER CENTER ONLY)
Abs Immature Granulocytes: 0.03 K/uL (ref 0.00–0.07)
Basophils Absolute: 0 K/uL (ref 0.0–0.1)
Basophils Relative: 1 %
Eosinophils Absolute: 0.2 K/uL (ref 0.0–0.5)
Eosinophils Relative: 2 %
HCT: 38.1 % — ABNORMAL LOW (ref 39.0–52.0)
Hemoglobin: 12.9 g/dL — ABNORMAL LOW (ref 13.0–17.0)
Immature Granulocytes: 0 %
Lymphocytes Relative: 23 %
Lymphs Abs: 1.6 K/uL (ref 0.7–4.0)
MCH: 30.9 pg (ref 26.0–34.0)
MCHC: 33.9 g/dL (ref 30.0–36.0)
MCV: 91.1 fL (ref 80.0–100.0)
Monocytes Absolute: 0.5 K/uL (ref 0.1–1.0)
Monocytes Relative: 8 %
Neutro Abs: 4.5 K/uL (ref 1.7–7.7)
Neutrophils Relative %: 66 %
Platelet Count: 175 K/uL (ref 150–400)
RBC: 4.18 MIL/uL — ABNORMAL LOW (ref 4.22–5.81)
RDW: 12.7 % (ref 11.5–15.5)
WBC Count: 6.8 K/uL (ref 4.0–10.5)
nRBC: 0 % (ref 0.0–0.2)

## 2024-03-18 LAB — CMP (CANCER CENTER ONLY)
ALT: 19 U/L (ref 0–44)
AST: 17 U/L (ref 15–41)
Albumin: 4.6 g/dL (ref 3.5–5.0)
Alkaline Phosphatase: 41 U/L (ref 38–126)
Anion gap: 3 — ABNORMAL LOW (ref 5–15)
BUN: 15 mg/dL (ref 6–20)
CO2: 31 mmol/L (ref 22–32)
Calcium: 9.8 mg/dL (ref 8.9–10.3)
Chloride: 107 mmol/L (ref 98–111)
Creatinine: 0.78 mg/dL (ref 0.61–1.24)
GFR, Estimated: >60 mL/min (ref >60)
Glucose, Bld: 106 mg/dL — ABNORMAL HIGH (ref 70–99)
Potassium: 4.7 mmol/L (ref 3.5–5.1)
Sodium: 141 mmol/L (ref 135–145)
Total Bilirubin: 0.5 mg/dL (ref 0.0–1.2)
Total Protein: 7.4 g/dL (ref 6.5–8.1)

## 2024-03-18 NOTE — Patient Instructions (Addendum)
 Cisplatin Injection What is this medication? CISPLATIN (SIS pla tin) treats some types of cancer. It works by slowing down the growth of cancer cells. This medicine may be used for other purposes; ask your health care provider or pharmacist if you have questions. COMMON BRAND NAME(S): Platinol, Platinol -AQ What should I tell my care team before I take this medication? They need to know if you have any of these conditions: Eye disease, vision problems Hearing problems Kidney disease Low blood counts, such as low white cells, platelets, or red blood cells Tingling of the fingers or toes, or other nerve disorder An unusual or allergic reaction to cisplatin, carboplatin, oxaliplatin, other medications, foods, dyes, or preservatives If you or your partner are pregnant or trying to get pregnant Breast-feeding How should I use this medication? This medication is injected into a vein. It is given by your care team in a hospital or clinic setting. Talk to your care team about the use of this medication in children. Special care may be needed. Overdosage: If you think you have taken too much of this medicine contact a poison control center or emergency room at once. NOTE: This medicine is only for you. Do not share this medicine with others. What if I miss a dose? Keep appointments for follow-up doses. It is important not to miss your dose. Call your care team if you are unable to keep an appointment. What may interact with this medication? Do not take this medication with any of the following: Live virus vaccines This medication may also interact with the following: Certain antibiotics, such as amikacin, gentamicin, neomycin, polymyxin B, streptomycin, tobramycin, vancomycin  Foscarnet This list may not describe all possible interactions. Give your health care provider a list of all the medicines, herbs, non-prescription drugs, or dietary supplements you use. Also tell them if you smoke, drink  alcohol, or use illegal drugs. Some items may interact with your medicine. What should I watch for while using this medication? Your condition will be monitored carefully while you are receiving this medication. You may need blood work done while taking this medication. This medication may make you feel generally unwell. This is not uncommon, as chemotherapy can affect healthy cells as well as cancer cells. Report any side effects. Continue your course of treatment even though you feel ill unless your care team tells you to stop. This medication may increase your risk of getting an infection. Call your care team for advice if you get a fever, chills, sore throat, or other symptoms of a cold or flu. Do not treat yourself. Try to avoid being around people who are sick. Avoid taking medications that contain aspirin , acetaminophen , ibuprofen , naproxen, or ketoprofen unless instructed by your care team. These medications may hide a fever. This medication may increase your risk to bruise or bleed. Call your care team if you notice any unusual bleeding. Be careful brushing or flossing your teeth or using a toothpick because you may get an infection or bleed more easily. If you have any dental work done, tell your dentist you are receiving this medication. Drink fluids as directed while you are taking this medication. This will help protect your kidneys. Call your care team if you get diarrhea. Do not treat yourself. Talk to your care team if you or your partner wish to become pregnant or think you might be pregnant. This medication can cause serious birth defects if taken during pregnancy and for 14 months after the last dose. A negative pregnancy  test is required before starting this medication. A reliable form of contraception is recommended while taking this medication and for 14 months after the last dose. Talk to your care team about effective forms of contraception. Do not father a child while taking this  medication and for 11 months after the last dose. Use a condom during sex during this time period. Do not breast-feed while taking this medication. This medication may cause infertility. Talk to your care team if you are concerned about your fertility. What side effects may I notice from receiving this medication? Side effects that you should report to your care team as soon as possible: Allergic reactions--skin rash, itching, hives, swelling of the face, lips, tongue, or throat Eye pain, change in vision, vision loss Hearing loss, ringing in ears Infection--fever, chills, cough, sore throat, wounds that don't heal, pain or trouble when passing urine, general feeling of discomfort or being unwell Kidney injury--decrease in the amount of urine, swelling of the ankles, hands, or feet Low red blood cell level--unusual weakness or fatigue, dizziness, headache, trouble breathing Painful swelling, warmth, or redness of the skin, blisters or sores at the infusion site Pain, tingling, or numbness in the hands or feet Unusual bruising or bleeding Side effects that usually do not require medical attention (report to your care team if they continue or are bothersome): Hair loss Nausea Vomiting This list may not describe all possible side effects. Call your doctor for medical advice about side effects. You may report side effects to FDA at 1-800-FDA-1088. Where should I keep my medication? This medication is given in a hospital or clinic. It will not be stored at home. NOTE: This sheet is a summary. It may not cover all possible information. If you have questions about this medicine, talk to your doctor, pharmacist, or health care provider.  2024 Elsevier/Gold Standard (2021-11-01 00:00:00)  Gemcitabine  Injection What is this medication? GEMCITABINE  (jem SYE ta been) treats some types of cancer. It works by slowing down the growth of cancer cells. This medicine may be used for other purposes; ask  your health care provider or pharmacist if you have questions. COMMON BRAND NAME(S): Gemzar , Infugem  What should I tell my care team before I take this medication? They need to know if you have any of these conditions: Blood disorders Infection Kidney disease Liver disease Lung or breathing disease, such as asthma or COPD Recent or ongoing radiation therapy An unusual or allergic reaction to gemcitabine , other medications, foods, dyes, or preservatives If you or your partner are pregnant or trying to get pregnant Breast-feeding How should I use this medication? This medication is injected into a vein. It is given by your care team in a hospital or clinic setting. Talk to your care team about the use of this medication in children. Special care may be needed. Overdosage: If you think you have taken too much of this medicine contact a poison control center or emergency room at once. NOTE: This medicine is only for you. Do not share this medicine with others. What if I miss a dose? Keep appointments for follow-up doses. It is important not to miss your dose. Call your care team if you are unable to keep an appointment. What may interact with this medication? Interactions have not been studied. This list may not describe all possible interactions. Give your health care provider a list of all the medicines, herbs, non-prescription drugs, or dietary supplements you use. Also tell them if you smoke, drink alcohol, or  use illegal drugs. Some items may interact with your medicine. What should I watch for while using this medication? Your condition will be monitored carefully while you are receiving this medication. This medication may make you feel generally unwell. This is not uncommon, as chemotherapy can affect healthy cells as well as cancer cells. Report any side effects. Continue your course of treatment even though you feel ill unless your care team tells you to stop. In some cases, you may  be given additional medications to help with side effects. Follow all directions for their use. This medication may increase your risk of getting an infection. Call your care team for advice if you get a fever, chills, sore throat, or other symptoms of a cold or flu. Do not treat yourself. Try to avoid being around people who are sick. This medication may increase your risk to bruise or bleed. Call your care team if you notice any unusual bleeding. Be careful brushing or flossing your teeth or using a toothpick because you may get an infection or bleed more easily. If you have any dental work done, tell your dentist you are receiving this medication. Avoid taking medications that contain aspirin , acetaminophen , ibuprofen , naproxen, or ketoprofen unless instructed by your care team. These medications may hide a fever. Talk to your care team if you or your partner wish to become pregnant or think you might be pregnant. This medication can cause serious birth defects if taken during pregnancy and for 6 months after the last dose. A negative pregnancy test is required before starting this medication. A reliable form of contraception is recommended while taking this medication and for 6 months after the last dose. Talk to your care team about effective forms of contraception. Do not father a child while taking this medication and for 3 months after the last dose. Use a condom while having sex during this time period. Do not breastfeed while taking this medication and for at least 1 week after the last dose. This medication may cause infertility. Talk to your care team if you are concerned about your fertility. What side effects may I notice from receiving this medication? Side effects that you should report to your care team as soon as possible: Allergic reactions--skin rash, itching, hives, swelling of the face, lips, tongue, or throat Capillary leak syndrome--stomach or muscle pain, unusual weakness or  fatigue, feeling faint or lightheaded, decrease in the amount of urine, swelling of the ankles, hands, or feet, trouble breathing Infection--fever, chills, cough, sore throat, wounds that don't heal, pain or trouble when passing urine, general feeling of discomfort or being unwell Liver injury--right upper belly pain, loss of appetite, nausea, light-colored stool, dark yellow or brown urine, yellowing skin or eyes, unusual weakness or fatigue Low red blood cell level--unusual weakness or fatigue, dizziness, headache, trouble breathing Lung injury--shortness of breath or trouble breathing, cough, spitting up blood, chest pain, fever Stomach pain, bloody diarrhea, pale skin, unusual weakness or fatigue, decrease in the amount of urine, which may be signs of hemolytic uremic syndrome Sudden and severe headache, confusion, change in vision, seizures, which may be signs of posterior reversible encephalopathy syndrome (PRES) Unusual bruising or bleeding Side effects that usually do not require medical attention (report to your care team if they continue or are bothersome): Diarrhea Drowsiness Hair loss Nausea Pain, redness, or swelling with sores inside the mouth or throat Vomiting This list may not describe all possible side effects. Call your doctor for medical advice about side  effects. You may report side effects to FDA at 1-800-FDA-1088. Where should I keep my medication? This medication is given in a hospital or clinic. It will not be stored at home. NOTE: This sheet is a summary. It may not cover all possible information. If you have questions about this medicine, talk to your doctor, pharmacist, or health care provider.  2024 Elsevier/Gold Standard (2021-11-05 00:00:00)  Durvalumab Injection What is this medication? DURVALUMAB (dur VAL ue mab) treats some types of cancer. It works by helping your immune system slow or stop the spread of cancer cells. It is a monoclonal antibody. This  medicine may be used for other purposes; ask your health care provider or pharmacist if you have questions. COMMON BRAND NAME(S): IMFINZI What should I tell my care team before I take this medication? They need to know if you have any of these conditions: Allogeneic stem cell transplant (uses someone else's stem cells) Autoimmune diseases, such as Crohn disease, ulcerative colitis, lupus History of chest radiation Nervous system problems, such as Guillain-Barre syndrome, myasthenia gravis Organ transplant An unusual or allergic reaction to durvalumab, other medications, foods, dyes, or preservatives Pregnant or trying to get pregnant Breast-feeding How should I use this medication? This medication is infused into a vein. It is given by your care team in a hospital or clinic setting. A special MedGuide will be given to you before each treatment. Be sure to read this information carefully each time. Talk to your care team about the use of this medication in children. Special care may be needed. Overdosage: If you think you have taken too much of this medicine contact a poison control center or emergency room at once. NOTE: This medicine is only for you. Do not share this medicine with others. What if I miss a dose? Keep appointments for follow-up doses. It is important not to miss your dose. Call your care team if you are unable to keep an appointment. What may interact with this medication? Interactions have not been studied. This list may not describe all possible interactions. Give your health care provider a list of all the medicines, herbs, non-prescription drugs, or dietary supplements you use. Also tell them if you smoke, drink alcohol, or use illegal drugs. Some items may interact with your medicine. What should I watch for while using this medication? Your condition will be monitored carefully while you are receiving this medication. You may need blood work while taking this  medication. This medication may cause serious skin reactions. They can happen weeks to months after starting the medication. Contact your care team right away if you notice fevers or flu-like symptoms with a rash. The rash may be red or purple and then turn into blisters or peeling of the skin. You may also notice a red rash with swelling of the face, lips, or lymph nodes in your neck or under your arms. Tell your care team right away if you have any change in your eyesight. Talk to your care team if you may be pregnant. Serious birth defects can occur if you take this medication during pregnancy and for 3 months after the last dose. You will need a negative pregnancy test before starting this medication. Contraception is recommended while taking this medication and for 3 months after the last dose. Your care team can help you find the option that works for you. Do not breastfeed while taking this medication and for 3 months after the last dose. What side effects may I notice  from receiving this medication? Side effects that you should report to your care team as soon as possible: Allergic reactions--skin rash, itching, hives, swelling of the face, lips, tongue, or throat Dry cough, shortness of breath or trouble breathing Eye pain, redness, irritation, or discharge with blurry or decreased vision Heart muscle inflammation--unusual weakness or fatigue, shortness of breath, chest pain, fast or irregular heartbeat, dizziness, swelling of the ankles, feet, or hands Hormone gland problems--headache, sensitivity to light, unusual weakness or fatigue, dizziness, fast or irregular heartbeat, increased sensitivity to cold or heat, excessive sweating, constipation, hair loss, increased thirst or amount of urine, tremors or shaking, irritability Infusion reactions--chest pain, shortness of breath or trouble breathing, feeling faint or lightheaded Kidney injury (glomerulonephritis)--decrease in the amount of  urine, red or dark brown urine, foamy or bubbly urine, swelling of the ankles, hands, or feet Liver injury--right upper belly pain, loss of appetite, nausea, light-colored stool, dark yellow or brown urine, yellowing skin or eyes, unusual weakness or fatigue Pain, tingling, or numbness in the hands or feet, muscle weakness, change in vision, confusion or trouble speaking, loss of balance or coordination, trouble walking, seizures Rash, fever, and swollen lymph nodes Redness, blistering, peeling, or loosening of the skin, including inside the mouth Sudden or severe stomach pain, bloody diarrhea, fever, nausea, vomiting Side effects that usually do not require medical attention (report these to your care team if they continue or are bothersome): Bone, joint, or muscle pain Diarrhea Fatigue Loss of appetite Nausea Skin rash This list may not describe all possible side effects. Call your doctor for medical advice about side effects. You may report side effects to FDA at 1-800-FDA-1088. Where should I keep my medication? This medication is given in a hospital or clinic. It will not be stored at home. NOTE: This sheet is a summary. It may not cover all possible information. If you have questions about this medicine, talk to your doctor, pharmacist, or health care provider.  2024 Elsevier/Gold Standard (2021-11-12 00:00:00)

## 2024-03-21 ENCOUNTER — Encounter: Admitting: Urology

## 2024-03-22 ENCOUNTER — Other Ambulatory Visit: Payer: Self-pay

## 2024-03-22 DIAGNOSIS — C679 Malignant neoplasm of bladder, unspecified: Secondary | ICD-10-CM

## 2024-03-22 NOTE — Progress Notes (Signed)
 History of Present Illness: Peter Oconnor is here today for postoperative check.  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  Gemcitabine  placed postoperatively.  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  He has recovered fairly well from his procedure.  Thus far, he has seen Dr. Tina in [facility] for consultation regarding possible neoadjuvant therapy for for cystectomy.  Also, he had a visit to the [facility] yesterday.  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     Cancer [facility])    Bladder   CHF (congestive heart failure) (HCC)    Patient denies   Chicken pox    Hyperlipidemia with target LDL less than 70 10/19/2015   Hypertension    MI, old    Obesity (BMI 30-39.9) 10/19/2015   OSA (obstructive sleep apnea) 10/19/2015   Pre-diabetes    Sleep apnea     Past Surgical History:  Procedure Laterality Date   BLADDER INSTILLATION N/A 03/07/2024   Procedure: INSTILLATION, BLADDER;  Surgeon: Matilda Senior, MD;  Location: [facility];  Service: Urology;  Laterality: N/A;   CARDIAC CATHETERIZATION  2015   LHC w/ BMS x2 LCx and LAD  done in [place]    COLONOSCOPY     x1   EYE SURGERY Right 2014   Laser repair   done at [facility]   KNEE SURGERY Right 2000   torn Meniscus repair   done in [place]    NASAL SINUS SURGERY     ORIF ANKLE FRACTURE Right 09/20/2021   Procedure: OPEN REDUCTION INTERNAL FIXATION (ORIF) ANKLE FRACTURE;  Surgeon: Shari Sieving, MD;  Location: [facility];  Service: Orthopedics;  Laterality: Right;    Home Medications:  Allergies as of 03/23/2024       Reactions   Penicillins Itching, Rash         Medication List        Accurate as of March 22, 2024  1:12 PM. If you have any questions, ask your nurse or doctor.          COLLAGEN PO Take 2 Doses by mouth daily.   ibuprofen  200 MG tablet Commonly known as: ADVIL  Take 400 mg by mouth every 8 (eight) hours as needed (pain.).   lisinopril  5 MG tablet Commonly known as: ZESTRIL  Take 1 tablet (5 mg total) by mouth daily.   oxyCODONE  5 MG immediate release tablet Commonly known as: Roxicodone  Take 1 tablet (5 mg total) by mouth every 4 (four) hours as needed for severe pain (pain score 7-10).   rosuvastatin  40 MG tablet Commonly known as: CRESTOR  TAKE 1 TABLET BY MOUTH DAILY        Allergies:  Allergies  Allergen Reactions   Penicillins Itching and Rash    Family History  Problem Relation Age of Onset   Hyperlipidemia Mother    Leukemia Father    Arthritis Father    Heart attack Maternal Grandfather    Heart disease Maternal Grandfather    Hyperlipidemia Maternal Grandfather    Stroke Maternal Grandfather    Hyperlipidemia Maternal Grandmother    Colon cancer Neg Hx    Liver cancer Neg Hx  Stomach cancer Neg Hx    Rectal cancer Neg Hx    Esophageal cancer Neg Hx     Social History:  reports that he quit smoking about 35 years ago. His smoking use included cigarettes. He has never used smokeless tobacco. He reports current alcohol use. He reports that he does not use drugs.  ROS: A complete review of systems was performed.  All systems are negative except for pertinent findings as noted.  Physical Exam:  Vital signs in last 24 hours: There were no vitals taken for this visit. Constitutional:  Alert and oriented, No acute distress Cardiovascular: Regular rate  Respiratory: Normal respiratory effort GI: Abdomen is soft, nontender, nondistended, no abdominal masses. No CVAT.  Genitourinary: Normal male phallus, testes are descended bilaterally and non-tender and without masses, scrotum  is normal in appearance without lesions or masses, perineum is normal on inspection. Lymphatic: No lymphadenopathy Neurologic: Grossly intact, no focal deficits Psychiatric: Normal mood and affect  I have reviewed prior pt notes  I have reviewed notes from referring/previous physicians  I have reviewed urinalysis results  I have independently reviewed prior imaging  I have reviewed prior PSA results  I have reviewed prior urine culture   Impression/Assessment:  ***  Plan:  ***

## 2024-03-22 NOTE — Progress Notes (Signed)
Port order placed.

## 2024-03-23 ENCOUNTER — Encounter
Admission: RE | Admit: 2024-03-23 | Discharge: 2024-03-23 | Disposition: A | Discharge status: Home / Self Care | Source: Ambulatory Visit

## 2024-03-23 ENCOUNTER — Encounter: Payer: Self-pay | Admitting: Urology

## 2024-03-23 ENCOUNTER — Ambulatory Visit (INDEPENDENT_AMBULATORY_CARE_PROVIDER_SITE_OTHER): Admitting: Urology

## 2024-03-23 VITALS — BP 148/94 | HR 71 | Ht 71.0 in | Wt 268.0 lb

## 2024-03-23 DIAGNOSIS — C672 Malignant neoplasm of lateral wall of bladder: Secondary | ICD-10-CM | POA: Diagnosis not present

## 2024-03-23 DIAGNOSIS — C679 Malignant neoplasm of bladder, unspecified: Secondary | ICD-10-CM | POA: Diagnosis present

## 2024-03-23 LAB — URINALYSIS, ROUTINE W REFLEX MICROSCOPIC
Bilirubin, UA: NEGATIVE
Glucose, UA: NEGATIVE
Ketones, UA: NEGATIVE
Nitrite, UA: NEGATIVE
Protein,UA: NEGATIVE
Specific Gravity, UA: 1.01 (ref 1.005–1.030)
Urobilinogen, Ur: 0.2 mg/dL (ref 0.2–1.0)
pH, UA: 7 (ref 5.0–7.5)

## 2024-03-23 LAB — GLUCOSE, CAPILLARY: Glucose-Capillary: 103 mg/dL — ABNORMAL HIGH (ref 70–99)

## 2024-03-23 LAB — MICROSCOPIC EXAMINATION

## 2024-03-23 MED ORDER — FLUDEOXYGLUCOSE F - 18 (FDG) INJECTION
12.0000 | Freq: Once | INTRAVENOUS | Status: AC | PRN
  Administered 2024-03-23: 12.8 via INTRAVENOUS

## 2024-03-24 ENCOUNTER — Ambulatory Visit: Payer: Self-pay

## 2024-03-25 ENCOUNTER — Inpatient Hospital Stay (HOSPITAL_BASED_OUTPATIENT_CLINIC_OR_DEPARTMENT_OTHER)

## 2024-03-25 ENCOUNTER — Encounter: Payer: Self-pay | Admitting: Cardiology

## 2024-03-25 ENCOUNTER — Encounter: Payer: Self-pay | Admitting: Family Medicine

## 2024-03-25 ENCOUNTER — Ambulatory Visit: Admitting: Family Medicine

## 2024-03-25 VITALS — BP 143/62 | HR 87 | Temp 98.1°F | Resp 16 | Wt 272.8 lb

## 2024-03-25 VITALS — BP 140/70 | HR 82 | Temp 98.2°F | Resp 18 | Ht 71.0 in | Wt 272.8 lb

## 2024-03-25 DIAGNOSIS — I10 Essential (primary) hypertension: Secondary | ICD-10-CM | POA: Diagnosis not present

## 2024-03-25 DIAGNOSIS — C679 Malignant neoplasm of bladder, unspecified: Secondary | ICD-10-CM

## 2024-03-25 DIAGNOSIS — E785 Hyperlipidemia, unspecified: Secondary | ICD-10-CM

## 2024-03-25 DIAGNOSIS — R7303 Prediabetes: Secondary | ICD-10-CM | POA: Insufficient documentation

## 2024-03-25 DIAGNOSIS — I2510 Atherosclerotic heart disease of native coronary artery without angina pectoris: Secondary | ICD-10-CM | POA: Diagnosis not present

## 2024-03-25 DIAGNOSIS — L989 Disorder of the skin and subcutaneous tissue, unspecified: Secondary | ICD-10-CM | POA: Insufficient documentation

## 2024-03-25 DIAGNOSIS — G629 Polyneuropathy, unspecified: Secondary | ICD-10-CM | POA: Insufficient documentation

## 2024-03-25 DIAGNOSIS — C673 Malignant neoplasm of anterior wall of bladder: Secondary | ICD-10-CM

## 2024-03-25 DIAGNOSIS — E669 Obesity, unspecified: Secondary | ICD-10-CM | POA: Diagnosis not present

## 2024-03-25 DIAGNOSIS — Z7689 Persons encountering health services in other specified circumstances: Secondary | ICD-10-CM

## 2024-03-25 NOTE — Progress Notes (Signed)
 [facility] OFFICE PROGRESS NOTE  Patient Care Team: Peter Beverley NOVAK, MD as PCP - General (Family Medicine) Peter Wilbert SAUNDERS, MD as PCP - Cardiology (Cardiology)  Peter Oconnor is a 63 y.o.male with history of tobacco abuse, HTN, CAD, MI, hyperlipidemia and sleep apnea  [facility].   Diagnosis: [id number]  Long discussion today. Discussed neoadjuvant chemoimmunotherapy is considered standard of care urothelial carcinoma.  The estimated event-free survival at 24 months was 67.8% in the durvalumab group and 59.8% in the comparison group.  The estimated overall survival at 24 months was 82.2% in the durvalumab group and 75.2% in the comparison group. NEJM 2024.   Discussed ddMVAC (methotrexate, vinblastine, doxorubicin, and cisplatin) followed by radical cystectomy.  Patient has normal kidney function can be the other option.  Discussed clinical evidence of neoadjuvant dose dense MVAC with GCSF versus other treatment. SWOG trial of over 300 and patient with muscle invasive disease randomized to radical cystectomy alone versus 3 cycles of neoadjuvant MVAC followed by cystectomy show improvement in median survival of 77 versus 46 months. ddMVAC improved complete response rate compared to standard MVAC from 11 to 25%.  Lower rate of residual disease at 15 versus 38%.  Other clinical trials report response rate of 38% and 10% on 2 different trials.  Discussed potential side effects, and schedules.  Patient will need chemotherapy teaching.  Discussed risk of anemia, fatigue, thrombocytopenia, neutropenia, risk of infection, sepsis.  Discussed potential risk of infection, mucositis, electrolyte abnormality from dehydration, neuropathy, hearing loss, renal insufficiency which can lead to other end organ damage, and rarely death and second cancer.  Discussed need for hospitalization, ED visit with neutropenic fever.  Any signs of infection should be taken  seriously.  Report to ED with fever.  Discussed that there is no head-to-head trial to compare both regimen.  We know that they both work in the neoadjuvant setting.  He has history of MI in the past.  After long discussion, we will request earlier echocardiogram to be completed.  If no concerning findings, patient may proceed with dose dense MVAC.  If signs of concerns from echocardiogram, will omit doxorubicin and proceed with GC+ Durvalumab.  Assessment & Plan Malignant neoplasm of urinary bladder, unspecified site [facility]) Will request earlier ECHO Plan on ddMVAC  Follow up with C1 Teaching appointment Plan on RC after NAC  I spent a total of 64 minutes including review of chart and various tests results, face-to-face time with the patient, discussions about results, plan of care and coordination of care plan with other providers and staff members, discussion of efficacy in different regimen and side effect and planning.  Orders Placed This Encounter  Procedures   Consent Attestation for Oncology Treatment    The patient is informed of risks, benefits, side-effects of the prescribed oncology treatment. Potential short term and long term side effects and response rates discussed. After a long discussion, the patient made informed decision to proceed.:   Yes   CBC with Differential (Cancer Center Only)    Standing Status:   Future    Expected Date:   04/18/2024    Expiration Date:   04/18/2025   CMP (Cancer Center only)    Standing Status:   Future    Expected Date:   04/18/2024    Expiration Date:   04/18/2025   Magnesium    Standing Status:   Future    Expected Date:   04/18/2024  Expiration Date:   04/18/2025   CBC with Differential (Cancer Center Only)    Standing Status:   Future    Expected Date:   05/02/2024    Expiration Date:   05/02/2025   CMP (Cancer Center only)    Standing Status:   Future    Expected Date:   05/02/2024    Expiration Date:   05/02/2025   Magnesium     Standing Status:   Future    Expected Date:   05/02/2024    Expiration Date:   05/02/2025   CBC with Differential (Cancer Center Only)    Standing Status:   Future    Expected Date:   05/16/2024    Expiration Date:   05/16/2025   CMP (Cancer Center only)    Standing Status:   Future    Expected Date:   05/16/2024    Expiration Date:   05/16/2025   Magnesium    Standing Status:   Future    Expected Date:   05/16/2024    Expiration Date:   05/16/2025   CBC with Differential (Cancer Center Only)    Standing Status:   Future    Expected Date:   05/30/2024    Expiration Date:   05/30/2025   CMP (Cancer Center only)    Standing Status:   Future    Expected Date:   05/30/2024    Expiration Date:   05/30/2025   Magnesium    Standing Status:   Future    Expected Date:   05/30/2024    Expiration Date:   05/30/2025   PHYSICIAN COMMUNICATION ORDER    A baseline Audiogram is recommended prior to initiation of cisplatin chemotherapy.   ONCBCN PHYSICIAN COMMUNICATION 5    A baseline Echo/ Muga should be obtained prior to initiation of Anthracycline Chemotherapy.     Peter JAYSON Chihuahua, MD  INTERVAL HISTORY: Patient returns for follow-up. Overall feeling well. He met with Uro onc and is a good surgical candidate.   Oncology History  Bladder cancer (HCC)  02/11/2024 Imaging   CT hematuria study 1. Contrast enhancing endoluminal mass of the right aspect of the urinary bladder measuring 5.4 x 2.9 cm with scattered associated calcifications and retraction of the bladder wall well as some adjacent fat stranding. 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  2. No evidence of lymphadenopathy or metastatic disease in the abdomen or pelvis. 3. Punctuate nonobstructive calculus of the midportion of the right kidney. No left-sided calculi, ureteral calculi, or hydronephrosis. 4. Hepatomegaly and hepatic steatosis. 5. Coronary artery disease.   03/07/2024 Pathology Results   Pathology A.  BLADDER TUMOR, TURBT:  Invasive High-grade urothelial carcinoma with giant cells (20%), poorly  differentiated cells (10%) and squamous cell (30%) components.  The carcinoma invades muscularis propria (detrusor muscle)    03/17/2024 Initial Diagnosis   Bladder cancer (HCC)   04/18/2024 -  Chemotherapy   Patient is on Treatment Plan : BLADDER DOSE DENSE MVAC q14d        PHYSICAL EXAMINATION: ECOG PERFORMANCE STATUS: 0 - Asymptomatic  Vitals:   03/25/24 1126  BP: (!) 143/62  Pulse: 87  Resp: 16  Temp: 98.1 F (36.7 C)  SpO2: 95%   Filed Weights   03/25/24 1126  Weight: 272 lb 12.8 oz (123.7 kg)   Gen: alert and no distress.  Conversational.  Relevant data reviewed during this visit included labs and imaging.

## 2024-03-25 NOTE — Patient Instructions (Signed)
 Discussed neoadjuvant chemoimmunotherapy is considered standard of care urothelial carcinoma.  The estimated event-free survival at 24 months was 67.8% in the durvalumab group and 59.8% in the comparison group.  The estimated overall survival at 24 months was 82.2% in the durvalumab group and 75.2% in the comparison group. NEJM 2024.   Discussed ddMVAC (methotrexate, vinblastine, doxorubicin, and cisplatin) followed by radical cystectomy.  Patient has normal kidney function can be the other option.  Discussed clinical evidence of neoadjuvant dose dense MVAC with GCSF versus other treatment. SWOG trial of over 300 and patient with muscle invasive disease randomized to radical cystectomy alone versus 3 cycles of neoadjuvant MVAC followed by cystectomy show improvement in median survival of 77 versus 46 months. ddMVAC improved complete response rate compared to standard MVAC from 11 to 25%.  Lower rate of residual disease at 15 versus 38%.  Other clinical trials report response rate of 38% and 10% on 2 different trials.  Discussed potential side effects, and schedules.  Patient will need chemotherapy teaching.  Discussed risk of anemia, fatigue, thrombocytopenia, neutropenia, risk of infection, sepsis.  Discussed potential risk of infection, mucositis, electrolyte abnormality from dehydration, neuropathy, hearing loss, renal insufficiency which can lead to other end organ damage, and rarely death and second cancer.  Discussed need for hospitalization, ED visit with neutropenic fever.  Any signs of infection should be taken seriously.  Report to ED with fever.  You will be scheduled for a teaching lession with our nurse to learn more about the drug, logistics and schedule for infusion appointment to start treatment.

## 2024-03-25 NOTE — Progress Notes (Signed)
 Assessment & Plan   Assessment/Plan:     Assessment and Plan Assessment & Plan Muscle invasive bladder cancer, urothelial carcinoma with squamous differentiation Muscle invasive bladder cancer with a 5.4 cm x 2.9 cm mass in the bladder. Pathology confirmed urothelial carcinoma with squamous differentiation. PET scan showed no metastasis outside the bladder. - Schedule echocardiogram to assess cardiac function before chemotherapy due to history of myocardial infarction and potential use of doxorubicin in MVAC regimen. - Insert metaport on April 04, 2024, for chemotherapy administration. - Coordinate with oncologist to finalize chemotherapy regimen based on echocardiogram results and preference for less toxic option. - Plan for surgery 8 weeks post-chemotherapy, tentatively February 2026. - Patient is considering ileal conduit over neobladder due to shorter recovery and fewer complications.  Myocardial infarction with coronary stents Myocardial infarction 10 years ago with placement of two coronary stents. - Schedule echocardiogram to assess cardiac function before potential use of doxorubicin in chemotherapy regimen.  Hyperlipidemia Hyperlipidemia managed with statin therapy. Recent lifestyle modifications include improved diet and exercise, resulting in significant LDL reduction.  Hypertension Hypertension managed with lisinopril .  Prediabetes Elevated fasting glucose and A1c in prediabetic range. Recent lifestyle changes have improved glucose levels, with recent reading at 103 mg/dL.  Peripheral neuropathy, bilateral feet Peripheral neuropathy in bilateral feet, possibly related to prediabetes and previous ankle surgery.  Facial lesion Facial lesion requiring dermatological evaluation. - Refer to [facility] for evaluation of facial lesion.      There are no discontinued medications.  Return if symptoms worsen or fail to improve.        Subjective:    Encounter date: 03/25/2024  Peter Oconnor is a 63 y.o. male who has CAD (coronary artery disease), native coronary artery; Hyperlipidemia with target LDL less than 70; Benign essential HTN; OSA (obstructive sleep apnea); Obesity (BMI 30-39.9); Left knee pain; Effusion of knee joint, left; Primary osteoarthritis of both knees; Encounter for removal of sutures; Light headed; Dislocation of right shoulder joint; History of ASCVD; Preop cardiovascular exam; Bladder cancer (HCC); Prediabetes; Peripheral polyneuropathy; and Skin lesion of face on their problem list..   He  has a past medical history of Allergy (1983), Arthritis, Bladder cancer [facility]) (July 2025), CAD (coronary artery disease), native coronary artery (01/25/2014), Cancer [facility]), CHF (congestive heart failure) (HCC), Chicken pox, Heart murmur (August 2025), Hyperlipidemia with target LDL less than 70 (10/19/2015), Hypertension, MI, old, Obesity (BMI 30-39.9) (10/19/2015), OSA (obstructive sleep apnea) (10/19/2015), Pre-diabetes, and Sleep apnea.SABRA   He presents with chief complaint of Establish Care (HM due- vaccinations (patient declined) colonoscopy done 2019) .   Discussed the use of AI scribe software for clinical note transcription with the patient, who gave verbal consent to proceed.  History of Present Illness Peter Oconnor is a 63 year old male with bladder cancer who presents to establish care.  Hematuria and bladder mass - Gross hematuria in early July 2025 prompted initial evaluation. - Urinalysis at urgent care ruled out urinary tract infection. - Imaging revealed a 5.4 cm x 2.9 cm bladder mass.  Bladder cancer and surgical management - Underwent cystectomy and transurethral resection of bladder tumor (TURBT) in August 2025. - Pathology confirmed muscle-invasive bladder cancer. - Referred to [facility] for evaluation of surgical options, including bladder removal and potential  reconstruction with neobladder or ileal conduit. - Consulted with medical oncology at [facility]; PET scan showed no metastasis outside the bladder.  Cardiovascular disease - History of myocardial infarction with  stent placement 10 years ago. - Takes statin and lisinopril  for secondary prevention. - Temporarily discontinued baby aspirin  due to urological procedures; plans to resume. - Active in strength training and plans to resume gym activities to prepare for potential surgery.  Glucose intolerance and neuropathy - History of elevated glucose levels and prediabetes. - Recent improvements in diet and exercise have reduced glucose levels. - Experiences neuropathy in the feet, possibly related to prior ankle fracture and surgery.  Lifestyle modifications and health maintenance - Attended a wellness resort to improve cholesterol levels. - Abstains from alcohol to aid in recovery. - Planning a trip to [place] in January 2026, with intent to coordinate travel with treatment schedule.     ROS  Past Surgical History:  Procedure Laterality Date   BLADDER INSTILLATION N/A 03/07/2024   Procedure: INSTILLATION, BLADDER;  Surgeon: Matilda Senior, MD;  Location: [facility];  Service: Urology;  Laterality: N/A;   CARDIAC CATHETERIZATION  2015   LHC w/ BMS x2 LCx and LAD  done in [place]    COLONOSCOPY     x1   EYE SURGERY Right 2014   Laser repair   done at [facility]   KNEE SURGERY Right 2000   torn Meniscus repair   done in [place]    NASAL SINUS SURGERY     ORIF ANKLE FRACTURE Right 09/20/2021   Procedure: OPEN REDUCTION INTERNAL FIXATION (ORIF) ANKLE FRACTURE;  Surgeon: Shari Sieving, MD;  Location: [facility];  Service: Orthopedics;  Laterality: Right;   VASECTOMY  2007    Outpatient Medications Prior to Visit  Medication Sig Dispense Refill   aspirin  EC 81 MG tablet Take 81 mg by mouth daily. Swallow whole.     Collagen-Vitamin C-Biotin (COLLAGEN PO) Take 2 Doses by mouth daily.      ibuprofen  (ADVIL ) 200 MG tablet Take 400 mg by mouth every 8 (eight) hours as needed (pain.).     lisinopril  (ZESTRIL ) 5 MG tablet Take 1 tablet (5 mg total) by mouth daily. 90 tablet 3   oxyCODONE  (ROXICODONE ) 5 MG immediate release tablet Take 1 tablet (5 mg total) by mouth every 4 (four) hours as needed for severe pain (pain score 7-10). 10 tablet 0   rosuvastatin  (CRESTOR ) 40 MG tablet TAKE 1 TABLET BY MOUTH DAILY 90 tablet 3   Facility-Administered Medications Prior to Visit  Medication Dose Route Frequency Provider Last Rate Last Admin   0.9 %  sodium chloride  infusion  500 mL Intravenous Once Eda Iha, MD        Family History  Problem Relation Age of Onset   Hyperlipidemia Mother    Leukemia Father    Arthritis Father    Heart attack Maternal Grandfather    Heart disease Maternal Grandfather    Hyperlipidemia Maternal Grandfather    Stroke Maternal Grandfather    Hyperlipidemia Maternal Grandmother    Colon cancer Neg Hx    Liver cancer Neg Hx    Stomach cancer Neg Hx    Rectal cancer Neg Hx    Esophageal cancer Neg Hx     Social History   Socioeconomic History   Marital status: Married    Spouse name: Not on file   Number of children: 3   Years of education: Not on file   Highest education level: Bachelor's degree (e.g., BA, AB, BS)  Occupational History   Occupation: self employed  Tobacco Use   Smoking status: Former    Current packs/day: 0.00    Types: Cigarettes, Cigars  Quit date: 03/14/1998    Years since quitting: 26.0   Smokeless tobacco: Never   Tobacco comments:    Stopped smoking cigars 2018  Vaping Use   Vaping status: Never Used  Substance and Sexual Activity   Alcohol use: Yes    Alcohol/week: 15.0 - 20.0 standard drinks of alcohol    Types: 15 - 20 Glasses of wine per week    Comment: Not drinking wine since discovered mass in bladder   Drug use: No   Sexual activity: Not Currently  Other Topics Concern   Not on file  Social  History Narrative   Not on file   Social Drivers of Health   Financial Resource Strain: Low Risk  (03/24/2024)   Overall Financial Resource Strain (CARDIA)    Difficulty of Paying Living Expenses: Not hard at all  Food Insecurity: No Food Insecurity (03/24/2024)   Hunger Vital Sign    Worried About Running Out of Food in the Last Year: Never true    Ran Out of Food in the Last Year: Never true  Transportation Needs: No Transportation Needs (03/24/2024)   PRAPARE - Administrator, Civil Service (Medical): No    Lack of Transportation (Non-Medical): No  Physical Activity: Sufficiently Active (03/24/2024)   Exercise Vital Sign    Days of Exercise per Week: 4 days    Minutes of Exercise per Session: 60 min  Stress: No Stress Concern Present (03/24/2024)   [employer] of Occupational Health - Occupational Stress Questionnaire    Feeling of Stress: Not at all  Social Connections: Unknown (03/24/2024)   Social Connection and Isolation Panel    Frequency of Communication with Friends and Family: Not on file    Frequency of Social Gatherings with Friends and Family: Not on file    Attends Religious Services: More than 4 times per year    Active Member of [employer] or Organizations: Patient declined    Attends Engineer, structural: Not on file    Marital Status: Married  Catering manager Violence: Not on file                                                                                                  Objective:  Physical Exam: BP (!) 140/70 (BP Location: Right Arm, Patient Position: Sitting, Cuff Size: Large) Comment: recheck done manual  Pulse 82   Temp 98.2 F (36.8 C) (Temporal)   Resp 18   Ht 5' 11 (1.803 m)   Wt 272 lb 12.8 oz (123.7 kg)   SpO2 100%   BMI 38.05 kg/m    Physical Exam GENERAL: Alert, cooperative, well developed, no acute distress HEENT: Normocephalic, normal oropharynx, moist mucous membranes CHEST: Clear to auscultation bilaterally,  No wheezes, rhonchi, or crackles CARDIOVASCULAR: Normal heart rate and rhythm, S1 and S2 normal without murmurs ABDOMEN: Soft, non-tender, non-distended, without organomegaly, Normal bowel sounds EXTREMITIES: No cyanosis or edema NEUROLOGICAL: Cranial nerves grossly intact, Moves all extremities without gross motor or sensory deficit   Physical Exam  NM PET Image Initial (PI) Skull Base To Thigh Result  Date: 03/23/2024 CLINICAL DATA:  Subsequent treatment strategy for bladder cancer. TURBT 03/07/2024. EXAM: NUCLEAR MEDICINE PET SKULL BASE TO THIGH TECHNIQUE: 12.8 mCi F-18 FDG was injected intravenously. Full-ring PET imaging was performed from the skull base to thigh after the radiotracer. CT data was obtained and used for attenuation correction and anatomic localization. Fasting blood glucose: 103 mg/dl COMPARISON:  CT scan 2/68/7974 FINDINGS: Mediastinal blood pool activity: SUV max 3.2 Liver activity: SUV max NA NECK: Physiologic palatine tonsillar activity. Incidental CT findings: Unremarkable CHEST: INSERT skip chest Incidental CT findings: Coronary, aortic arch, and branch vessel atherosclerotic vascular disease. Mild cardiomegaly. ABDOMEN/PELVIS: Wall thickening along the right side of the urinary bladder although not as focal as previous. Incidental CT findings: 2.2 cm left kidney simple cyst posteriorly and similar simple cyst medially. No further imaging workup of these lesions is indicated. Punctate nonobstructive right nephrolithiasis. Atherosclerosis is present, including aortoiliac atherosclerotic disease. Small retroperitoneal and pelvic lymph nodes are not pathologically enlarged by size criteria nor are they substantially hypermetabolic. SKELETON: Low-grade activity associated with healing fractures of the right transverse processes L1 and L2, without worrisome characteristics for pathologic fractures. Incidental CT findings: Mesoacromial os acromiale the right. Thoracolumbar spondylosis.  IMPRESSION: 1. Wall thickening along the right side of the urinary bladder although not as focal as previous. Some of this may be inflammatory following TURBT. 2. No findings of metastatic disease. 3. Low-grade activity associated with healing fractures of the right transverse processes of L1 and L2. 4.  Aortic Atherosclerosis (ICD10-I70.0). Electronically Signed   By: Ryan Salvage M.D.   On: 03/23/2024 16:26   CT HEMATURIA WORKUP Result Date: 02/11/2024 CLINICAL DATA:  Hematuria, low back pain * Tracking Code: BO * EXAM: CT ABDOMEN AND PELVIS WITHOUT AND WITH CONTRAST TECHNIQUE: Multidetector CT imaging of the abdomen and pelvis was performed following the standard protocol before and following the bolus administration of intravenous contrast. RADIATION DOSE REDUCTION: This exam was performed according to the departmental dose-optimization program which includes automated exposure control, adjustment of the mA and/or kV according to patient size and/or use of iterative reconstruction technique. CONTRAST:  OMNIPAQUE  IOHEXOL  300 MG/ML  SOLN COMPARISON:  None Available. FINDINGS: Lower chest: No acute abnormality.  Coronary artery calcifications. Hepatobiliary: No solid liver abnormality is seen. Hepatic steatosis. Hepatomegaly, maximum coronal span 20.2 cm. No gallstones, gallbladder wall thickening, or biliary dilatation. Pancreas: Unremarkable. No pancreatic ductal dilatation or surrounding inflammatory changes. Spleen: Normal in size without significant abnormality. Adrenals/Urinary Tract: Adrenal glands are unremarkable. Punctuate nonobstructive calculus of the midportion of the right kidney. No left-sided calculi, ureteral calculi, or hydronephrosis. Mild, asymmetric left-sided perinephric fat stranding (series 301, image 29). Multiple simple, benign left renal cortical cysts, for which no further follow-up or characterization is required. Contrast enhancing endoluminal mass of the right aspect of  the urinary bladder measuring 5.4 x 2.9 cm with scattered associated calcifications and retraction of the bladder wall well as some adjacent fat stranding (series 303, image 80). No additional urinary tract filling defect on delayed phase imaging. Stomach/Bowel: Stomach is within normal limits. Appendix appears normal. No evidence of bowel wall thickening, distention, or inflammatory changes. Vascular/Lymphatic: Aortic atherosclerosis. No enlarged abdominal or pelvic lymph nodes. Reproductive: Mild prostatomegaly. Other: No abdominal wall hernia or abnormality. No ascites. Musculoskeletal: No acute or significant osseous findings. IMPRESSION: 1. Contrast enhancing endoluminal mass of the right aspect of the urinary bladder measuring 5.4 x 2.9 cm with scattered associated calcifications and retraction of the bladder wall well as  some adjacent fat stranding. 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 2. No evidence of lymphadenopathy or metastatic disease in the abdomen or pelvis. 3. Punctuate nonobstructive calculus of the midportion of the right kidney. No left-sided calculi, ureteral calculi, or hydronephrosis. 4. Hepatomegaly and hepatic steatosis. 5. Coronary artery disease. These results will be called to the ordering clinician or representative by the Radiologist Assistant, and communication documented in the PACS or [employer]. Aortic Atherosclerosis (ICD10-I70.0). Electronically Signed   By: Marolyn JONETTA Jaksch M.D.   On: 02/11/2024 18:24    Recent Results (from the past 2160 hours)  POC urinalysis dipstick     Status: Abnormal   Collection Time: 01/21/24  1:11 PM  Result Value Ref Range   Color, UA red (A) yellow   Clarity, UA cloudy (A) clear   Glucose, UA negative negative mg/dL   Bilirubin, UA negative negative   Ketones, POC UA negative negative mg/dL   Spec Grav, UA 8.989 8.989 - 1.025   Blood, UA large (A) negative   pH, UA 6.0 5.0 - 8.0   Protein Ur, POC =100 (A) negative  mg/dL   Urobilinogen, UA 0.2 0.2 or 1.0 E.U./dL   Nitrite, UA Negative Negative   Leukocytes, UA Negative Negative  Urine Culture     Status: None   Collection Time: 01/21/24  1:45 PM   Specimen: Urine, Clean Catch  Result Value Ref Range   Specimen Description URINE, CLEAN CATCH    Special Requests NONE    Culture      NO GROWTH Performed at [facility], [address] [place], [place] [address]    Report Status 01/22/2024 FINAL   CBC with Differential     Status: None   Collection Time: 01/21/24  1:46 PM  Result Value Ref Range   WBC 7.6 4.0 - 10.5 K/uL   RBC 4.75 4.22 - 5.81 MIL/uL   Hemoglobin 14.9 13.0 - 17.0 g/dL   HCT 55.9 60.9 - 47.9 %   MCV 92.6 80.0 - 100.0 fL   MCH 31.4 26.0 - 34.0 pg   MCHC 33.9 30.0 - 36.0 g/dL   RDW 86.5 88.4 - 84.4 %   Platelets 179 150 - 400 K/uL   nRBC 0.0 0.0 - 0.2 %   Neutrophils Relative % 63 %   Neutro Abs 4.8 1.7 - 7.7 K/uL   Lymphocytes Relative 25 %   Lymphs Abs 1.9 0.7 - 4.0 K/uL   Monocytes Relative 9 %   Monocytes Absolute 0.7 0.1 - 1.0 K/uL   Eosinophils Relative 1 %   Eosinophils Absolute 0.1 0.0 - 0.5 K/uL   Basophils Relative 1 %   Basophils Absolute 0.1 0.0 - 0.1 K/uL   Immature Granulocytes 1 %   Abs Immature Granulocytes 0.04 0.00 - 0.07 K/uL    Comment: Performed at [facility], [address] [place], [place] [address]  Comprehensive metabolic panel     Status: Abnormal   Collection Time: 01/21/24  1:46 PM  Result Value Ref Range   Sodium 135 135 - 145 mmol/L   Potassium 4.8 3.5 - 5.1 mmol/L   Chloride 101 98 - 111 mmol/L   CO2 25 22 - 32 mmol/L   Glucose, Bld 109 (H) 70 - 99 mg/dL    Comment: Glucose reference range applies only to samples taken after fasting for at least 8 hours.   BUN 18 6 - 20 mg/dL   Creatinine, Ser 9.08 0.61 - 1.24  mg/dL   Calcium  9.9 8.9 - 10.3 mg/dL   Total Protein 7.8 6.5 - 8.1 g/dL   Albumin 4.6 3.5 - 5.0 g/dL   AST 31 15 - 41 U/L   ALT 30 0 - 44 U/L   Alkaline  Phosphatase 42 38 - 126 U/L   Total Bilirubin 1.0 0.0 - 1.2 mg/dL   GFR, Estimated >39 >39 mL/min    Comment: (NOTE) Calculated using the CKD-EPI Creatinine Equation (2021)    Anion gap 9 5 - 15    Comment: Performed at [facility], [address] [place], [place] [address]  Urinalysis, Routine w reflex microscopic     Status: Abnormal   Collection Time: 02/08/24 12:00 AM  Result Value Ref Range   Specific Gravity, UA 1.010 1.005 - 1.030   pH, UA 6.0 5.0 - 7.5   Color, UA Red (A) Yellow   Appearance Ur Hazy (A) Clear   Leukocytes,UA Negative Negative   Protein,UA 3+ (A) Negative/Trace   Glucose, UA Negative Negative   Ketones, UA Negative Negative   RBC, UA 3+ (A) Negative   Bilirubin, UA Negative Negative   Urobilinogen, Ur 0.2 0.2 - 1.0 mg/dL   Nitrite, UA Negative Negative   Microscopic Examination See below:   Microscopic Examination     Status: Abnormal   Collection Time: 02/08/24 12:00 AM   Urine  Result Value Ref Range   WBC, UA 0-5 0 - 5 /hpf   RBC, Urine >30 (A) 0 - 2 /hpf   Epithelial Cells (non renal) 0-10 0 - 10 /hpf   Bacteria, UA Few None seen/Few  Urinalysis, Routine w reflex microscopic     Status: Abnormal   Collection Time: 02/24/24 12:00 AM  Result Value Ref Range   Specific Gravity, UA <1.005 (L) 1.005 - 1.030   pH, UA 6.0 5.0 - 7.5   Color, UA Red (A) Yellow   Appearance Ur Clear Clear   Leukocytes,UA Negative Negative   Protein,UA 2+ (A) Negative/Trace   Glucose, UA Negative Negative   Ketones, UA Negative Negative   RBC, UA 3+ (A) Negative   Bilirubin, UA Negative Negative   Urobilinogen, Ur 0.2 0.2 - 1.0 mg/dL   Nitrite, UA Negative Negative   Microscopic Examination See below:   Microscopic Examination     Status: Abnormal   Collection Time: 02/24/24 12:00 AM   Urine  Result Value Ref Range   WBC, UA 0-5 0 - 5 /hpf   RBC, Urine 11-30 (A) 0 - 2 /hpf   Epithelial Cells (non renal) 0-10 0 - 10 /hpf   Bacteria, UA Few None seen/Few   Basic metabolic panel per protocol     Status: Abnormal   Collection Time: 03/01/24  9:23 AM  Result Value Ref Range   Sodium 139 135 - 145 mmol/L   Potassium 4.3 3.5 - 5.1 mmol/L   Chloride 105 98 - 111 mmol/L   CO2 24 22 - 32 mmol/L   Glucose, Bld 106 (H) 70 - 99 mg/dL    Comment: Glucose reference range applies only to samples taken after fasting for at least 8 hours.   BUN 11 6 - 20 mg/dL   Creatinine, Ser 9.13 0.61 - 1.24 mg/dL   Calcium  9.2 8.9 - 10.3 mg/dL   GFR, Estimated >39 >39 mL/min    Comment: (NOTE) Calculated using the CKD-EPI Creatinine Equation (2021)    Anion gap 10 5 - 15    Comment: Performed at  [facility], [address], [place], [place] [address]  CBC per protocol     Status: None   Collection Time: 03/01/24  9:23 AM  Result Value Ref Range   WBC 5.2 4.0 - 10.5 K/uL   RBC 4.74 4.22 - 5.81 MIL/uL   Hemoglobin 14.3 13.0 - 17.0 g/dL   HCT 55.5 60.9 - 47.9 %   MCV 93.7 80.0 - 100.0 fL   MCH 30.2 26.0 - 34.0 pg   MCHC 32.2 30.0 - 36.0 g/dL   RDW 87.1 88.4 - 84.4 %   Platelets 187 150 - 400 K/uL   nRBC 0.0 0.0 - 0.2 %    Comment: Performed at [facility], [address], [place], [place] [address]  Glucose, capillary     Status: Abnormal   Collection Time: 03/07/24  6:57 AM  Result Value Ref Range   Glucose-Capillary 129 (H) 70 - 99 mg/dL    Comment: Glucose reference range applies only to samples taken after fasting for at least 8 hours.   Comment 1 Notify RN    Comment 2 Document in Chart   Surgical pathology     Status: None   Collection Time: 03/07/24  9:37 AM  Result Value Ref Range   SURGICAL PATHOLOGY      SURGICAL PATHOLOGY CASE: [id number] PATIENT: [name] Surgical Pathology Report     Clinical History: Bladder mass (las)     FINAL MICROSCOPIC DIAGNOSIS:  A. BLADDER TUMOR, TURBT: Invasive High-grade urothelial carcinoma with giant cells (20%), poorly differentiated cells (10%) and  squamous cell (30%) components. The carcinoma invades muscularis propria (detrusor muscle)   GROSS DESCRIPTION:  Received fresh are multiple pink-tan to hemorrhagic soft tissue fragments measuring 5.2 x 5.0 x 1.0 cm in aggregate.  The specimen is entirely submitted in 10 blocks. (KW, 03/07/2024)   Final Diagnosis performed by Zhaoli Lane, MD.   Electronically signed 03/08/2024 Technical and / or Professional components performed at [facility], [address], [place], [place] [address].  Immunohistochemistry Technical component (if applicable) was performed at [facility]. [address], [place], [place] [address].   I MMUNOHISTOCHEMISTRY DISCLAIMER (if applicable): Some of these immunohistochemical stains may have been developed and the performance characteristics determine by [facility]. Some may not have been cleared or approved by the U.S. Food and Drug Administration. The FDA has determined that such clearance or approval is not necessary. This test is used for clinical purposes. It should not be regarded as investigational or for research. This laboratory is certified under the Clinical Laboratory Improvement Amendments of 1988 (CLIA-88) as qualified to perform high complexity clinical laboratory testing.  The controls stained appropriately.   IHC stains are performed on formalin fixed, paraffin embedded tissue using a 3,3diaminobenzidine (DAB) chromogen and Leica Bond Autostainer System. The staining intensity of the nucleus is score manually and is reported as the percentage of tumor cell nuclei demonstrating specific nuclear staining. The specimens are fixed in 1 0% Neutral Formalin for at least 6 hours and up to 72hrs. These tests are validated on decalcified tissue. Results should be interpreted with caution given the possibility of false negative results on decalcified specimens. Antibody Clones are  as follows ER-clone 35F, PR-clone 16, Ki67- clone MM1. Some of these immunohistochemical stains may have been developed and the performance characteristics determined by [facility].   CBC with Differential (Cancer Center Only)     Status: Abnormal  Collection Time: 03/18/24 11:15 AM  Result Value Ref Range   WBC Count 6.8 4.0 - 10.5 K/uL   RBC 4.18 (L) 4.22 - 5.81 MIL/uL   Hemoglobin 12.9 (L) 13.0 - 17.0 g/dL   HCT 61.8 (L) 60.9 - 47.9 %   MCV 91.1 80.0 - 100.0 fL   MCH 30.9 26.0 - 34.0 pg   MCHC 33.9 30.0 - 36.0 g/dL   RDW 87.2 88.4 - 84.4 %   Platelet Count 175 150 - 400 K/uL   nRBC 0.0 0.0 - 0.2 %   Neutrophils Relative % 66 %   Neutro Abs 4.5 1.7 - 7.7 K/uL   Lymphocytes Relative 23 %   Lymphs Abs 1.6 0.7 - 4.0 K/uL   Monocytes Relative 8 %   Monocytes Absolute 0.5 0.1 - 1.0 K/uL   Eosinophils Relative 2 %   Eosinophils Absolute 0.2 0.0 - 0.5 K/uL   Basophils Relative 1 %   Basophils Absolute 0.0 0.0 - 0.1 K/uL   Immature Granulocytes 0 %   Abs Immature Granulocytes 0.03 0.00 - 0.07 K/uL    Comment: Performed at [facility], [address], [place], [place] [address]  CMP (Cancer Center only)     Status: Abnormal   Collection Time: 03/18/24 11:15 AM  Result Value Ref Range   Sodium 141 135 - 145 mmol/L   Potassium 4.7 3.5 - 5.1 mmol/L   Chloride 107 98 - 111 mmol/L   CO2 31 22 - 32 mmol/L   Glucose, Bld 106 (H) 70 - 99 mg/dL    Comment: Glucose reference range applies only to samples taken after fasting for at least 8 hours.   BUN 15 6 - 20 mg/dL   Creatinine 9.21 9.38 - 1.24 mg/dL   Calcium  9.8 8.9 - 10.3 mg/dL   Total Protein 7.4 6.5 - 8.1 g/dL   Albumin 4.6 3.5 - 5.0 g/dL   AST 17 15 - 41 U/L   ALT 19 0 - 44 U/L   Alkaline Phosphatase 41 38 - 126 U/L   Total Bilirubin 0.5 0.0 - 1.2 mg/dL   GFR, Estimated >39 >39 mL/min    Comment: (NOTE) Calculated using the CKD-EPI Creatinine Equation (2021)    Anion gap 3 (L) 5 - 15     Comment: Performed at [facility], [address], [place], [place] [address]  Urinalysis, Routine w reflex microscopic     Status: Abnormal   Collection Time: 03/23/24 12:00 AM  Result Value Ref Range   Specific Gravity, UA 1.010 1.005 - 1.030   pH, UA 7.0 5.0 - 7.5   Color, UA Yellow Yellow   Appearance Ur Clear Clear   Leukocytes,UA Trace (A) Negative   Protein,UA Negative Negative/Trace   Glucose, UA Negative Negative   Ketones, UA Negative Negative   RBC, UA 2+ (A) Negative   Bilirubin, UA Negative Negative   Urobilinogen, Ur 0.2 0.2 - 1.0 mg/dL   Nitrite, UA Negative Negative   Microscopic Examination See below:   Microscopic Examination     Status: Abnormal   Collection Time: 03/23/24 12:00 AM   Urine  Result Value Ref Range   WBC, UA 6-10 (A) 0 - 5 /hpf   RBC, Urine 0-2 0 - 2 /hpf   Epithelial Cells (non renal) 0-10 0 - 10 /hpf   Bacteria, UA Few None seen/Few  Glucose, capillary     Status: Abnormal   Collection Time: 03/23/24  2:26  PM  Result Value Ref Range   Glucose-Capillary 103 (H) 70 - 99 mg/dL    Comment: Glucose reference range applies only to samples taken after fasting for at least 8 hours.        Beverley Adine Hummer, MD, MS

## 2024-03-28 ENCOUNTER — Ambulatory Visit (HOSPITAL_COMMUNITY)
Admission: RE | Admit: 2024-03-28 | Discharge: 2024-03-28 | Disposition: A | Discharge status: Home / Self Care | Source: Ambulatory Visit | Attending: Cardiology | Admitting: Cardiology

## 2024-03-28 DIAGNOSIS — R011 Cardiac murmur, unspecified: Secondary | ICD-10-CM | POA: Diagnosis not present

## 2024-03-28 DIAGNOSIS — Z8679 Personal history of other diseases of the circulatory system: Secondary | ICD-10-CM | POA: Diagnosis present

## 2024-03-28 DIAGNOSIS — I10 Essential (primary) hypertension: Secondary | ICD-10-CM | POA: Insufficient documentation

## 2024-03-28 DIAGNOSIS — Z01818 Encounter for other preprocedural examination: Secondary | ICD-10-CM | POA: Diagnosis not present

## 2024-03-28 DIAGNOSIS — E785 Hyperlipidemia, unspecified: Secondary | ICD-10-CM | POA: Diagnosis not present

## 2024-03-28 DIAGNOSIS — Z01810 Encounter for preprocedural cardiovascular examination: Secondary | ICD-10-CM | POA: Insufficient documentation

## 2024-03-28 LAB — ECHOCARDIOGRAM COMPLETE
AR max vel: 2.06 cm2
AV Area VTI: 1.84 cm2
AV Area mean vel: 1.82 cm2
AV Mean grad: 13.5 mmHg
AV Peak grad: 23.6 mmHg
Ao pk vel: 2.43 m/s
Area-P 1/2: 3.84 cm2
S' Lateral: 3.1 cm

## 2024-03-28 MED ORDER — PROCHLORPERAZINE MALEATE 10 MG PO TABS
10.0000 mg | ORAL_TABLET | Freq: Four times a day (QID) | ORAL | 1 refills | Status: DC | PRN
Start: 2024-03-28 — End: 2024-05-16

## 2024-03-28 MED ORDER — DEXAMETHASONE 4 MG PO TABS: ORAL_TABLET | ORAL | 1 refills | Status: AC

## 2024-03-28 MED ORDER — ONDANSETRON HCL 8 MG PO TABS: 8.0000 mg | ORAL_TABLET | Freq: Three times a day (TID) | ORAL | 1 refills | Status: AC | PRN

## 2024-03-28 MED ORDER — LIDOCAINE-PRILOCAINE 2.5-2.5 % EX CREA: TOPICAL_CREAM | CUTANEOUS | 3 refills | Status: AC

## 2024-03-28 NOTE — Progress Notes (Signed)
START ON PATHWAY REGIMEN - Bladder     A cycle is every 14 days:     Methotrexate      Vinblastine      Doxorubicin      Cisplatin      Pegfilgrastim-xxxx   **Always confirm dose/schedule in your pharmacy ordering system**  Patient Characteristics: Pre-Cystectomy or Nonsurgical Candidate, M0 (Clinical Staging), cT2-4a, cN0-1, M0, Cystectomy Eligible, Cisplatin-Based Chemotherapy Indicated with CrCl ? 60 mL/min and Minimal or No Symptoms Therapeutic Status: Pre-Cystectomy or Nonsurgical Candidate, M0 (Clinical Staging) AJCC M Category: cM0 AJCC 8 Stage Grouping: II AJCC T Category: cT2 AJCC N Category: cN0 Intent of Therapy: Curative Intent, Discussed with Patient 

## 2024-03-28 NOTE — Assessment & Plan Note (Signed)
 Will request earlier ECHO Plan on ddMVAC  Follow up with C1 Teaching appointment Plan on [facility] after NAC

## 2024-03-29 ENCOUNTER — Other Ambulatory Visit: Payer: Self-pay

## 2024-03-29 DIAGNOSIS — C679 Malignant neoplasm of bladder, unspecified: Secondary | ICD-10-CM

## 2024-03-30 ENCOUNTER — Other Ambulatory Visit: Payer: Self-pay

## 2024-03-30 NOTE — Telephone Encounter (Signed)
 No that should not be an issue with the chemotherapy. That will just need to be followed by yearly echoes  T. Shlomo, MD

## 2024-03-31 ENCOUNTER — Other Ambulatory Visit: Payer: Self-pay | Admitting: Radiology

## 2024-04-01 NOTE — H&P (Signed)
 Chief Complaint: Bladder cancer; referred for Port-A-Cath placement to assist with treatment  Referring Provider(s): Chang,R  Supervising Physician: Hughes Simmonds  Patient Status: Peter Oconnor - Out-pt  History of Present Illness: Peter Oconnor is a 63 y.o. male ex smoker  with past medical history significant for arthritis, coronary artery disease with prior MI, CHF, hyperlipidemia, hypertension, obesity, sleep apnea, prediabetes, and recently diagnosed bladder cancer.  He presents today for image guided Port-A-Cath placement to assist with treatment.   Patient is Full Code  Past Medical History:  Diagnosis Date   Allergy 1983   Penicillin   Arthritis    Bladder cancer (HCC) July 2025   CAD (coronary artery disease), native coronary artery 01/25/2014   S/p anterior MI with 90% LAD and occluded LCx s/p PCI of LAD and LCx with residual 30-40% stenosis of the RCA and normal LVF with lateral AK.     Cancer [facility])    Bladder   CHF (congestive heart failure) (HCC)    Patient denies   Chicken pox    Heart murmur August 2025   Test scheduled Nov 2025   Hyperlipidemia with target LDL less than 70 10/19/2015   Hypertension    MI, old    Obesity (BMI 30-39.9) 10/19/2015   OSA (obstructive sleep apnea) 10/19/2015   Pre-diabetes    Sleep apnea     Past Surgical History:  Procedure Laterality Date   BLADDER INSTILLATION N/A 03/07/2024   Procedure: INSTILLATION, BLADDER;  Surgeon: Matilda Senior, MD;  Location: [facility];  Service: Urology;  Laterality: N/A;   CARDIAC CATHETERIZATION  2015   LHC w/ BMS x2 LCx and LAD  done in [place]    COLONOSCOPY     x1   EYE SURGERY Right 2014   Laser repair   done at [facility]   KNEE SURGERY Right 2000   torn Meniscus repair   done in [place]    NASAL SINUS SURGERY     ORIF ANKLE FRACTURE Right 09/20/2021   Procedure: OPEN REDUCTION INTERNAL FIXATION (ORIF) ANKLE FRACTURE;  Surgeon: Shari Sieving, MD;  Location: [facility];  Service: Orthopedics;   Laterality: Right;   VASECTOMY  2007    Allergies: Penicillins  Medications: Prior to Admission medications   Medication Sig Start Date End Date Taking? Authorizing Provider  aspirin  EC 81 MG tablet Take 81 mg by mouth daily. Swallow whole.    [provider]  Collagen-Vitamin C-Biotin (COLLAGEN PO) Take 2 Doses by mouth daily.    [provider]  dexamethasone  (DECADRON ) 4 MG tablet Take 2 tablets (8 mg) by mouth daily x 3 days starting the day after cisplatin chemotherapy. Take with food. 03/28/24   Tina Pauletta BROCKS, MD  lidocaine -prilocaine  (EMLA ) cream Apply to affected area once 03/28/24   Tina Pauletta BROCKS, MD  lisinopril  (ZESTRIL ) 5 MG tablet Take 1 tablet (5 mg total) by mouth daily. 11/09/23   Shlomo Wilbert SAUNDERS, MD  ondansetron  (ZOFRAN ) 8 MG tablet Take 1 tablet (8 mg total) by mouth every 8 (eight) hours as needed for nausea or vomiting. Start on the third day after cisplatin. 03/28/24   Tina Pauletta BROCKS, MD  oxyCODONE  (ROXICODONE ) 5 MG immediate release tablet Take 1 tablet (5 mg total) by mouth every 4 (four) hours as needed for severe pain (pain score 7-10). 03/07/24   Matilda Senior, MD  prochlorperazine  (COMPAZINE ) 10 MG tablet Take 1 tablet (10 mg total) by mouth every 6 (six) hours as needed (Nausea or vomiting). 03/28/24   Tina,  Pauletta BROCKS, MD  rosuvastatin  (CRESTOR ) 40 MG tablet TAKE 1 TABLET BY MOUTH DAILY 11/12/23   Shlomo Wilbert SAUNDERS, MD     Family History  Problem Relation Age of Onset   Hyperlipidemia Mother    Leukemia Father    Arthritis Father    Heart attack Maternal Grandfather    Heart disease Maternal Grandfather    Hyperlipidemia Maternal Grandfather    Stroke Maternal Grandfather    Hyperlipidemia Maternal Grandmother    Colon cancer Neg Hx    Liver cancer Neg Hx    Stomach cancer Neg Hx    Rectal cancer Neg Hx    Esophageal cancer Neg Hx     Social History   Socioeconomic History   Marital status: Married    Spouse name: Not on file    Number of children: 3   Years of education: Not on file   Highest education level: Bachelor's degree (e.g., BA, AB, BS)  Occupational History   Occupation: self employed  Tobacco Use   Smoking status: Former    Current packs/day: 0.00    Types: Cigarettes, Cigars    Quit date: 03/14/1998    Years since quitting: 26.0   Smokeless tobacco: Never   Tobacco comments:    Stopped smoking cigars 2018  Vaping Use   Vaping status: Never Used  Substance and Sexual Activity   Alcohol use: Yes    Alcohol/week: 15.0 - 20.0 standard drinks of alcohol    Types: 15 - 20 Glasses of wine per week    Comment: Not drinking wine since discovered mass in bladder   Drug use: No   Sexual activity: Not Currently  Other Topics Concern   Not on file  Social History Narrative   Not on file   Social Drivers of Health   Financial Resource Strain: Low Risk  (03/24/2024)   Overall Financial Resource Strain (CARDIA)    Difficulty of Paying Living Expenses: Not hard at all  Food Insecurity: No Food Insecurity (03/24/2024)   Hunger Vital Sign    Worried About Running Out of Food in the Last Year: Never true    Ran Out of Food in the Last Year: Never true  Transportation Needs: No Transportation Needs (03/24/2024)   PRAPARE - Administrator, Civil Service (Medical): No    Lack of Transportation (Non-Medical): No  Physical Activity: Sufficiently Active (03/24/2024)   Exercise Vital Sign    Days of Exercise per Week: 4 days    Minutes of Exercise per Session: 60 min  Stress: No Stress Concern Present (03/24/2024)   [employer] of Occupational Health - Occupational Stress Questionnaire    Feeling of Stress: Not at all  Social Connections: Unknown (03/24/2024)   Social Connection and Isolation Panel    Frequency of Communication with Friends and Family: Not on file    Frequency of Social Gatherings with Friends and Family: Not on file    Attends Religious Services: More than 4 times per year     Active Member of Clubs or Organizations: Patient declined    Attends Engineer, structural: Not on file    Marital Status: Married       Review of Systems: Denies fever, headache, chest pain, dyspnea, cough, abdominal/back pain, nausea, vomiting or bleeding  Vital Signs: Vitals:   04/04/24 1132  BP: (!) 152/69  Pulse: 63  Resp: 16  Temp: 98.1 F (36.7 C)  SpO2: 95%      Advance Care  Plan: No documents on file    Physical Exam: Awake, alert.  Chest clear to auscultation bilaterally.  Heart with regular rate and rhythm.  Abdomen soft, obese, positive bowel sounds, nontender.  No lower extremity edema.  Imaging: ECHOCARDIOGRAM COMPLETE Result Date: 03/28/2024    ECHOCARDIOGRAM REPORT   Patient Name:   SWAN ZAYED Date of Exam: 03/28/2024 Medical Rec #:  [id number]     Height:       71.0 in Accession #:    [id number]    Weight:       272.8 lb Date of Birth:  02-Apr-1961    BSA:          2.406 m Patient Age:    62 years      BP:           140/70 mmHg Patient Gender: M             HR:           77 bpm. Exam Location:  [address] Procedure: 2D Echo, Cardiac Doppler, Color Doppler, 3D Echo and Strain Analysis            (Both Spectral and Color Flow Doppler were utilized during            procedure). Indications:    R01.1 Murmur; Z01.818 Encounter for other preprocedural                 examination; Chemo  History:        Patient has prior history of Echocardiogram examinations, most                 recent 06/29/2014. CAD; Risk Factors:Hypertension and                 Dyslipidemia.  Sonographer:    Augustin Seals RDCS Referring Phys: [id number] JAMES HOCHREIN IMPRESSIONS  1. Left ventricular ejection fraction, by estimation, is 60 to 65%. The left ventricle has normal function. The left ventricle has no regional wall motion abnormalities. There is mild left ventricular hypertrophy. Left ventricular diastolic parameters were normal. The average left ventricular global longitudinal  strain is -22.4 %. The global longitudinal strain is normal.  2. Right ventricular systolic function is normal. The right ventricular size is not well visualized.  3. Left atrial size was mildly dilated.  4. The mitral valve is normal in structure. Trivial mitral valve regurgitation. No evidence of mitral stenosis.  5. Aortic valve appears functionally bicuspid, with fusion of LCC-NCC by calcification. The aortic valve has an indeterminant number of cusps. There is moderate calcification of the aortic valve. There is moderate thickening of the aortic valve. Aortic valve regurgitation is not visualized. Mild aortic valve stenosis. Comparison(s): Prior images unable to be directly viewed, comparison made by report only. Echo from [facility] in 2015 noted evidence of lateral myocardial infarction. Conclusion(s)/Recommendation(s): Mild aortic stenosis with functionally bicuspid aortic valve due to calcification. FINDINGS  Left Ventricle: Left ventricular ejection fraction, by estimation, is 60 to 65%. The left ventricle has normal function. The left ventricle has no regional wall motion abnormalities. The average left ventricular global longitudinal strain is -22.4 %. Strain was performed and the global longitudinal strain is normal. The left ventricular internal cavity size was normal in size. There is mild left ventricular hypertrophy. Left ventricular diastolic parameters were normal. Right Ventricle: The right ventricular size is not well visualized. Right vetricular wall thickness was not well visualized. Right ventricular systolic function is normal. Left Atrium:  Left atrial size was mildly dilated. Right Atrium: Right atrial size was normal in size. Pericardium: There is no evidence of pericardial effusion. Mitral Valve: The mitral valve is normal in structure. Trivial mitral valve regurgitation. No evidence of mitral valve stenosis. Tricuspid Valve: The tricuspid valve is not well visualized.  Tricuspid valve regurgitation is trivial. No evidence of tricuspid stenosis. Aortic Valve: Aortic valve appears functionally bicuspid, with fusion of LCC-NCC by calcification. The aortic valve has an indeterminant number of cusps. There is moderate calcification of the aortic valve. There is moderate thickening of the aortic valve. Aortic valve regurgitation is not visualized. Mild aortic stenosis is present. Aortic valve mean gradient measures 13.5 mmHg. Aortic valve peak gradient measures 23.6 mmHg. Aortic valve area, by VTI measures 1.84 cm. Pulmonic Valve: The pulmonic valve was not well visualized. Pulmonic valve regurgitation is trivial. No evidence of pulmonic stenosis. Aorta: The aortic root, ascending aorta and aortic arch are all structurally normal, with no evidence of dilitation or obstruction. Venous: The inferior vena cava was not well visualized. IAS/Shunts: The atrial septum is grossly normal. Additional Comments: 3D was performed not requiring image post processing on an independent workstation and was normal.  LEFT VENTRICLE PLAX 2D LVIDd:         5.20 cm   Diastology LVIDs:         3.10 cm   LV e' medial:    10.10 cm/s LV PW:         1.00 cm   LV E/e' medial:  8.7 LV IVS:        1.20 cm   LV e' lateral:   12.00 cm/s LVOT diam:     2.20 cm   LV E/e' lateral: 7.3 LV SV:         92 LV SV Index:   38        2D Longitudinal Strain LVOT Area:     3.80 cm  2D Strain GLS (A4C):   -23.7 %                          2D Strain GLS (A3C):   -18.3 %                          2D Strain GLS (A2C):   -25.2 %                          2D Strain GLS Avg:     -22.4 %                           3D Volume EF:                          3D EF:        59 %                          LV EDV:       184 ml                          LV ESV:       75 ml  LV SV:        109 ml RIGHT VENTRICLE             IVC RV Basal diam:  4.10 cm     IVC diam: 1.70 cm RV Mid diam:    3.90 cm RV S prime:     16.80 cm/s   PULMONARY VEINS TAPSE (M-mode): 2.5 cm      A Reversal Velocity: 37.60 cm/s LEFT ATRIUM             Index        RIGHT ATRIUM           Index LA diam:        4.80 cm 2.00 cm/m   RA Area:     17.30 cm LA Vol (A2C):   75.6 ml 31.42 ml/m  RA Volume:   44.50 ml  18.50 ml/m LA Vol (A4C):   72.1 ml 29.97 ml/m LA Biplane Vol: 73.7 ml 30.63 ml/m  AORTIC VALVE AV Area (Vmax):    2.06 cm AV Area (Vmean):   1.82 cm AV Area (VTI):     1.84 cm AV Vmax:           243.00 cm/s AV Vmean:          169.500 cm/s AV VTI:            0.500 m AV Peak Grad:      23.6 mmHg AV Mean Grad:      13.5 mmHg LVOT Vmax:         132.00 cm/s LVOT Vmean:        81.300 cm/s LVOT VTI:          0.242 m LVOT/AV VTI ratio: 0.48  AORTA Ao Root diam: 3.60 cm Ao Asc diam:  3.70 cm MITRAL VALVE               TRICUSPID VALVE MV Area (PHT): 3.84 cm    TR Peak grad:   27.5 mmHg MV Decel Time: 198 msec    TR Vmax:        262.00 cm/s MV E velocity: 87.75 cm/s MV A velocity: 94.60 cm/s  SHUNTS MV E/A ratio:  0.93        Systemic VTI:  0.24 m                            Systemic Diam: 2.20 cm Shelda Bruckner MD Electronically signed by Shelda Bruckner MD Signature Date/Time: 03/28/2024/6:09:22 PM    Final    NM PET Image Initial (PI) Skull Base To Thigh Result Date: 03/23/2024 CLINICAL DATA:  Subsequent treatment strategy for bladder cancer. TURBT 03/07/2024. EXAM: NUCLEAR MEDICINE PET SKULL BASE TO THIGH TECHNIQUE: 12.8 mCi F-18 FDG was injected intravenously. Full-ring PET imaging was performed from the skull base to thigh after the radiotracer. CT data was obtained and used for attenuation correction and anatomic localization. Fasting blood glucose: 103 mg/dl COMPARISON:  CT scan 2/68/7974 FINDINGS: Mediastinal blood pool activity: SUV max 3.2 Liver activity: SUV max NA NECK: Physiologic palatine tonsillar activity. Incidental CT findings: Unremarkable CHEST: INSERT skip chest Incidental CT findings: Coronary, aortic arch, and branch vessel  atherosclerotic vascular disease. Mild cardiomegaly. ABDOMEN/PELVIS: Wall thickening along the right side of the urinary bladder although not as focal as previous. Incidental CT findings: 2.2 cm left kidney simple cyst posteriorly and similar simple cyst medially. No further imaging workup of these lesions is indicated. Punctate  nonobstructive right nephrolithiasis. Atherosclerosis is present, including aortoiliac atherosclerotic disease. Small retroperitoneal and pelvic lymph nodes are not pathologically enlarged by size criteria nor are they substantially hypermetabolic. SKELETON: Low-grade activity associated with healing fractures of the right transverse processes L1 and L2, without worrisome characteristics for pathologic fractures. Incidental CT findings: Mesoacromial os acromiale the right. Thoracolumbar spondylosis. IMPRESSION: 1. Wall thickening along the right side of the urinary bladder although not as focal as previous. Some of this may be inflammatory following TURBT. 2. No findings of metastatic disease. 3. Low-grade activity associated with healing fractures of the right transverse processes of L1 and L2. 4.  Aortic Atherosclerosis (ICD10-I70.0). Electronically Signed   By: Ryan Salvage M.D.   On: 03/23/2024 16:26    Labs:  CBC: Recent Labs    01/21/24 1346 03/01/24 0923 03/18/24 1115  WBC 7.6 5.2 6.8  HGB 14.9 14.3 12.9*  HCT 44.0 44.4 38.1*  PLT 179 187 175    COAGS: No results for input(s): INR, APTT in the last 8760 hours.  BMP: Recent Labs    10/14/23 0946 01/21/24 1346 03/01/24 0923 03/18/24 1115  NA 143 135 139 141  K 5.2 4.8 4.3 4.7  CL 104 101 105 107  CO2 24 25 24 31   GLUCOSE 111* 109* 106* 106*  BUN 15 18 11 15   CALCIUM  10.0 9.9 9.2 9.8  CREATININE 0.95 0.91 0.86 0.78  GFRNONAA  --  >60 >60 >60    LIVER FUNCTION TESTS: Recent Labs    10/14/23 0946 01/21/24 1346 03/18/24 1115  BILITOT 0.5 1.0 0.5  AST 25 31 17   ALT 22 30 19   ALKPHOS 57  42 41  PROT 7.0 7.8 7.4  ALBUMIN 4.6 4.6 4.6    TUMOR MARKERS: No results for input(s): AFPTM, CEA, CA199, CHROMGRNA in the last 8760 hours.  Assessment and Plan: 63 y.o. male ex smoker  with past medical history significant for arthritis, coronary artery disease with prior MI, CHF, hyperlipidemia, hypertension, obesity, sleep apnea, prediabetes, and recently diagnosed bladder cancer.  He presents today for image guided Port-A-Cath placement to assist with treatment.Risks and benefits of image guided port-a-catheter placement was discussed with the patient including, but not limited to bleeding, infection, pneumothorax, or fibrin sheath development and need for additional procedures.  All of the patient's questions were answered, patient is agreeable to proceed. Consent signed and in chart.    Thank you for allowing our service to participate in Jesten Cappuccio 's care.  Electronically Signed: D. Franky Rakers, PA-C   04/01/2024, 3:14 PM      I spent a total of  20 minutes  yesterday in face to face in clinical consultation, greater than 50% of which was counseling/coordinating care for port a cath placement

## 2024-04-03 ENCOUNTER — Ambulatory Visit: Payer: Self-pay | Admitting: Cardiology

## 2024-04-04 ENCOUNTER — Ambulatory Visit (HOSPITAL_COMMUNITY)
Admission: RE | Admit: 2024-04-04 | Discharge: 2024-04-04 | Disposition: A | Discharge status: Home / Self Care | Source: Ambulatory Visit

## 2024-04-04 ENCOUNTER — Encounter (HOSPITAL_COMMUNITY): Payer: Self-pay

## 2024-04-04 ENCOUNTER — Encounter: Payer: Self-pay | Admitting: *Deleted

## 2024-04-04 ENCOUNTER — Other Ambulatory Visit: Payer: Self-pay

## 2024-04-04 ENCOUNTER — Telehealth: Payer: Self-pay

## 2024-04-04 DIAGNOSIS — R7303 Prediabetes: Secondary | ICD-10-CM | POA: Diagnosis not present

## 2024-04-04 DIAGNOSIS — I2510 Atherosclerotic heart disease of native coronary artery without angina pectoris: Secondary | ICD-10-CM | POA: Diagnosis not present

## 2024-04-04 DIAGNOSIS — I509 Heart failure, unspecified: Secondary | ICD-10-CM | POA: Diagnosis not present

## 2024-04-04 DIAGNOSIS — Z6838 Body mass index (BMI) 38.0-38.9, adult: Secondary | ICD-10-CM | POA: Diagnosis not present

## 2024-04-04 DIAGNOSIS — G4730 Sleep apnea, unspecified: Secondary | ICD-10-CM | POA: Diagnosis not present

## 2024-04-04 DIAGNOSIS — E669 Obesity, unspecified: Secondary | ICD-10-CM | POA: Insufficient documentation

## 2024-04-04 DIAGNOSIS — C679 Malignant neoplasm of bladder, unspecified: Secondary | ICD-10-CM | POA: Insufficient documentation

## 2024-04-04 DIAGNOSIS — Z79899 Other long term (current) drug therapy: Secondary | ICD-10-CM | POA: Diagnosis not present

## 2024-04-04 DIAGNOSIS — Z87891 Personal history of nicotine dependence: Secondary | ICD-10-CM | POA: Diagnosis not present

## 2024-04-04 DIAGNOSIS — E785 Hyperlipidemia, unspecified: Secondary | ICD-10-CM | POA: Diagnosis not present

## 2024-04-04 DIAGNOSIS — I252 Old myocardial infarction: Secondary | ICD-10-CM | POA: Diagnosis not present

## 2024-04-04 DIAGNOSIS — I110 Hypertensive heart disease with heart failure: Secondary | ICD-10-CM | POA: Diagnosis not present

## 2024-04-04 HISTORY — PX: IR IMAGING GUIDED PORT INSERTION: IMG5740

## 2024-04-04 MED ORDER — DIPHENHYDRAMINE HCL 50 MG/ML IJ SOLN
INTRAMUSCULAR | Status: AC
  Filled 2024-04-04: qty 1

## 2024-04-04 MED ORDER — MIDAZOLAM HCL 2 MG/2ML IJ SOLN
INTRAMUSCULAR | Status: AC
  Filled 2024-04-04: qty 2

## 2024-04-04 MED ORDER — HEPARIN SOD (PORK) LOCK FLUSH 100 UNIT/ML IV SOLN
INTRAVENOUS | Status: AC
  Filled 2024-04-04: qty 5

## 2024-04-04 MED ORDER — HEPARIN SOD (PORK) LOCK FLUSH 100 UNIT/ML IV SOLN
500.0000 [IU] | Freq: Once | INTRAVENOUS | Status: AC
  Administered 2024-04-04: 500 [IU] via INTRAVENOUS

## 2024-04-04 MED ORDER — LIDOCAINE-EPINEPHRINE 1 %-1:100000 IJ SOLN
INTRAMUSCULAR | Status: AC
  Filled 2024-04-04: qty 1

## 2024-04-04 MED ORDER — MIDAZOLAM HCL 2 MG/2ML IJ SOLN
INTRAMUSCULAR | Status: AC | PRN
  Administered 2024-04-04 (×3): 1 mg via INTRAVENOUS

## 2024-04-04 MED ORDER — SODIUM CHLORIDE 0.9 % IV SOLN: INTRAVENOUS | Status: DC

## 2024-04-04 MED ORDER — LIDOCAINE-EPINEPHRINE 1 %-1:100000 IJ SOLN
20.0000 mL | Freq: Once | INTRAMUSCULAR | Status: AC
  Administered 2024-04-04: 20 mL via INTRADERMAL

## 2024-04-04 MED ORDER — FENTANYL CITRATE (PF) 100 MCG/2ML IJ SOLN
INTRAMUSCULAR | Status: AC | PRN
  Administered 2024-04-04 (×2): 50 ug via INTRAVENOUS

## 2024-04-04 MED ORDER — FENTANYL CITRATE (PF) 100 MCG/2ML IJ SOLN
INTRAMUSCULAR | Status: AC
  Filled 2024-04-04: qty 2

## 2024-04-04 MED ORDER — DIPHENHYDRAMINE HCL 50 MG/ML IJ SOLN
INTRAMUSCULAR | Status: AC | PRN
  Administered 2024-04-04: 50 mg via INTRAVENOUS

## 2024-04-04 NOTE — Discharge Instructions (Signed)
 Please call Interventional Radiology clinic [phone] with any questions or concerns.  You may remove your dressing and shower tomorrow.  After the procedure, it is common to have: Discomfort at the port insertion site. Bruising on the skin over the port. This should improve over 3-4 days  Follow these instructions at home:  Medication: Do not use Aspirin or ibuprofen products, such as Advil or Motrin, as it may increase bleeding.  You may resume your usual medications as ordered by your doctor. If your doctor prescribed antibiotics, take them as directed. Do not stop taking them just because you feel better. You need to take the full course of antibiotics.  Eating and drinking: Drink plenty of liquids to keep your urine pale yellow You can resume your regular diet as directed by your doctor   Care of the procedure site Follow instructions from your health care provider about how to take care of your port insertion site. Make sure you: After your port is placed, you will get a manufacturer's information card. The card has information about your port. Keep this card with you at all times Make sure to remember what type of port you have Take care of the port as told by your health care provider DO NOT use EMLA cream for 2 weeks after port placement -the cream will remove the surgical glue on your incision DO NOT use any lotions, creams, or ointments on incision for 2 weeks. This will remove the surgical glue on your incision Wash your hands with soap and water before and after you change your bandage (dressing). If soap and water are not available, use hand sanitizer Change your dressing as told by your health care provider Leave skin glue, or adhesive strips in place. These skin closures may need to stay in place for 2 weeks or longer Check your port insertion site every day for signs of infection. Check for: Redness, swelling, or pain Fluid or blood Warmth Pus or a bad  smell  Activity Return to your normal activities as told by your health care provider. Ask your health care provider what activities are safe for you Do not lift anything that is heavier than 10 lb (4.5 kg), or the limit that you are told, until your health care provider says that it is safe Do not take baths, swim, or use a hot tub until your health care provider approves. Take showers only. Keep all follow-up visits as told by your doctor  Contact a health care provider if: You cannot flush your port with saline as directed, or you cannot draw blood from the port You have a fever or chills You have redness, swelling, or pain around your port insertion site You have fluid or blood coming from your port insertion site Your port insertion site feels warm to the touch You have pus or a bad smell coming from the port insertion site  Get help right away if: You have chest pain or shortness of breath You have bleeding from your port that you cannot control  Moderate Conscious Sedation-Care After  This sheet gives you information about how to care for yourself after your procedure. Your health care provider may also give you more specific instructions. If you have problems or questions, contact your health care provider.  After the procedure, it is common to have: Sleepiness for several hours. Impaired judgment for several hours. Difficulty with balance. Vomiting if you eat too soon.  Follow these instructions at home:  Rest. Do not  participate in activities where you could fall or become injured. Do not drive or use machinery. Do not drink alcohol. Do not take sleeping pills or medicines that cause drowsiness. Do not make important decisions or sign legal documents. Do not take care of children on your own.  Eating and drinking Follow the diet recommended by your health care provider. Drink enough fluid to keep your urine pale yellow. If you vomit: Drink water, juice, or soup  when you can drink without vomiting. Make sure you have little or no nausea before eating solid foods.  General instructions Take over-the-counter and prescription medicines only as told by your health care provider. Have a responsible adult stay with you for the time you are told. It is important to have someone help care for you until you are awake and alert. Do not smoke. Keep all follow-up visits as told by your health care provider. This is important.  Contact a health care provider if: You are still sleepy or having trouble with balance after 24 hours. You feel light-headed. You keep feeling nauseous or you keep vomiting. You develop a rash. You have a fever. You have redness or swelling around the IV site.  Get help right away if: You have trouble breathing. You have new-onset confusion at home.  This information is not intended to replace advice given to you by your health care provider. Make sure you discuss any questions you have with your healthcare provider.

## 2024-04-04 NOTE — Telephone Encounter (Signed)
 Scheduled patient for first time treatment. Called and spoke with the patient, he is aware for appointments.

## 2024-04-04 NOTE — Procedures (Signed)
 Vascular and Interventional Radiology Procedure Note  Patient: Peter Oconnor DOB: 04/08/1961 Medical Record Number: [id number] Note Date/Time: 04/04/24 12:51 PM   Performing Physician: Thom Hall, MD Assistant(s): None  Diagnosis: Bladder CA  Procedure: PORT PLACEMENT  Anesthesia: Conscious Sedation Complications: None Estimated Blood Loss: Minimal  Findings:  Successful right-sided port placement, with the tip of the catheter in the proximal right atrium.  Plan: Catheter ready for use.  See detailed procedure note with images in PACS. The patient tolerated the procedure well without incident or complication and was returned to Recovery in stable condition.    Thom Hall, MD [facility]   Pager. [phone] Clinic. [phone]

## 2024-04-06 ENCOUNTER — Telehealth: Payer: Self-pay

## 2024-04-06 NOTE — Telephone Encounter (Signed)
 Scheduled appointments per WQ. Called and left a VM with appointment details for the patient.

## 2024-04-07 NOTE — Telephone Encounter (Addendum)
 noted  ----- Message from Glendale BIRCH, RN sent at 04/07/2024  1:09 PM EDT ----- Regarding: FW: Surgery Time Frame Erskin Sark,  Once Mr. Mould begins chemo - I will send you a surgery date time frame.    FYI below - he is changing insurance on 07/14/24.  Thank you, Slater ----- Message ----- From: Eva Natal, MD Sent: 04/07/2024  12:29 PM EDT To: Glendale Kipper, RN Subject: RE: Surgery Time Frame                         We can push the surgery out to by a few weeks no problem. ----- Message ----- From: Glendale Kipper, RN Sent: 04/07/2024  10:57 AM EDT To: Eva Natal, MD Subject: Surgery Time Frame                             Hi Dr. Natal,  Mr. Harrower is the [facility] patient you recently saw with [facility].  He has decided to switch insurance plans and will have an in-network plan beginning 07/14/24.  He is beginning dd-MVAC on 10/6.  This would put his surgery time frame around 12/29 - 1/5 (6-7 weeks post last chemo infusion).  He is concerned about obtaining authorization with a new plan.  He wants your opinion on taking a 5th cycle of chemo or pushing the surgery out 8 weeks or a little further (mid-end of January).  Thank you, Slater

## 2024-04-08 ENCOUNTER — Ambulatory Visit: Admitting: Family Medicine

## 2024-04-11 NOTE — Progress Notes (Signed)
 Pharmacist Chemotherapy Monitoring - Initial Assessment    Anticipated start date: 04/18/24   The following has been reviewed per standard work regarding the patient's treatment regimen: The patient's diagnosis, treatment plan and drug doses, and organ/hematologic function Lab orders and baseline tests specific to treatment regimen  The treatment plan start date, drug sequencing, and pre-medications Prior authorization status  Patient's documented medication list, including drug-drug interaction screen and prescriptions for anti-emetics and supportive care specific to the treatment regimen The drug concentrations, fluid compatibility, administration routes, and timing of the medications to be used The patient's access for treatment and lifetime cumulative dose history, if applicable  The patient's medication allergies and previous infusion related reactions, if applicable   Changes made to treatment plan:  N/A  Follow up needed:  N/A   Peter Oconnor, PharmD, MBA

## 2024-04-14 ENCOUNTER — Inpatient Hospital Stay

## 2024-04-14 DIAGNOSIS — Z79899 Other long term (current) drug therapy: Secondary | ICD-10-CM | POA: Insufficient documentation

## 2024-04-14 DIAGNOSIS — C679 Malignant neoplasm of bladder, unspecified: Secondary | ICD-10-CM | POA: Insufficient documentation

## 2024-04-14 DIAGNOSIS — Z5111 Encounter for antineoplastic chemotherapy: Secondary | ICD-10-CM | POA: Insufficient documentation

## 2024-04-14 NOTE — Telephone Encounter (Signed)
 Telephone Note  I called Peter Oconnor today to review the discordant pathology results between his original TURBT specimen and the internal over read of that same specimen by our [facility] pathologist.  I explained that while the pathology specimen may not be definitive for muscle invasion, this is most likely due to sampling error.  The imaging alone shows a large right bladder wall mass with severe retraction, perinephric fat stranding, and calcification within the tumor.  This would be considered radiographic evidence of T3 disease, which indicates muscle invasion.  The operative report from his original TURBT procedure also indicates that there was gross evidence of muscle invasion.  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  For this reason, I think there is overwhelming evidence that this is truly muscle-invasive disease and I have recommended proceeding with neoadjuvant chemotherapy as currently planned.  The patient expresses appreciation for this explanation and is in agreement with the plan to begin his chemotherapy on Monday.  He had no additional questions at the conclusion of our call.

## 2024-04-15 NOTE — Progress Notes (Signed)
 [facility] OFFICE PROGRESS NOTE  Patient Care Team: Sebastian Beverley NOVAK, MD as PCP - General (Family Medicine) Shlomo Wilbert SAUNDERS, MD as PCP - Cardiology (Cardiology)  Erin is a 63 y.o.male with history of tobacco abuse, HTN, CAD, MI, hyperlipidemia and sleep apnea  [facility].    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  Treatment: 04/18/24 start cycle 1 day 1 ddMVAC  Outside pathology suggested invasion into the lamina propria.  We discussed this today.  Imaging result most consistent with clinical T3. Dr. Kinnie also concur with this.  Plan on 4-6 cycles, as tolerated followed by cystectomy. Dr. Eva Kinnie will perform at [facility].   Patient is very well versed  regarding his condition, and treatment options. Assessment & Plan Malignant neoplasm of urinary bladder, unspecified site (HCC) C1 ddMVAC today Follow up with C2 Neutropenic and thrombocytopenic precautions Advised to consume adequate fiber, fluid intake, prevent constipation which he has already been doing so   [name], MD  INTERVAL HISTORY: Patient returns for follow-up.  His wife is with him.  Overall he is feeling well and ready to start treatment.  He has few questions.  He will be changing his insurance back to [employer].  He is expecting his cystectomy to be covered in January.  No new physical symptoms.  No hematuria or difficulty urinating.  He is drinking plenty of fluid, and reported eating well, physically active and no limitations.  Oncology History  Bladder cancer (HCC)  02/11/2024 Imaging   CT hematuria study 1. Contrast enhancing endoluminal mass of the right aspect of the urinary bladder measuring 5.4 x 2.9 cm with scattered associated calcifications and retraction of the bladder wall well as some adjacent fat  stranding. 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  2. No evidence of lymphadenopathy or metastatic disease in the abdomen or pelvis. 3. Punctuate nonobstructive calculus of the midportion of the right kidney. No left-sided calculi, ureteral calculi, or hydronephrosis. 4. Hepatomegaly and hepatic steatosis. 5. Coronary artery disease.   03/07/2024 Pathology Results   Pathology A. BLADDER TUMOR, TURBT:  Invasive High-grade urothelial carcinoma with giant cells (20%), poorly  differentiated cells (10%) and squamous cell (30%) components.  The carcinoma invades muscularis propria (detrusor muscle)    03/17/2024 Initial Diagnosis   Bladder cancer (HCC)   04/18/2024 -  Chemotherapy   Patient is on Treatment Plan : BLADDER DOSE DENSE MVAC q14d        PHYSICAL EXAMINATION: ECOG PERFORMANCE STATUS: 0 - Asymptomatic  Vitals:   04/18/24 1355  BP: (!) 143/75  Pulse: 84  Resp: 20  Temp: 98.1 F (36.7 C)  SpO2: 95%   Filed Weights   04/18/24 1355  Weight: 280 lb 14.4 oz (127.4 kg)   GENERAL: alert, no distress and comfortable SKIN: skin color normal  LUNGS: clear to auscultation and percussion with normal breathing effort HEART: regular rate & rhythm  ABDOMEN: abdomen soft, non-tender and nondistended. Musculoskeletal: no edema   Relevant data reviewed during this visit included labs and imaging.

## 2024-04-15 NOTE — Assessment & Plan Note (Addendum)
 C1 ddMVAC today Follow up with C2 Neutropenic and thrombocytopenic precautions Advised to consume adequate fiber, fluid intake, prevent constipation which he has already been doing so

## 2024-04-18 ENCOUNTER — Inpatient Hospital Stay

## 2024-04-18 ENCOUNTER — Other Ambulatory Visit

## 2024-04-18 VITALS — BP 130/67 | HR 69 | Temp 98.6°F | Resp 16

## 2024-04-18 VITALS — BP 143/75 | HR 84 | Temp 98.1°F | Resp 20 | Wt 280.9 lb

## 2024-04-18 DIAGNOSIS — C679 Malignant neoplasm of bladder, unspecified: Secondary | ICD-10-CM

## 2024-04-18 DIAGNOSIS — Z5111 Encounter for antineoplastic chemotherapy: Secondary | ICD-10-CM | POA: Diagnosis present

## 2024-04-18 DIAGNOSIS — Z79899 Other long term (current) drug therapy: Secondary | ICD-10-CM | POA: Diagnosis not present

## 2024-04-18 LAB — CBC WITH DIFFERENTIAL (CANCER CENTER ONLY)
Abs Immature Granulocytes: 0.03 K/uL (ref 0.00–0.07)
Basophils Absolute: 0 K/uL (ref 0.0–0.1)
Basophils Relative: 1 %
Eosinophils Absolute: 0.1 K/uL (ref 0.0–0.5)
Eosinophils Relative: 1 %
HCT: 41.9 % (ref 39.0–52.0)
Hemoglobin: 14.4 g/dL (ref 13.0–17.0)
Immature Granulocytes: 0 %
Lymphocytes Relative: 23 %
Lymphs Abs: 1.6 K/uL (ref 0.7–4.0)
MCH: 30.6 pg (ref 26.0–34.0)
MCHC: 34.4 g/dL (ref 30.0–36.0)
MCV: 89 fL (ref 80.0–100.0)
Monocytes Absolute: 0.6 K/uL (ref 0.1–1.0)
Monocytes Relative: 9 %
Neutro Abs: 4.7 K/uL (ref 1.7–7.7)
Neutrophils Relative %: 66 %
Platelet Count: 162 K/uL (ref 150–400)
RBC: 4.71 MIL/uL (ref 4.22–5.81)
RDW: 13.2 % (ref 11.5–15.5)
WBC Count: 7 K/uL (ref 4.0–10.5)
nRBC: 0 % (ref 0.0–0.2)

## 2024-04-18 LAB — CMP (CANCER CENTER ONLY)
ALT: 23 U/L (ref 0–44)
AST: 23 U/L (ref 15–41)
Albumin: 4.6 g/dL (ref 3.5–5.0)
Alkaline Phosphatase: 46 U/L (ref 38–126)
Anion gap: 7 (ref 5–15)
BUN: 19 mg/dL (ref 6–20)
CO2: 27 mmol/L (ref 22–32)
Calcium: 9.8 mg/dL (ref 8.9–10.3)
Chloride: 104 mmol/L (ref 98–111)
Creatinine: 0.86 mg/dL (ref 0.61–1.24)
GFR, Estimated: >60 mL/min (ref >60)
Glucose, Bld: 101 mg/dL — ABNORMAL HIGH (ref 70–99)
Potassium: 4 mmol/L (ref 3.5–5.1)
Sodium: 138 mmol/L (ref 135–145)
Total Bilirubin: 0.7 mg/dL (ref 0.0–1.2)
Total Protein: 7.6 g/dL (ref 6.5–8.1)

## 2024-04-18 LAB — MAGNESIUM: Magnesium: 2 mg/dL (ref 1.7–2.4)

## 2024-04-18 MED ORDER — SODIUM CHLORIDE 0.9% FLUSH
10.0000 mL | INTRAVENOUS | Status: DC | PRN
  Administered 2024-04-18: 10 mL

## 2024-04-18 MED ORDER — ONDANSETRON HCL 4 MG/2ML IJ SOLN
8.0000 mg | Freq: Once | INTRAMUSCULAR | Status: AC
  Administered 2024-04-18: 8 mg via INTRAVENOUS
  Filled 2024-04-18: qty 4

## 2024-04-18 MED ORDER — SODIUM CHLORIDE 0.9 % IV SOLN: INTRAVENOUS | Status: DC

## 2024-04-18 MED ORDER — METHOTREXATE SODIUM CHEMO INJECTION (PF) 50 MG/2ML
30.0000 mg/m2 | Freq: Once | INTRAMUSCULAR | Status: AC
  Administered 2024-04-18: 74.75 mg via INTRAVENOUS
  Filled 2024-04-18: qty 2.99

## 2024-04-18 MED ORDER — DEXAMETHASONE SODIUM PHOSPHATE 10 MG/ML IJ SOLN
10.0000 mg | Freq: Once | INTRAMUSCULAR | Status: AC
  Administered 2024-04-18: 10 mg via INTRAVENOUS
  Filled 2024-04-18: qty 1

## 2024-04-18 MED FILL — Fosaprepitant Dimeglumine For IV Infusion 150 MG (Base Eq): INTRAVENOUS | Qty: 5 | Status: AC

## 2024-04-18 NOTE — Patient Instructions (Signed)
 [facility] - A DEPT OF [facility]  Discharge Instructions: Thank you for choosing [facility] to provide your oncology and hematology care.   If you have a lab appointment with the Cancer Center, please go directly to the Cancer Center and check in at the registration area.   Wear comfortable clothing and clothing appropriate for easy access to any Portacath or PICC line.   We strive to give you quality time with your provider. You may need to reschedule your appointment if you arrive late (15 or more minutes).  Arriving late affects you and other patients whose appointments are after yours.  Also, if you miss three or more appointments without notifying the office, you may be dismissed from the clinic at the provider's discretion.      For prescription refill requests, have your pharmacy contact our office and allow 72 hours for refills to be completed.    Today you received the following chemotherapy and/or immunotherapy agent: Methotrexate   To help prevent nausea and vomiting after your treatment, we encourage you to take your nausea medication as directed.  BELOW ARE SYMPTOMS THAT SHOULD BE REPORTED IMMEDIATELY: *FEVER GREATER THAN 100.4 F (38 C) OR HIGHER *CHILLS OR SWEATING *NAUSEA AND VOMITING THAT IS NOT CONTROLLED WITH YOUR NAUSEA MEDICATION *UNUSUAL SHORTNESS OF BREATH *UNUSUAL BRUISING OR BLEEDING *URINARY PROBLEMS (pain or burning when urinating, or frequent urination) *BOWEL PROBLEMS (unusual diarrhea, constipation, pain near the anus) TENDERNESS IN MOUTH AND THROAT WITH OR WITHOUT PRESENCE OF ULCERS (sore throat, sores in mouth, or a toothache) UNUSUAL RASH, SWELLING OR PAIN  UNUSUAL VAGINAL DISCHARGE OR ITCHING   Items with * indicate a potential emergency and should be followed up as soon as possible or go to the Emergency Department if any problems should occur.  Please show the CHEMOTHERAPY ALERT CARD or IMMUNOTHERAPY  ALERT CARD at check-in to the Emergency Department and triage nurse.  Should you have questions after your visit or need to cancel or reschedule your appointment, please contact [name]  Dept: [phone]  and follow the prompts.  Office hours are 8:00 a.m. to 4:30 p.m. Monday - Friday. Please note that voicemails left after 4:00 p.m. may not be returned until the following business day.  We are closed weekends and major holidays. You have access to a nurse at all times for urgent questions. Please call the main number to the clinic Dept: [phone] and follow the prompts.   For any non-urgent questions, you may also contact your provider using MyChart. We now offer e-Visits for anyone 57 and older to request care online for non-urgent symptoms. For details visit [email or web address].   Also download the MyChart app! Go to the app store, search MyChart, open the app, select , and log in with your MyChart username and password.  Methotrexate Injection What is this medication? METHOTREXATE (METH oh TREX ate) treats inflammatory conditions such as arthritis and psoriasis. It works by decreasing inflammation, which can reduce pain and prevent long-term injury to the joints and skin. It may also be used to treat some types of cancer. It works by slowing down the growth of cancer cells. This medicine may be used for other purposes; ask your health care provider or pharmacist if you have questions. What should I tell my care team before I take this medication? They need to know if you  have any of these conditions: Fluid in the stomach area or lungs Frequently drink alcohol Infection or immune system problems Kidney disease Liver disease Low blood counts (white cells, platelets, or red blood cells) Lung disease Recent or ongoing radiation Recent or upcoming vaccine Stomach ulcers Ulcerative colitis An unusual or allergic  reaction to methotrexate, other medications, foods, dyes, or preservatives Pregnant or trying to get pregnant Breastfeeding How should I use this medication? This medication is for infusion into a vein or for injection into muscle or into the spinal fluid (whichever applies). It is usually given in a hospital or clinic setting. In rare cases, you might get this medication at home. You will be taught how to give this medication. Use exactly as directed. Take your medication at regular intervals. Do not take your medication more often than directed. If this medication is used for arthritis or psoriasis, it should be taken weekly, NOT daily. It is important that you put your used needles and syringes in a special sharps container. Do not put them in a trash can. If you do not have a sharps container, call your pharmacist or care team to get one. Talk to your care team about the use of this medication in children. While this medication may be prescribed for children as young as 2 years for selected conditions, precautions do apply. Overdosage: If you think you have taken too much of this medicine contact a poison control center or emergency room at once. NOTE: This medicine is only for you. Do not share this medicine with others. What if I miss a dose? It is important not to miss your dose. Call your care team if you are unable to keep an appointment. If you give yourself the medication, and you miss a dose, talk with your care team. Do not take double or extra doses. What may interact with this medication? Do not take this medication with any of the following: Acitretin Probenecid This medication may also interact with the following: Aspirin  or aspirin -like medications Azathioprine Certain antibiotics, such as gentamicin, penicillin, tetracycline, vancomycin  Certain medications that treat or prevent blood clots, such as warfarin, apixaban, dabigatran, rivaroxaban Certain medications for stomach  problems, such as esomeprazole, omeprazole, pantoprazole Dapsone Hydroxychloroquine Live virus vaccines Medications for viral infections, such as acyclovir, cidofovir, foscarnet, ganciclovir Mercaptopurine NSAIDs, medications for pain and inflammation, such as ibuprofen  or naproxen Phenytoin Pyrimethamine Retinoids, such as isotretinoin or tretinoin Sulfonamides, such as sulfasalazine or trimethoprim; sulfamethoxazole Theophylline This list may not describe all possible interactions. Give your health care provider a list of all the medicines, herbs, non-prescription drugs, or dietary supplements you use. Also tell them if you smoke, drink alcohol, or use illegal drugs. Some items may interact with your medicine. What should I watch for while using this medication? This medication may make you feel generally unwell. This is not uncommon as chemotherapy can affect healthy cells as well as cancer cells. Report any side effects. Continue your course of treatment even though you feel ill unless your care team tells you to stop. Your condition will be monitored carefully while you are receiving this medication. Avoid alcoholic drinks. This medication can cause serious side effects. To reduce the risk, your care team may give you other medications to take before receiving this one. Be sure to follow the directions from your care team. This medication can make you more sensitive to the sun. Keep out of the sun. If you cannot avoid being in the sun, wear protective clothing  and use sunscreen. Do not use sun lamps or tanning beds/booths. You may get drowsy or dizzy. Do not drive, use machinery, or do anything that needs mental alertness until you know how this medication affects you. Do not stand or sit up quickly, especially if you are an older patient. This reduces the risk of dizzy or fainting spells. You may need blood work while you are taking this medication. Call your care team for advice if you  get a fever, chills or sore throat, or other symptoms of a cold or flu. Do not treat yourself. This medication decreases your body's ability to fight infections. Try to avoid being around people who are sick. This medication may increase your risk to bruise or bleed. Call your care team if you notice any unusual bleeding. Be careful brushing or flossing your teeth or using a toothpick because you may get an infection or bleed more easily. If you have any dental work done, tell your dentist you are receiving this medication Check with your care team if you get an attack of severe diarrhea, nausea and vomiting, or if you sweat a lot. The loss of too much body fluid can make it dangerous for you to take this medication. Talk to your care team about your risk of cancer. You may be more at risk for certain types of cancers if you take this medication. Do not become pregnant while taking this medication or for 6 months after stopping it. Women should inform their care team if they wish to become pregnant or think they might be pregnant. Men should not father a child while taking this medication and for 3 months after stopping it. There is potential for serious harm to an unborn child. Talk to your care team for more information. Do not breast-feed an infant while taking this medication or for 1 week after stopping it. This medication may make it more difficult to get pregnant or father a child. Talk to your care team if you are concerned about your fertility. What side effects may I notice from receiving this medication? Side effects that you should report to your care team as soon as possible: Allergic reactions--skin rash, itching, hives, swelling of the face, lips, tongue, or throat Blood clot--pain, swelling, or warmth in the leg, shortness of breath, chest pain Dry cough, shortness of breath or trouble breathing Infection--fever, chills, cough, sore throat, wounds that don't heal, pain or trouble when  passing urine, general feeling of discomfort or being unwell Kidney injury--decrease in the amount of urine, swelling of the ankles, hands, or feet Liver injury--right upper belly pain, loss of appetite, nausea, light-colored stool, dark yellow or brown urine, yellowing of the skin or eyes, unusual weakness or fatigue Low red blood cell count--unusual weakness or fatigue, dizziness, headache, trouble breathing Redness, blistering, peeling, or loosening of the skin, including inside the mouth Seizures Unusual bruising or bleeding Side effects that usually do not require medical attention (report to your care team if they continue or are bothersome): Diarrhea Dizziness Hair loss Nausea Pain, redness, or swelling with sores inside the mouth or throat Vomiting This list may not describe all possible side effects. Call your doctor for medical advice about side effects. You may report side effects to FDA at 1-800-FDA-1088. Where should I keep my medication? This medication is given in a hospital or clinic. It will not be stored at home. NOTE: This sheet is a summary. It may not cover all possible information. If you have  questions about this medicine, talk to your doctor, pharmacist, or health care provider.  2024 Elsevier/Gold Standard (2022-12-03 00:00:00)

## 2024-04-19 ENCOUNTER — Inpatient Hospital Stay

## 2024-04-19 VITALS — BP 132/64 | HR 75 | Temp 98.1°F | Resp 16

## 2024-04-19 DIAGNOSIS — Z5111 Encounter for antineoplastic chemotherapy: Secondary | ICD-10-CM | POA: Diagnosis not present

## 2024-04-19 DIAGNOSIS — C679 Malignant neoplasm of bladder, unspecified: Secondary | ICD-10-CM

## 2024-04-19 MED ORDER — SODIUM CHLORIDE 0.9 % IV SOLN
150.0000 mg | Freq: Once | INTRAVENOUS | Status: AC
  Administered 2024-04-19: 150 mg via INTRAVENOUS
  Filled 2024-04-19: qty 5

## 2024-04-19 MED ORDER — PALONOSETRON HCL INJECTION 0.25 MG/5ML
0.2500 mg | Freq: Once | INTRAVENOUS | Status: AC
  Administered 2024-04-19: 0.25 mg via INTRAVENOUS
  Filled 2024-04-19: qty 5

## 2024-04-19 MED ORDER — SODIUM CHLORIDE 0.9 % IV SOLN
70.0000 mg/m2 | Freq: Once | INTRAVENOUS | Status: AC
  Administered 2024-04-19: 174 mg via INTRAVENOUS
  Filled 2024-04-19: qty 174

## 2024-04-19 MED ORDER — MAGNESIUM SULFATE 2 GM/50ML IV SOLN
2.0000 g | Freq: Once | INTRAVENOUS | Status: AC
  Administered 2024-04-19: 2 g via INTRAVENOUS
  Filled 2024-04-19: qty 50

## 2024-04-19 MED ORDER — POTASSIUM CHLORIDE IN NACL 20-0.9 MEQ/L-% IV SOLN
Freq: Once | INTRAVENOUS | Status: AC
  Filled 2024-04-19: qty 1000

## 2024-04-19 MED ORDER — DOXORUBICIN HCL CHEMO IV INJECTION 2 MG/ML
30.0000 mg/m2 | Freq: Once | INTRAVENOUS | Status: AC
  Administered 2024-04-19: 74 mg via INTRAVENOUS
  Filled 2024-04-19: qty 37

## 2024-04-19 MED ORDER — DEXAMETHASONE SODIUM PHOSPHATE 10 MG/ML IJ SOLN
10.0000 mg | Freq: Once | INTRAMUSCULAR | Status: AC
  Administered 2024-04-19: 10 mg via INTRAVENOUS
  Filled 2024-04-19: qty 1

## 2024-04-19 MED ORDER — VINBLASTINE SULFATE CHEMO INJECTION 1 MG/ML
3.0000 mg/m2 | Freq: Once | INTRAVENOUS | Status: AC
  Administered 2024-04-19: 7.5 mg via INTRAVENOUS
  Filled 2024-04-19: qty 7.5

## 2024-04-19 MED ORDER — SODIUM CHLORIDE 0.9 % IV SOLN: INTRAVENOUS | Status: DC

## 2024-04-19 NOTE — Patient Instructions (Signed)
 [facility] - A DEPT OF [facility]  Discharge Instructions: Thank you for choosing [facility] to provide your oncology and hematology care.   If you have a lab appointment with the Cancer Center, please go directly to the Cancer Center and check in at the registration area.   Wear comfortable clothing and clothing appropriate for easy access to any Portacath or PICC line.   We strive to give you quality time with your provider. You may need to reschedule your appointment if you arrive late (15 or more minutes).  Arriving late affects you and other patients whose appointments are after yours.  Also, if you miss three or more appointments without notifying the office, you may be dismissed from the clinic at the provider's discretion.      For prescription refill requests, have your pharmacy contact our office and allow 72 hours for refills to be completed.    Today you received the following chemotherapy and/or immunotherapy agents: Doxorubicin (Adriamycin), Vinblastine (Velban), Cisplatin (Platinol)      To help prevent nausea and vomiting after your treatment, we encourage you to take your nausea medication as directed.  BELOW ARE SYMPTOMS THAT SHOULD BE REPORTED IMMEDIATELY: *FEVER GREATER THAN 100.4 F (38 C) OR HIGHER *CHILLS OR SWEATING *NAUSEA AND VOMITING THAT IS NOT CONTROLLED WITH YOUR NAUSEA MEDICATION *UNUSUAL SHORTNESS OF BREATH *UNUSUAL BRUISING OR BLEEDING *URINARY PROBLEMS (pain or burning when urinating, or frequent urination) *BOWEL PROBLEMS (unusual diarrhea, constipation, pain near the anus) TENDERNESS IN MOUTH AND THROAT WITH OR WITHOUT PRESENCE OF ULCERS (sore throat, sores in mouth, or a toothache) UNUSUAL RASH, SWELLING OR PAIN  UNUSUAL VAGINAL DISCHARGE OR ITCHING   Items with * indicate a potential emergency and should be followed up as soon as possible or go to the Emergency Department if any problems should  occur.  Please show the CHEMOTHERAPY ALERT CARD or IMMUNOTHERAPY ALERT CARD at check-in to the Emergency Department and triage nurse.  Should you have questions after your visit or need to cancel or reschedule your appointment, please contact [facility]  Dept: [phone]  and follow the prompts.  Office hours are 8:00 a.m. to 4:30 p.m. Monday - Friday. Please note that voicemails left after 4:00 p.m. may not be returned until the following business day.  We are closed weekends and major holidays. You have access to a nurse at all times for urgent questions. Please call the main number to the clinic Dept: [phone] and follow the prompts.   For any non-urgent questions, you may also contact your provider using MyChart. We now offer e-Visits for anyone 54 and older to request care online for non-urgent symptoms. For details visit [email or web address].   Also download the MyChart app! Go to the app store, search MyChart, open the app, select [place], and log in with your MyChart username and password.

## 2024-04-20 ENCOUNTER — Encounter: Payer: Self-pay | Admitting: Urology

## 2024-04-21 ENCOUNTER — Inpatient Hospital Stay

## 2024-04-21 ENCOUNTER — Ambulatory Visit

## 2024-04-21 VITALS — BP 142/72 | HR 95 | Temp 98.2°F | Resp 16

## 2024-04-21 DIAGNOSIS — C679 Malignant neoplasm of bladder, unspecified: Secondary | ICD-10-CM

## 2024-04-21 DIAGNOSIS — Z5111 Encounter for antineoplastic chemotherapy: Secondary | ICD-10-CM | POA: Diagnosis not present

## 2024-04-21 MED ORDER — PEGFILGRASTIM-JMDB 6 MG/0.6ML ~~LOC~~ SOSY
6.0000 mg | PREFILLED_SYRINGE | Freq: Once | SUBCUTANEOUS | Status: AC
  Administered 2024-04-21: 6 mg via SUBCUTANEOUS
  Filled 2024-04-21: qty 0.6

## 2024-04-27 ENCOUNTER — Other Ambulatory Visit: Payer: Self-pay

## 2024-04-28 ENCOUNTER — Other Ambulatory Visit: Payer: Self-pay

## 2024-05-02 ENCOUNTER — Inpatient Hospital Stay

## 2024-05-02 ENCOUNTER — Inpatient Hospital Stay (HOSPITAL_BASED_OUTPATIENT_CLINIC_OR_DEPARTMENT_OTHER): Admitting: Nurse Practitioner

## 2024-05-02 ENCOUNTER — Encounter: Payer: Self-pay | Admitting: Nurse Practitioner

## 2024-05-02 VITALS — BP 138/80 | HR 60 | Temp 98.7°F | Resp 17 | Wt 271.5 lb

## 2024-05-02 DIAGNOSIS — C679 Malignant neoplasm of bladder, unspecified: Secondary | ICD-10-CM

## 2024-05-02 DIAGNOSIS — Z5111 Encounter for antineoplastic chemotherapy: Secondary | ICD-10-CM | POA: Diagnosis not present

## 2024-05-02 LAB — CMP (CANCER CENTER ONLY)
ALT: 15 U/L (ref 0–44)
AST: 16 U/L (ref 15–41)
Albumin: 4.3 g/dL (ref 3.5–5.0)
Alkaline Phosphatase: 59 U/L (ref 38–126)
Anion gap: 7 (ref 5–15)
BUN: 19 mg/dL (ref 6–20)
CO2: 27 mmol/L (ref 22–32)
Calcium: 10.4 mg/dL — ABNORMAL HIGH (ref 8.9–10.3)
Chloride: 104 mmol/L (ref 98–111)
Creatinine: 0.8 mg/dL (ref 0.61–1.24)
GFR, Estimated: >60 mL/min (ref >60)
Glucose, Bld: 119 mg/dL — ABNORMAL HIGH (ref 70–99)
Potassium: 4 mmol/L (ref 3.5–5.1)
Sodium: 138 mmol/L (ref 135–145)
Total Bilirubin: 0.3 mg/dL (ref 0.0–1.2)
Total Protein: 7.1 g/dL (ref 6.5–8.1)

## 2024-05-02 LAB — CBC WITH DIFFERENTIAL (CANCER CENTER ONLY)
Abs Immature Granulocytes: 0.5 K/uL — ABNORMAL HIGH (ref 0.00–0.07)
Band Neutrophils: 3 %
Basophils Absolute: 0.1 K/uL (ref 0.0–0.1)
Basophils Relative: 1 %
Eosinophils Absolute: 0 K/uL (ref 0.0–0.5)
Eosinophils Relative: 0 %
HCT: 39.6 % (ref 39.0–52.0)
Hemoglobin: 13.4 g/dL (ref 13.0–17.0)
Lymphocytes Relative: 20 %
Lymphs Abs: 2.5 K/uL (ref 0.7–4.0)
MCH: 30.2 pg (ref 26.0–34.0)
MCHC: 33.8 g/dL (ref 30.0–36.0)
MCV: 89.4 fL (ref 80.0–100.0)
Metamyelocytes Relative: 4 %
Monocytes Absolute: 0.8 K/uL (ref 0.1–1.0)
Monocytes Relative: 6 %
Neutro Abs: 8.7 K/uL — ABNORMAL HIGH (ref 1.7–7.7)
Neutrophils Relative %: 66 %
Platelet Count: 183 K/uL (ref 150–400)
RBC: 4.43 MIL/uL (ref 4.22–5.81)
RDW: 13.2 % (ref 11.5–15.5)
Smear Review: NORMAL
WBC Count: 12.6 K/uL — ABNORMAL HIGH (ref 4.0–10.5)
nRBC: 0.2 % (ref 0.0–0.2)

## 2024-05-02 LAB — MAGNESIUM: Magnesium: 1.9 mg/dL (ref 1.7–2.4)

## 2024-05-02 MED ORDER — ONDANSETRON HCL 4 MG/2ML IJ SOLN
8.0000 mg | Freq: Once | INTRAMUSCULAR | Status: AC
  Administered 2024-05-02: 8 mg via INTRAVENOUS
  Filled 2024-05-02: qty 4

## 2024-05-02 MED ORDER — DEXAMETHASONE SOD PHOSPHATE PF 10 MG/ML IJ SOLN
10.0000 mg | Freq: Once | INTRAMUSCULAR | Status: AC
  Administered 2024-05-02: 10 mg via INTRAVENOUS

## 2024-05-02 MED ORDER — METHOTREXATE SODIUM CHEMO INJECTION (PF) 50 MG/2ML
30.1000 mg/m2 | Freq: Once | INTRAMUSCULAR | Status: AC
  Administered 2024-05-02: 75 mg via INTRAVENOUS
  Filled 2024-05-02: qty 3

## 2024-05-02 MED ORDER — SODIUM CHLORIDE 0.9 % IV SOLN: INTRAVENOUS | Status: DC

## 2024-05-02 MED FILL — Fosaprepitant Dimeglumine For IV Infusion 150 MG (Base Eq): INTRAVENOUS | Qty: 5 | Status: AC

## 2024-05-02 NOTE — Patient Instructions (Signed)
 [facility] - A DEPT OF [facility]  Discharge Instructions: Thank you for choosing [facility] to provide your oncology and hematology care.   If you have a lab appointment with the Cancer Center, please go directly to the Cancer Center and check in at the registration area.   Wear comfortable clothing and clothing appropriate for easy access to any Portacath or PICC line.   We strive to give you quality time with your provider. You may need to reschedule your appointment if you arrive late (15 or more minutes).  Arriving late affects you and other patients whose appointments are after yours.  Also, if you miss three or more appointments without notifying the office, you may be dismissed from the clinic at the provider's discretion.      For prescription refill requests, have your pharmacy contact our office and allow 72 hours for refills to be completed.    Today you received the following chemotherapy and/or immunotherapy agent: Methotrexate   To help prevent nausea and vomiting after your treatment, we encourage you to take your nausea medication as directed.  BELOW ARE SYMPTOMS THAT SHOULD BE REPORTED IMMEDIATELY: *FEVER GREATER THAN 100.4 F (38 C) OR HIGHER *CHILLS OR SWEATING *NAUSEA AND VOMITING THAT IS NOT CONTROLLED WITH YOUR NAUSEA MEDICATION *UNUSUAL SHORTNESS OF BREATH *UNUSUAL BRUISING OR BLEEDING *URINARY PROBLEMS (pain or burning when urinating, or frequent urination) *BOWEL PROBLEMS (unusual diarrhea, constipation, pain near the anus) TENDERNESS IN MOUTH AND THROAT WITH OR WITHOUT PRESENCE OF ULCERS (sore throat, sores in mouth, or a toothache) UNUSUAL RASH, SWELLING OR PAIN  UNUSUAL VAGINAL DISCHARGE OR ITCHING   Items with * indicate a potential emergency and should be followed up as soon as possible or go to the Emergency Department if any problems should occur.  Please show the CHEMOTHERAPY ALERT CARD or IMMUNOTHERAPY  ALERT CARD at check-in to the Emergency Department and triage nurse.  Should you have questions after your visit or need to cancel or reschedule your appointment, please contact [name]  Dept: [phone]  and follow the prompts.  Office hours are 8:00 a.m. to 4:30 p.m. Monday - Friday. Please note that voicemails left after 4:00 p.m. may not be returned until the following business day.  We are closed weekends and major holidays. You have access to a nurse at all times for urgent questions. Please call the main number to the clinic Dept: [phone] and follow the prompts.   For any non-urgent questions, you may also contact your provider using MyChart. We now offer e-Visits for anyone 57 and older to request care online for non-urgent symptoms. For details visit [email or web address].   Also download the MyChart app! Go to the app store, search MyChart, open the app, select , and log in with your MyChart username and password.  Methotrexate Injection What is this medication? METHOTREXATE (METH oh TREX ate) treats inflammatory conditions such as arthritis and psoriasis. It works by decreasing inflammation, which can reduce pain and prevent long-term injury to the joints and skin. It may also be used to treat some types of cancer. It works by slowing down the growth of cancer cells. This medicine may be used for other purposes; ask your health care provider or pharmacist if you have questions. What should I tell my care team before I take this medication? They need to know if you  have any of these conditions: Fluid in the stomach area or lungs Frequently drink alcohol Infection or immune system problems Kidney disease Liver disease Low blood counts (white cells, platelets, or red blood cells) Lung disease Recent or ongoing radiation Recent or upcoming vaccine Stomach ulcers Ulcerative colitis An unusual or allergic  reaction to methotrexate, other medications, foods, dyes, or preservatives Pregnant or trying to get pregnant Breastfeeding How should I use this medication? This medication is for infusion into a vein or for injection into muscle or into the spinal fluid (whichever applies). It is usually given in a hospital or clinic setting. In rare cases, you might get this medication at home. You will be taught how to give this medication. Use exactly as directed. Take your medication at regular intervals. Do not take your medication more often than directed. If this medication is used for arthritis or psoriasis, it should be taken weekly, NOT daily. It is important that you put your used needles and syringes in a special sharps container. Do not put them in a trash can. If you do not have a sharps container, call your pharmacist or care team to get one. Talk to your care team about the use of this medication in children. While this medication may be prescribed for children as young as 2 years for selected conditions, precautions do apply. Overdosage: If you think you have taken too much of this medicine contact a poison control center or emergency room at once. NOTE: This medicine is only for you. Do not share this medicine with others. What if I miss a dose? It is important not to miss your dose. Call your care team if you are unable to keep an appointment. If you give yourself the medication, and you miss a dose, talk with your care team. Do not take double or extra doses. What may interact with this medication? Do not take this medication with any of the following: Acitretin Probenecid This medication may also interact with the following: Aspirin  or aspirin -like medications Azathioprine Certain antibiotics, such as gentamicin, penicillin, tetracycline, vancomycin  Certain medications that treat or prevent blood clots, such as warfarin, apixaban, dabigatran, rivaroxaban Certain medications for stomach  problems, such as esomeprazole, omeprazole, pantoprazole Dapsone Hydroxychloroquine Live virus vaccines Medications for viral infections, such as acyclovir, cidofovir, foscarnet, ganciclovir Mercaptopurine NSAIDs, medications for pain and inflammation, such as ibuprofen  or naproxen Phenytoin Pyrimethamine Retinoids, such as isotretinoin or tretinoin Sulfonamides, such as sulfasalazine or trimethoprim; sulfamethoxazole Theophylline This list may not describe all possible interactions. Give your health care provider a list of all the medicines, herbs, non-prescription drugs, or dietary supplements you use. Also tell them if you smoke, drink alcohol, or use illegal drugs. Some items may interact with your medicine. What should I watch for while using this medication? This medication may make you feel generally unwell. This is not uncommon as chemotherapy can affect healthy cells as well as cancer cells. Report any side effects. Continue your course of treatment even though you feel ill unless your care team tells you to stop. Your condition will be monitored carefully while you are receiving this medication. Avoid alcoholic drinks. This medication can cause serious side effects. To reduce the risk, your care team may give you other medications to take before receiving this one. Be sure to follow the directions from your care team. This medication can make you more sensitive to the sun. Keep out of the sun. If you cannot avoid being in the sun, wear protective clothing  and use sunscreen. Do not use sun lamps or tanning beds/booths. You may get drowsy or dizzy. Do not drive, use machinery, or do anything that needs mental alertness until you know how this medication affects you. Do not stand or sit up quickly, especially if you are an older patient. This reduces the risk of dizzy or fainting spells. You may need blood work while you are taking this medication. Call your care team for advice if you  get a fever, chills or sore throat, or other symptoms of a cold or flu. Do not treat yourself. This medication decreases your body's ability to fight infections. Try to avoid being around people who are sick. This medication may increase your risk to bruise or bleed. Call your care team if you notice any unusual bleeding. Be careful brushing or flossing your teeth or using a toothpick because you may get an infection or bleed more easily. If you have any dental work done, tell your dentist you are receiving this medication Check with your care team if you get an attack of severe diarrhea, nausea and vomiting, or if you sweat a lot. The loss of too much body fluid can make it dangerous for you to take this medication. Talk to your care team about your risk of cancer. You may be more at risk for certain types of cancers if you take this medication. Do not become pregnant while taking this medication or for 6 months after stopping it. Women should inform their care team if they wish to become pregnant or think they might be pregnant. Men should not father a child while taking this medication and for 3 months after stopping it. There is potential for serious harm to an unborn child. Talk to your care team for more information. Do not breast-feed an infant while taking this medication or for 1 week after stopping it. This medication may make it more difficult to get pregnant or father a child. Talk to your care team if you are concerned about your fertility. What side effects may I notice from receiving this medication? Side effects that you should report to your care team as soon as possible: Allergic reactions--skin rash, itching, hives, swelling of the face, lips, tongue, or throat Blood clot--pain, swelling, or warmth in the leg, shortness of breath, chest pain Dry cough, shortness of breath or trouble breathing Infection--fever, chills, cough, sore throat, wounds that don't heal, pain or trouble when  passing urine, general feeling of discomfort or being unwell Kidney injury--decrease in the amount of urine, swelling of the ankles, hands, or feet Liver injury--right upper belly pain, loss of appetite, nausea, light-colored stool, dark yellow or brown urine, yellowing of the skin or eyes, unusual weakness or fatigue Low red blood cell count--unusual weakness or fatigue, dizziness, headache, trouble breathing Redness, blistering, peeling, or loosening of the skin, including inside the mouth Seizures Unusual bruising or bleeding Side effects that usually do not require medical attention (report to your care team if they continue or are bothersome): Diarrhea Dizziness Hair loss Nausea Pain, redness, or swelling with sores inside the mouth or throat Vomiting This list may not describe all possible side effects. Call your doctor for medical advice about side effects. You may report side effects to FDA at 1-800-FDA-1088. Where should I keep my medication? This medication is given in a hospital or clinic. It will not be stored at home. NOTE: This sheet is a summary. It may not cover all possible information. If you have  questions about this medicine, talk to your doctor, pharmacist, or health care provider.  2024 Elsevier/Gold Standard (2022-12-03 00:00:00)

## 2024-05-02 NOTE — Progress Notes (Signed)
 [facility]   Telephone:(336) [phone] Fax:(336) [phone]    Patient Care Team: Sebastian Beverley NOVAK, MD as PCP - General (Family Medicine) Shlomo Wilbert SAUNDERS, MD as PCP - Cardiology (Cardiology)   CHIEF COMPLAINT: Follow-up urothelial cancer  Oncology History  Bladder cancer [facility])  02/11/2024 Imaging   CT hematuria study 1. Contrast enhancing endoluminal mass of the right aspect of the urinary bladder measuring 5.4 x 2.9 cm with scattered associated calcifications and retraction of the bladder wall well as some adjacent fat stranding. 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  2. No evidence of lymphadenopathy or metastatic disease in the abdomen or pelvis. 3. Punctuate nonobstructive calculus of the midportion of the right kidney. No left-sided calculi, ureteral calculi, or hydronephrosis. 4. Hepatomegaly and hepatic steatosis. 5. Coronary artery disease.   03/07/2024 Pathology Results   Pathology A. BLADDER TUMOR, TURBT:  Invasive High-grade urothelial carcinoma with giant cells (20%), poorly  differentiated cells (10%) and squamous cell (30%) components.  The carcinoma invades muscularis propria (detrusor muscle)    03/17/2024 Initial Diagnosis   Bladder cancer (HCC)   04/18/2024 -  Chemotherapy   Patient is on Treatment Plan : BLADDER DOSE DENSE MVAC q14d        CURRENT THERAPY: Neoadjuvant ddMVAC  INTERVAL HISTORY Mr Casteneda returns for follow-up and treatment.  On treatment day 2 he developed hiccups, day 3-4 well-controlled nausea without vomiting with Compazine , days 5 - 7 were challenging with low p.o. intake and fatigue.  Was walking daily except those days.  He eventually recovered, increased p.o. intake and walking again.  Had some alterations in his sleep patterns.  Felt very well for the past 3-4 days.  Denies dysuria or hematuria.  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     Cancer [facility])    Bladder   CHF (congestive heart failure) (HCC)    Patient denies   Chicken pox    Heart murmur August 2025   Test scheduled Nov 2025   Hyperlipidemia with target LDL less than 70 10/19/2015   Hypertension    MI, old    Obesity (BMI 30-39.9) 10/19/2015   OSA (obstructive sleep apnea) 10/19/2015   Pre-diabetes    Sleep apnea      Past Surgical History:  Procedure Laterality Date   BLADDER INSTILLATION N/A 03/07/2024   Procedure: INSTILLATION, BLADDER;  Surgeon: Matilda Senior, MD;  Location: [facility];  Service: Urology;  Laterality: N/A;   CARDIAC CATHETERIZATION  2015   LHC w/ BMS x2 LCx and LAD  done in [place]    COLONOSCOPY     x1   EYE SURGERY Right 2014   Laser repair   [facility] at [facility]   IR IMAGING GUIDED PORT INSERTION  04/04/2024   KNEE SURGERY Right 2000   torn Meniscus repair   done in [place]    NASAL SINUS SURGERY     ORIF ANKLE FRACTURE Right 09/20/2021   Procedure: OPEN REDUCTION INTERNAL FIXATION (ORIF) ANKLE FRACTURE;  Surgeon: Shari Sieving, MD;  Location: [facility];  Service: Orthopedics;  Laterality: Right;  VASECTOMY  2007     Outpatient Encounter Medications as of 05/02/2024  Medication Sig Note   aspirin  EC 81 MG tablet Take 81 mg by mouth daily. Swallow whole.    Collagen-Vitamin C-Biotin (COLLAGEN PO) Take 2 Doses by mouth daily.    dexamethasone  (DECADRON ) 4 MG tablet Take 2 tablets (8 mg) by mouth daily x 3 days starting the day after cisplatin chemotherapy. Take with food. 04/04/2024: Has not started yet   lidocaine -prilocaine  (EMLA ) cream Apply to affected area once 04/04/2024: Has not started yet   lisinopril  (ZESTRIL ) 5 MG tablet Take 1 tablet (5 mg total) by  mouth daily.    ondansetron  (ZOFRAN ) 8 MG tablet Take 1 tablet (8 mg total) by mouth every 8 (eight) hours as needed for nausea or vomiting. Start on the third day after cisplatin. 04/04/2024: Has not started yet   oxyCODONE  (ROXICODONE ) 5 MG immediate release tablet Take 1 tablet (5 mg total) by mouth every 4 (four) hours as needed for severe pain (pain score 7-10).    prochlorperazine  (COMPAZINE ) 10 MG tablet Take 1 tablet (10 mg total) by mouth every 6 (six) hours as needed (Nausea or vomiting). 04/04/2024: Has not started yet   rosuvastatin  (CRESTOR ) 40 MG tablet TAKE 1 TABLET BY MOUTH DAILY    Facility-Administered Encounter Medications as of 05/02/2024  Medication   0.9 %  sodium chloride  infusion     Today's Vitals   05/02/24 1216 05/02/24 1221  BP: 138/80   Pulse: 60   Resp: 17   Temp: 98.7 F (37.1 C)   SpO2: 97%   Weight: 271 lb 8 oz (123.2 kg)   PainSc:  0-No pain   Body mass index is 37.87 kg/m.   ECOG PERFORMANCE STATUS: 1 - Symptomatic but completely ambulatory  PHYSICAL EXAM GENERAL:alert, no distress and comfortable SKIN: no rash  HEENT:  sclera clear.  No thrush or ulcers NECK: without mass LYMPH:  no palpable cervical or supraclavicular lymphadenopathy  LUNGS: clear with normal breathing effort HEART: regular rate & rhythm, no lower extremity edema ABDOMEN: abdomen soft, non-tender and normal bowel sounds NEURO: alert & oriented x 3 with fluent speech, no focal motor/sensory deficits PAC without erythema    CBC    Latest Ref Rng & Units 05/02/2024   11:48 AM 04/18/2024    1:10 PM 03/18/2024   11:15 AM  CBC  WBC 4.0 - 10.5 K/uL 12.6  7.0  6.8   Hemoglobin 13.0 - 17.0 g/dL 86.5  85.5  87.0   Hematocrit 39.0 - 52.0 % 39.6  41.9  38.1   Platelets 150 - 400 K/uL 183  162  175       CMP     Latest Ref Rng & Units 05/02/2024   11:48 AM 04/18/2024    1:10 PM 03/18/2024   11:15 AM  CMP  Glucose 70 - 99 mg/dL 880  898  893   BUN 6 - 20 mg/dL 19  19  15     Creatinine 0.61 - 1.24 mg/dL 9.19  9.13  9.21   Sodium 135 - 145 mmol/L 138  138  141   Potassium 3.5 - 5.1 mmol/L 4.0  4.0  4.7   Chloride 98 - 111 mmol/L 104  104  107   CO2 22 - 32 mmol/L 27  27  31    Calcium  8.9 - 10.3 mg/dL 89.5  9.8  9.8   Total Protein 6.5 - 8.1 g/dL 7.1  7.6  7.4  Total Bilirubin 0.0 - 1.2 mg/dL 0.3  0.7  0.5   Alkaline Phos 38 - 126 U/L 59  46  41   AST 15 - 41 U/L 16  23  17    ALT 0 - 44 U/L 15  23  19        ASSESSMENT & PLAN: 62 y.o.male with history of tobacco abuse, HTN, CAD, MI, hyperlipidemia and sleep apnea    Urothelial cancer, [id number] Invasive High-grade urothelial carcinoma with giant cells (20%), poorly differentiated cells (10%) and squamous cell (30%) components.  -Outside pathology suggested invasion into the lamina propria. Imaging most consistent with clinical T3. Dr. Kinnie also concur with this. -Recommendation is for 4-6 cycles ddMVAC starting 04/18/24, followed by cystectomy. Dr. Eva Kinnie will perform at [facility] (scheduled 08/03/2024) -Mr. Dewing peers stable, s/p cycle 1 ddMVAC, tolerated well with hiccups, nausea, fatigue, low activity, and decreased p.o. intake.  Side effects are adequately managed with supportive care at home.  He is able to recover well after a week and maintain good performance status.  There is no clinical evidence of disease progression   PLAN: - Labs reviewed, adequate to proceed with cycle 2 ddMVAC, no dose modifications - Follow-up and cycle 3 in 2 weeks, or sooner if needed    All questions were answered. The patient knows to call the clinic with any problems, questions or concerns. No barriers to learning were detected.  Garo Heidelberg K Tyshae Stair, NP 05/02/2024

## 2024-05-03 ENCOUNTER — Inpatient Hospital Stay

## 2024-05-03 VITALS — BP 117/57 | HR 59 | Temp 97.7°F | Resp 16

## 2024-05-03 DIAGNOSIS — Z5111 Encounter for antineoplastic chemotherapy: Secondary | ICD-10-CM | POA: Diagnosis not present

## 2024-05-03 DIAGNOSIS — C679 Malignant neoplasm of bladder, unspecified: Secondary | ICD-10-CM

## 2024-05-03 MED ORDER — DEXAMETHASONE SOD PHOSPHATE PF 10 MG/ML IJ SOLN
10.0000 mg | Freq: Once | INTRAMUSCULAR | Status: AC
  Administered 2024-05-03: 10 mg via INTRAVENOUS

## 2024-05-03 MED ORDER — POTASSIUM CHLORIDE IN NACL 20-0.9 MEQ/L-% IV SOLN
Freq: Once | INTRAVENOUS | Status: AC
  Filled 2024-05-03: qty 1000

## 2024-05-03 MED ORDER — SODIUM CHLORIDE 0.9 % IV SOLN
70.0000 mg/m2 | Freq: Once | INTRAVENOUS | Status: AC
  Administered 2024-05-03: 174 mg via INTRAVENOUS
  Filled 2024-05-03: qty 174

## 2024-05-03 MED ORDER — MAGNESIUM SULFATE 2 GM/50ML IV SOLN
2.0000 g | Freq: Once | INTRAVENOUS | Status: AC
  Administered 2024-05-03: 2 g via INTRAVENOUS
  Filled 2024-05-03: qty 50

## 2024-05-03 MED ORDER — SODIUM CHLORIDE 0.9 % IV SOLN: INTRAVENOUS | Status: DC

## 2024-05-03 MED ORDER — VINBLASTINE SULFATE CHEMO INJECTION 1 MG/ML
3.0000 mg/m2 | Freq: Once | INTRAVENOUS | Status: AC
  Administered 2024-05-03: 7.5 mg via INTRAVENOUS
  Filled 2024-05-03: qty 7.5

## 2024-05-03 MED ORDER — PALONOSETRON HCL INJECTION 0.25 MG/5ML
0.2500 mg | Freq: Once | INTRAVENOUS | Status: AC
  Administered 2024-05-03: 0.25 mg via INTRAVENOUS
  Filled 2024-05-03: qty 5

## 2024-05-03 MED ORDER — DOXORUBICIN HCL CHEMO IV INJECTION 2 MG/ML
30.0000 mg/m2 | Freq: Once | INTRAVENOUS | Status: AC
  Administered 2024-05-03: 74 mg via INTRAVENOUS
  Filled 2024-05-03: qty 37

## 2024-05-03 MED ORDER — SODIUM CHLORIDE 0.9 % IV SOLN
150.0000 mg | Freq: Once | INTRAVENOUS | Status: AC
  Administered 2024-05-03: 150 mg via INTRAVENOUS
  Filled 2024-05-03: qty 150

## 2024-05-03 NOTE — Patient Instructions (Signed)
 [facility] - A DEPT OF [facility]  Discharge Instructions: Thank you for choosing [facility] to provide your oncology and hematology care.   If you have a lab appointment with the Cancer Center, please go directly to the Cancer Center and check in at the registration area.   Wear comfortable clothing and clothing appropriate for easy access to any Portacath or PICC line.   We strive to give you quality time with your provider. You may need to reschedule your appointment if you arrive late (15 or more minutes).  Arriving late affects you and other patients whose appointments are after yours.  Also, if you miss three or more appointments without notifying the office, you may be dismissed from the clinic at the provider's discretion.      For prescription refill requests, have your pharmacy contact our office and allow 72 hours for refills to be completed.    Today you received the following chemotherapy and/or immunotherapy agents: Doxorubicin (Adriamycin), Vinblastine (Velban), Cisplatin (Platinol)      To help prevent nausea and vomiting after your treatment, we encourage you to take your nausea medication as directed.  BELOW ARE SYMPTOMS THAT SHOULD BE REPORTED IMMEDIATELY: *FEVER GREATER THAN 100.4 F (38 C) OR HIGHER *CHILLS OR SWEATING *NAUSEA AND VOMITING THAT IS NOT CONTROLLED WITH YOUR NAUSEA MEDICATION *UNUSUAL SHORTNESS OF BREATH *UNUSUAL BRUISING OR BLEEDING *URINARY PROBLEMS (pain or burning when urinating, or frequent urination) *BOWEL PROBLEMS (unusual diarrhea, constipation, pain near the anus) TENDERNESS IN MOUTH AND THROAT WITH OR WITHOUT PRESENCE OF ULCERS (sore throat, sores in mouth, or a toothache) UNUSUAL RASH, SWELLING OR PAIN  UNUSUAL VAGINAL DISCHARGE OR ITCHING   Items with * indicate a potential emergency and should be followed up as soon as possible or go to the Emergency Department if any problems should  occur.  Please show the CHEMOTHERAPY ALERT CARD or IMMUNOTHERAPY ALERT CARD at check-in to the Emergency Department and triage nurse.  Should you have questions after your visit or need to cancel or reschedule your appointment, please contact [facility]  Dept: [phone]  and follow the prompts.  Office hours are 8:00 a.m. to 4:30 p.m. Monday - Friday. Please note that voicemails left after 4:00 p.m. may not be returned until the following business day.  We are closed weekends and major holidays. You have access to a nurse at all times for urgent questions. Please call the main number to the clinic Dept: [phone] and follow the prompts.   For any non-urgent questions, you may also contact your provider using MyChart. We now offer e-Visits for anyone 54 and older to request care online for non-urgent symptoms. For details visit [email or web address].   Also download the MyChart app! Go to the app store, search MyChart, open the app, select [place], and log in with your MyChart username and password.

## 2024-05-05 ENCOUNTER — Inpatient Hospital Stay

## 2024-05-05 VITALS — BP 144/61 | HR 54 | Temp 97.9°F | Resp 14

## 2024-05-05 DIAGNOSIS — Z5111 Encounter for antineoplastic chemotherapy: Secondary | ICD-10-CM | POA: Diagnosis not present

## 2024-05-05 DIAGNOSIS — C679 Malignant neoplasm of bladder, unspecified: Secondary | ICD-10-CM

## 2024-05-05 MED ORDER — PEGFILGRASTIM-JMDB 6 MG/0.6ML ~~LOC~~ SOSY
6.0000 mg | PREFILLED_SYRINGE | Freq: Once | SUBCUTANEOUS | Status: AC
  Administered 2024-05-05: 6 mg via SUBCUTANEOUS
  Filled 2024-05-05: qty 0.6

## 2024-05-06 ENCOUNTER — Inpatient Hospital Stay: Admitting: Licensed Clinical Social Worker

## 2024-05-13 NOTE — Progress Notes (Signed)
 [facility] OFFICE PROGRESS NOTE  Patient Care Team: Sebastian Beverley NOVAK, MD as PCP - General (Family Medicine) Shlomo Wilbert SAUNDERS, MD as PCP - Cardiology (Cardiology)  Peter Oconnor is a 63 y.o.male with history of tobacco abuse, HTN, CAD, MI, hyperlipidemia and sleep apnea  [facility].    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  Treatment: 04/18/24 start cycle 1 day 1 ddMVAC  Complete 2 cycles.  Surgery will be on 1/21.   Will proceed with cycle 3.  Return in 2 weeks.  If able to proceed with cycle 4 on time, may cancel December appointment. Assessment & Plan Malignant neoplasm of urinary bladder, unspecified site (HCC) Cycle 3 ddMVAC today Follow up with C2 Neutropenic and thrombocytopenic precautions Advised to consume adequate fiber, fluid intake, prevent constipation which he has already been doing so Thrombocytopenia Mild.  Continue monitor Low platelet precautions. Nausea without vomiting Continue Compazine  as scheduled during the first few days of treatment Refill Compazine  today. Peripheral polyneuropathy Baseline right lower extremity worse than left.  Unchanged.     Peter JAYSON Chihuahua, MD  INTERVAL HISTORY: Patient returns for follow-up.  Cycle 2 was better than cycle 1.  He was able to control nausea with Compazine .  Fatigue after first few days.  Loss of appetite and taste.  Improvement towards the end of second week.  He is able to exercise.  No fever or chills or signs of infection.  No diarrhea.  Bowel movement about 5 times a week.  He is able to manage this.  No trouble urinating.  No other symptoms.  Oncology History  Bladder cancer (HCC)  02/11/2024 Imaging   CT hematuria study 1. Contrast enhancing endoluminal mass of the right aspect of the urinary bladder measuring 5.4 x 2.9 cm with scattered  associated calcifications and retraction of the bladder wall well as some adjacent fat stranding. 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  2. No evidence of lymphadenopathy or metastatic disease in the abdomen or pelvis. 3. Punctuate nonobstructive calculus of the midportion of the right kidney. No left-sided calculi, ureteral calculi, or hydronephrosis. 4. Hepatomegaly and hepatic steatosis. 5. Coronary artery disease.   03/07/2024 Pathology Results   Pathology A. BLADDER TUMOR, TURBT:  Invasive High-grade urothelial carcinoma with giant cells (20%), poorly  differentiated cells (10%) and squamous cell (30%) components.  The carcinoma invades muscularis propria (detrusor muscle)    03/17/2024 Initial Diagnosis   Bladder cancer (HCC)   04/18/2024 -  Chemotherapy   Patient is on Treatment Plan : BLADDER DOSE DENSE MVAC q14d        PHYSICAL EXAMINATION: ECOG PERFORMANCE STATUS: 0 - Asymptomatic  Vitals:   05/16/24 1105  BP: 130/69  Pulse: 67  Resp: 20  Temp: (!) 97 F (36.1 C)  SpO2: 97%   Filed Weights   05/16/24 1105  Weight: 271 lb 14.4 oz (123.3 kg)    GENERAL: alert, no distress and comfortable SKIN: skin color normal and no jaundice  LUNGS: clear to auscultation and no wheeze or rales with normal breathing effort HEART: regular rate & rhythm  ABDOMEN: abdomen soft, non-tender and nondistended. Musculoskeletal: no edema   Relevant data reviewed during this visit included labs.

## 2024-05-16 ENCOUNTER — Inpatient Hospital Stay

## 2024-05-16 VITALS — BP 130/69 | HR 67 | Temp 97.0°F | Resp 20 | Wt 271.9 lb

## 2024-05-16 DIAGNOSIS — Z79899 Other long term (current) drug therapy: Secondary | ICD-10-CM | POA: Insufficient documentation

## 2024-05-16 DIAGNOSIS — C679 Malignant neoplasm of bladder, unspecified: Secondary | ICD-10-CM | POA: Insufficient documentation

## 2024-05-16 DIAGNOSIS — D696 Thrombocytopenia, unspecified: Secondary | ICD-10-CM | POA: Insufficient documentation

## 2024-05-16 DIAGNOSIS — G629 Polyneuropathy, unspecified: Secondary | ICD-10-CM | POA: Diagnosis not present

## 2024-05-16 DIAGNOSIS — Z5111 Encounter for antineoplastic chemotherapy: Secondary | ICD-10-CM | POA: Diagnosis present

## 2024-05-16 DIAGNOSIS — R110 Nausea: Secondary | ICD-10-CM | POA: Diagnosis not present

## 2024-05-16 LAB — CBC WITH DIFFERENTIAL (CANCER CENTER ONLY)
Abs Immature Granulocytes: 2.68 K/uL — ABNORMAL HIGH (ref 0.00–0.07)
Basophils Absolute: 0.2 K/uL — ABNORMAL HIGH (ref 0.0–0.1)
Basophils Relative: 1 %
Eosinophils Absolute: 0 K/uL (ref 0.0–0.5)
Eosinophils Relative: 0 %
HCT: 35.5 % — ABNORMAL LOW (ref 39.0–52.0)
Hemoglobin: 12.2 g/dL — ABNORMAL LOW (ref 13.0–17.0)
Immature Granulocytes: 14 %
Lymphocytes Relative: 13 %
Lymphs Abs: 2.3 K/uL (ref 0.7–4.0)
MCH: 30.4 pg (ref 26.0–34.0)
MCHC: 34.4 g/dL (ref 30.0–36.0)
MCV: 88.5 fL (ref 80.0–100.0)
Monocytes Absolute: 1.1 K/uL — ABNORMAL HIGH (ref 0.1–1.0)
Monocytes Relative: 6 %
Neutro Abs: 12.3 K/uL — ABNORMAL HIGH (ref 1.7–7.7)
Neutrophils Relative %: 66 %
Platelet Count: 133 K/uL — ABNORMAL LOW (ref 150–400)
RBC: 4.01 MIL/uL — ABNORMAL LOW (ref 4.22–5.81)
RDW: 14 % (ref 11.5–15.5)
Smear Review: NORMAL
WBC Count: 18.6 K/uL — ABNORMAL HIGH (ref 4.0–10.5)
nRBC: 0.2 % (ref 0.0–0.2)

## 2024-05-16 LAB — CMP (CANCER CENTER ONLY)
ALT: 14 U/L (ref 0–44)
AST: 14 U/L — ABNORMAL LOW (ref 15–41)
Albumin: 4.2 g/dL (ref 3.5–5.0)
Alkaline Phosphatase: 71 U/L (ref 38–126)
Anion gap: 6 (ref 5–15)
BUN: 15 mg/dL (ref 6–20)
CO2: 28 mmol/L (ref 22–32)
Calcium: 9.8 mg/dL (ref 8.9–10.3)
Chloride: 106 mmol/L (ref 98–111)
Creatinine: 0.82 mg/dL (ref 0.61–1.24)
GFR, Estimated: >60 mL/min (ref >60)
Glucose, Bld: 104 mg/dL — ABNORMAL HIGH (ref 70–99)
Potassium: 4.1 mmol/L (ref 3.5–5.1)
Sodium: 140 mmol/L (ref 135–145)
Total Bilirubin: 0.3 mg/dL (ref 0.0–1.2)
Total Protein: 6.9 g/dL (ref 6.5–8.1)

## 2024-05-16 LAB — MAGNESIUM: Magnesium: 1.9 mg/dL (ref 1.7–2.4)

## 2024-05-16 MED ORDER — DEXAMETHASONE SOD PHOSPHATE PF 10 MG/ML IJ SOLN
10.0000 mg | Freq: Once | INTRAMUSCULAR | Status: AC
  Administered 2024-05-16: 10 mg via INTRAVENOUS

## 2024-05-16 MED ORDER — SODIUM CHLORIDE 0.9 % IV SOLN: INTRAVENOUS | Status: DC

## 2024-05-16 MED ORDER — PROCHLORPERAZINE MALEATE 10 MG PO TABS: 10.0000 mg | ORAL_TABLET | Freq: Four times a day (QID) | ORAL | 0 refills | Status: AC | PRN

## 2024-05-16 MED ORDER — METHOTREXATE SODIUM CHEMO INJECTION (PF) 50 MG/2ML
30.1000 mg/m2 | Freq: Once | INTRAMUSCULAR | Status: AC
  Administered 2024-05-16: 75 mg via INTRAVENOUS
  Filled 2024-05-16: qty 3

## 2024-05-16 MED ORDER — ONDANSETRON HCL 4 MG/2ML IJ SOLN
8.0000 mg | Freq: Once | INTRAMUSCULAR | Status: AC
  Administered 2024-05-16: 8 mg via INTRAVENOUS
  Filled 2024-05-16: qty 4

## 2024-05-16 MED FILL — Fosaprepitant Dimeglumine For IV Infusion 150 MG (Base Eq): INTRAVENOUS | Qty: 5 | Status: AC

## 2024-05-16 NOTE — Assessment & Plan Note (Addendum)
 Baseline right lower extremity worse than left.  Unchanged.

## 2024-05-16 NOTE — Patient Instructions (Signed)
 [facility] - A DEPT OF [facility]  Discharge Instructions: Thank you for choosing [facility] to provide your oncology and hematology care.   If you have a lab appointment with the Cancer Center, please go directly to the Cancer Center and check in at the registration area.   Wear comfortable clothing and clothing appropriate for easy access to any Portacath or PICC line.   We strive to give you quality time with your provider. You may need to reschedule your appointment if you arrive late (15 or more minutes).  Arriving late affects you and other patients whose appointments are after yours.  Also, if you miss three or more appointments without notifying the office, you may be dismissed from the clinic at the provider's discretion.      For prescription refill requests, have your pharmacy contact our office and allow 72 hours for refills to be completed.    Today you received the following chemotherapy and/or immunotherapy agent: Methotrexate   To help prevent nausea and vomiting after your treatment, we encourage you to take your nausea medication as directed.  BELOW ARE SYMPTOMS THAT SHOULD BE REPORTED IMMEDIATELY: *FEVER GREATER THAN 100.4 F (38 C) OR HIGHER *CHILLS OR SWEATING *NAUSEA AND VOMITING THAT IS NOT CONTROLLED WITH YOUR NAUSEA MEDICATION *UNUSUAL SHORTNESS OF BREATH *UNUSUAL BRUISING OR BLEEDING *URINARY PROBLEMS (pain or burning when urinating, or frequent urination) *BOWEL PROBLEMS (unusual diarrhea, constipation, pain near the anus) TENDERNESS IN MOUTH AND THROAT WITH OR WITHOUT PRESENCE OF ULCERS (sore throat, sores in mouth, or a toothache) UNUSUAL RASH, SWELLING OR PAIN  UNUSUAL VAGINAL DISCHARGE OR ITCHING   Items with * indicate a potential emergency and should be followed up as soon as possible or go to the Emergency Department if any problems should occur.  Please show the CHEMOTHERAPY ALERT CARD or IMMUNOTHERAPY  ALERT CARD at check-in to the Emergency Department and triage nurse.  Should you have questions after your visit or need to cancel or reschedule your appointment, please contact [facility] - A DEPT OF [facility]  Dept: [phone]  and follow the prompts.  Office hours are 8:00 a.m. to 4:30 p.m. Monday - Friday. Please note that voicemails left after 4:00 p.m. may not be returned until the following business day.  We are closed weekends and major holidays. You have access to a nurse at all times for urgent questions. Please call the main number to the clinic Dept: [phone] and follow the prompts.   For any non-urgent questions, you may also contact your provider using MyChart. We now offer e-Visits for anyone 57 and older to request care online for non-urgent symptoms. For details visit [email or web address].   Also download the MyChart app! Go to the app store, search MyChart, open the app, select , and log in with your MyChart username and password.  Methotrexate Injection What is this medication? METHOTREXATE (METH oh TREX ate) treats inflammatory conditions such as arthritis and psoriasis. It works by decreasing inflammation, which can reduce pain and prevent long-term injury to the joints and skin. It may also be used to treat some types of cancer. It works by slowing down the growth of cancer cells. This medicine may be used for other purposes; ask your health care provider or pharmacist if you have questions. What should I tell my care team before I take this medication? They need to know if you  have any of these conditions: Fluid in the stomach area or lungs Frequently drink alcohol Infection or immune system problems Kidney disease Liver disease Low blood counts (white cells, platelets, or red blood cells) Lung disease Recent or ongoing radiation Recent or upcoming vaccine Stomach ulcers Ulcerative colitis An unusual or allergic  reaction to methotrexate, other medications, foods, dyes, or preservatives Pregnant or trying to get pregnant Breastfeeding How should I use this medication? This medication is for infusion into a vein or for injection into muscle or into the spinal fluid (whichever applies). It is usually given in a hospital or clinic setting. In rare cases, you might get this medication at home. You will be taught how to give this medication. Use exactly as directed. Take your medication at regular intervals. Do not take your medication more often than directed. If this medication is used for arthritis or psoriasis, it should be taken weekly, NOT daily. It is important that you put your used needles and syringes in a special sharps container. Do not put them in a trash can. If you do not have a sharps container, call your pharmacist or care team to get one. Talk to your care team about the use of this medication in children. While this medication may be prescribed for children as young as 2 years for selected conditions, precautions do apply. Overdosage: If you think you have taken too much of this medicine contact a poison control center or emergency room at once. NOTE: This medicine is only for you. Do not share this medicine with others. What if I miss a dose? It is important not to miss your dose. Call your care team if you are unable to keep an appointment. If you give yourself the medication, and you miss a dose, talk with your care team. Do not take double or extra doses. What may interact with this medication? Do not take this medication with any of the following: Acitretin Probenecid This medication may also interact with the following: Aspirin  or aspirin -like medications Azathioprine Certain antibiotics, such as gentamicin, penicillin, tetracycline, vancomycin  Certain medications that treat or prevent blood clots, such as warfarin, apixaban, dabigatran, rivaroxaban Certain medications for stomach  problems, such as esomeprazole, omeprazole, pantoprazole Dapsone Hydroxychloroquine Live virus vaccines Medications for viral infections, such as acyclovir, cidofovir, foscarnet, ganciclovir Mercaptopurine NSAIDs, medications for pain and inflammation, such as ibuprofen  or naproxen Phenytoin Pyrimethamine Retinoids, such as isotretinoin or tretinoin Sulfonamides, such as sulfasalazine or trimethoprim; sulfamethoxazole Theophylline This list may not describe all possible interactions. Give your health care provider a list of all the medicines, herbs, non-prescription drugs, or dietary supplements you use. Also tell them if you smoke, drink alcohol, or use illegal drugs. Some items may interact with your medicine. What should I watch for while using this medication? This medication may make you feel generally unwell. This is not uncommon as chemotherapy can affect healthy cells as well as cancer cells. Report any side effects. Continue your course of treatment even though you feel ill unless your care team tells you to stop. Your condition will be monitored carefully while you are receiving this medication. Avoid alcoholic drinks. This medication can cause serious side effects. To reduce the risk, your care team may give you other medications to take before receiving this one. Be sure to follow the directions from your care team. This medication can make you more sensitive to the sun. Keep out of the sun. If you cannot avoid being in the sun, wear protective clothing  and use sunscreen. Do not use sun lamps or tanning beds/booths. You may get drowsy or dizzy. Do not drive, use machinery, or do anything that needs mental alertness until you know how this medication affects you. Do not stand or sit up quickly, especially if you are an older patient. This reduces the risk of dizzy or fainting spells. You may need blood work while you are taking this medication. Call your care team for advice if you  get a fever, chills or sore throat, or other symptoms of a cold or flu. Do not treat yourself. This medication decreases your body's ability to fight infections. Try to avoid being around people who are sick. This medication may increase your risk to bruise or bleed. Call your care team if you notice any unusual bleeding. Be careful brushing or flossing your teeth or using a toothpick because you may get an infection or bleed more easily. If you have any dental work done, tell your dentist you are receiving this medication Check with your care team if you get an attack of severe diarrhea, nausea and vomiting, or if you sweat a lot. The loss of too much body fluid can make it dangerous for you to take this medication. Talk to your care team about your risk of cancer. You may be more at risk for certain types of cancers if you take this medication. Do not become pregnant while taking this medication or for 6 months after stopping it. Women should inform their care team if they wish to become pregnant or think they might be pregnant. Men should not father a child while taking this medication and for 3 months after stopping it. There is potential for serious harm to an unborn child. Talk to your care team for more information. Do not breast-feed an infant while taking this medication or for 1 week after stopping it. This medication may make it more difficult to get pregnant or father a child. Talk to your care team if you are concerned about your fertility. What side effects may I notice from receiving this medication? Side effects that you should report to your care team as soon as possible: Allergic reactions--skin rash, itching, hives, swelling of the face, lips, tongue, or throat Blood clot--pain, swelling, or warmth in the leg, shortness of breath, chest pain Dry cough, shortness of breath or trouble breathing Infection--fever, chills, cough, sore throat, wounds that don't heal, pain or trouble when  passing urine, general feeling of discomfort or being unwell Kidney injury--decrease in the amount of urine, swelling of the ankles, hands, or feet Liver injury--right upper belly pain, loss of appetite, nausea, light-colored stool, dark yellow or brown urine, yellowing of the skin or eyes, unusual weakness or fatigue Low red blood cell count--unusual weakness or fatigue, dizziness, headache, trouble breathing Redness, blistering, peeling, or loosening of the skin, including inside the mouth Seizures Unusual bruising or bleeding Side effects that usually do not require medical attention (report to your care team if they continue or are bothersome): Diarrhea Dizziness Hair loss Nausea Pain, redness, or swelling with sores inside the mouth or throat Vomiting This list may not describe all possible side effects. Call your doctor for medical advice about side effects. You may report side effects to FDA at 1-800-FDA-1088. Where should I keep my medication? This medication is given in a hospital or clinic. It will not be stored at home. NOTE: This sheet is a summary. It may not cover all possible information. If you have  questions about this medicine, talk to your doctor, pharmacist, or health care provider.  2024 Elsevier/Gold Standard (2022-12-03 00:00:00)

## 2024-05-16 NOTE — Assessment & Plan Note (Addendum)
 Mild.  Continue monitor Low platelet precautions.

## 2024-05-16 NOTE — Assessment & Plan Note (Addendum)
 Cycle 3 ddMVAC today Follow up with C2 Neutropenic and thrombocytopenic precautions Advised to consume adequate fiber, fluid intake, prevent constipation which he has already been doing so

## 2024-05-17 ENCOUNTER — Inpatient Hospital Stay

## 2024-05-17 VITALS — BP 127/68 | HR 62 | Temp 98.2°F | Resp 20

## 2024-05-17 DIAGNOSIS — C679 Malignant neoplasm of bladder, unspecified: Secondary | ICD-10-CM

## 2024-05-17 DIAGNOSIS — Z5111 Encounter for antineoplastic chemotherapy: Secondary | ICD-10-CM | POA: Diagnosis not present

## 2024-05-17 MED ORDER — MAGNESIUM SULFATE 2 GM/50ML IV SOLN
2.0000 g | Freq: Once | INTRAVENOUS | Status: AC
  Administered 2024-05-17: 2 g via INTRAVENOUS
  Filled 2024-05-17: qty 50

## 2024-05-17 MED ORDER — VINBLASTINE SULFATE CHEMO INJECTION 1 MG/ML
3.0000 mg/m2 | Freq: Once | INTRAVENOUS | Status: AC
  Administered 2024-05-17: 7.5 mg via INTRAVENOUS
  Filled 2024-05-17: qty 7.5

## 2024-05-17 MED ORDER — DEXAMETHASONE SOD PHOSPHATE PF 10 MG/ML IJ SOLN
10.0000 mg | Freq: Once | INTRAMUSCULAR | Status: AC
  Administered 2024-05-17: 10 mg via INTRAVENOUS

## 2024-05-17 MED ORDER — POTASSIUM CHLORIDE IN NACL 20-0.9 MEQ/L-% IV SOLN
Freq: Once | INTRAVENOUS | Status: AC
  Filled 2024-05-17: qty 1000

## 2024-05-17 MED ORDER — SODIUM CHLORIDE 0.9 % IV SOLN
70.0000 mg/m2 | Freq: Once | INTRAVENOUS | Status: AC
  Administered 2024-05-17: 174 mg via INTRAVENOUS
  Filled 2024-05-17: qty 174

## 2024-05-17 MED ORDER — DOXORUBICIN HCL CHEMO IV INJECTION 2 MG/ML
30.0000 mg/m2 | Freq: Once | INTRAVENOUS | Status: AC
  Administered 2024-05-17: 74 mg via INTRAVENOUS
  Filled 2024-05-17: qty 37

## 2024-05-17 MED ORDER — PALONOSETRON HCL INJECTION 0.25 MG/5ML
0.2500 mg | Freq: Once | INTRAVENOUS | Status: AC
  Administered 2024-05-17: 0.25 mg via INTRAVENOUS
  Filled 2024-05-17: qty 5

## 2024-05-17 MED ORDER — SODIUM CHLORIDE 0.9 % IV SOLN: INTRAVENOUS | Status: DC

## 2024-05-17 MED ORDER — SODIUM CHLORIDE 0.9 % IV SOLN
150.0000 mg | Freq: Once | INTRAVENOUS | Status: AC
  Administered 2024-05-17: 150 mg via INTRAVENOUS
  Filled 2024-05-17: qty 150

## 2024-05-17 NOTE — Patient Instructions (Signed)
 [facility] - A DEPT OF [facility]  Discharge Instructions: Thank you for choosing [facility] to provide your oncology and hematology care.   If you have a lab appointment with the Cancer Center, please go directly to the Cancer Center and check in at the registration area.   Wear comfortable clothing and clothing appropriate for easy access to any Portacath or PICC line.   We strive to give you quality time with your provider. You may need to reschedule your appointment if you arrive late (15 or more minutes).  Arriving late affects you and other patients whose appointments are after yours.  Also, if you miss three or more appointments without notifying the office, you may be dismissed from the clinic at the provider's discretion.      For prescription refill requests, have your pharmacy contact our office and allow 72 hours for refills to be completed.    Today you received the following chemotherapy and/or immunotherapy agents: Doxorubicin (Adriamycin), Vinblastine (Velban), Cisplatin (Platinol)      To help prevent nausea and vomiting after your treatment, we encourage you to take your nausea medication as directed.  BELOW ARE SYMPTOMS THAT SHOULD BE REPORTED IMMEDIATELY: *FEVER GREATER THAN 100.4 F (38 C) OR HIGHER *CHILLS OR SWEATING *NAUSEA AND VOMITING THAT IS NOT CONTROLLED WITH YOUR NAUSEA MEDICATION *UNUSUAL SHORTNESS OF BREATH *UNUSUAL BRUISING OR BLEEDING *URINARY PROBLEMS (pain or burning when urinating, or frequent urination) *BOWEL PROBLEMS (unusual diarrhea, constipation, pain near the anus) TENDERNESS IN MOUTH AND THROAT WITH OR WITHOUT PRESENCE OF ULCERS (sore throat, sores in mouth, or a toothache) UNUSUAL RASH, SWELLING OR PAIN  UNUSUAL VAGINAL DISCHARGE OR ITCHING   Items with * indicate a potential emergency and should be followed up as soon as possible or go to the Emergency Department if any problems should  occur.  Please show the CHEMOTHERAPY ALERT CARD or IMMUNOTHERAPY ALERT CARD at check-in to the Emergency Department and triage nurse.  Should you have questions after your visit or need to cancel or reschedule your appointment, please contact [facility]  Dept: [phone]  and follow the prompts.  Office hours are 8:00 a.m. to 4:30 p.m. Monday - Friday. Please note that voicemails left after 4:00 p.m. may not be returned until the following business day.  We are closed weekends and major holidays. You have access to a nurse at all times for urgent questions. Please call the main number to the clinic Dept: [phone] and follow the prompts.   For any non-urgent questions, you may also contact your provider using MyChart. We now offer e-Visits for anyone 54 and older to request care online for non-urgent symptoms. For details visit [email or web address].   Also download the MyChart app! Go to the app store, search MyChart, open the app, select [place], and log in with your MyChart username and password.

## 2024-05-19 ENCOUNTER — Inpatient Hospital Stay

## 2024-05-19 VITALS — BP 132/73 | HR 56 | Temp 98.8°F | Resp 18

## 2024-05-19 DIAGNOSIS — C679 Malignant neoplasm of bladder, unspecified: Secondary | ICD-10-CM

## 2024-05-19 DIAGNOSIS — Z5111 Encounter for antineoplastic chemotherapy: Secondary | ICD-10-CM | POA: Diagnosis not present

## 2024-05-19 MED ORDER — PEGFILGRASTIM-JMDB 6 MG/0.6ML ~~LOC~~ SOSY
6.0000 mg | PREFILLED_SYRINGE | Freq: Once | SUBCUTANEOUS | Status: AC
  Administered 2024-05-19: 6 mg via SUBCUTANEOUS
  Filled 2024-05-19: qty 0.6

## 2024-05-24 NOTE — Progress Notes (Signed)
 History of Present Illness: Peter Oconnor is here today for postoperative check.  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  Gemcitabine  placed postoperatively.  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  He has recovered fairly well from his procedure.  Thus far, he has seen Dr. Tina in [facility] for consultation regarding possible neoadjuvant therapy for for cystectomy.  Also, he had a visit to the [facility] yesterday.  It was recommended that he have neoadjuvant chemo-either MVAC or a [place] protocol.  He has a PET scan scheduled later today.  He is voiding well.  No gross hematuria, no cloudy urine.  He does have urgency and some frequency.  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     Cancer [facility])    Bladder   CHF (congestive heart failure) (HCC)    Patient denies   Chicken pox    Heart murmur August 2025   Test scheduled Nov 2025   Hyperlipidemia with target LDL less than 70 10/19/2015   Hypertension    MI, old    Obesity (BMI 30-39.9) 10/19/2015   OSA (obstructive sleep apnea) 10/19/2015   Pre-diabetes    Sleep apnea     Past Surgical History:  Procedure Laterality Date   BLADDER INSTILLATION N/A 03/07/2024   Procedure: INSTILLATION, BLADDER;  Surgeon: Matilda Senior, MD;  Location: [facility];  Service: Urology;  Laterality: N/A;   CARDIAC CATHETERIZATION  2015   LHC w/ BMS x2 LCx and LAD  done in [place]    COLONOSCOPY     x1   EYE SURGERY Right 2014   Laser repair   done at [facility]   IR IMAGING GUIDED PORT INSERTION  04/04/2024   KNEE  SURGERY Right 2000   torn Meniscus repair   done in [place]    NASAL SINUS SURGERY     ORIF ANKLE FRACTURE Right 09/20/2021   Procedure: OPEN REDUCTION INTERNAL FIXATION (ORIF) ANKLE FRACTURE;  Surgeon: Shari Sieving, MD;  Location: [facility];  Service: Orthopedics;  Laterality: Right;   VASECTOMY  2007    Home Medications:  Allergies as of 05/25/2024       Reactions   Penicillins Itching, Rash        Medication List        Accurate as of May 24, 2024  9:13 PM. If you have any questions, ask your nurse or doctor.          aspirin  EC 81 MG tablet Take 81 mg by mouth daily. Swallow whole.   COLLAGEN PO Take 2 Doses by mouth daily.   dexamethasone  4 MG tablet Commonly known as: DECADRON  Take 2 tablets (8 mg) by mouth daily x 3 days starting the day after cisplatin chemotherapy. Take with food.   lidocaine -prilocaine  cream Commonly known as: EMLA  Apply to affected area once   lisinopril  5 MG tablet Commonly known as: ZESTRIL  Take 1 tablet (5 mg total) by mouth daily.   ondansetron  8 MG tablet Commonly known as: Zofran  Take 1 tablet (8 mg total) by mouth every 8 (eight) hours as needed  for nausea or vomiting. Start on the third day after cisplatin.   oxyCODONE  5 MG immediate release tablet Commonly known as: Roxicodone  Take 1 tablet (5 mg total) by mouth every 4 (four) hours as needed for severe pain (pain score 7-10).   prochlorperazine  10 MG tablet Commonly known as: COMPAZINE  Take 1 tablet (10 mg total) by mouth every 6 (six) hours as needed (Nausea or vomiting).   rosuvastatin  40 MG tablet Commonly known as: CRESTOR  TAKE 1 TABLET BY MOUTH DAILY        Allergies:  Allergies  Allergen Reactions   Penicillins Itching and Rash    Family History  Problem Relation Age of Onset   Hyperlipidemia Mother    Leukemia Father    Arthritis Father    Heart attack Maternal Grandfather    Heart disease Maternal Grandfather    Hyperlipidemia Maternal  Grandfather    Stroke Maternal Grandfather    Hyperlipidemia Maternal Grandmother    Colon cancer Neg Hx    Liver cancer Neg Hx    Stomach cancer Neg Hx    Rectal cancer Neg Hx    Esophageal cancer Neg Hx     Social History:  reports that he quit smoking about 26 years ago. His smoking use included cigarettes and cigars. He has never been exposed to tobacco smoke. He has never used smokeless tobacco. He reports current alcohol use of about 15.0 - 20.0 standard drinks of alcohol per week. He reports that he does not use drugs.  ROS: A complete review of systems was performed.  All systems are negative except for pertinent findings as noted.  Physical Exam:  Vital signs in last 24 hours: There were no vitals taken for this visit. Constitutional:  Alert and oriented, No acute distress Cardiovascular: Regular rate  Respiratory: Normal respiratory effort GI: Abdomen is soft, nontender, nondistended, no abdominal masses. No CVAT.  Genitourinary: Normal male phallus, testes are descended bilaterally and non-tender and without masses, scrotum is normal in appearance without lesions or masses, perineum is normal on inspection. Lymphatic: No lymphadenopathy Neurologic: Grossly intact, no focal deficits Psychiatric: Normal mood and affect  I have reviewed prior pt notes  I have reviewed notes from referring/previous physicians  I have reviewed urinalysis results--no hematuria  I have reviewed pathology results   Impression/Assessment:  Muscle-invasive, high-grade bladder cancer, several different cellular components.  He is undergoing adjuvant ddMVAC by Dr Tina, and has completed 3 cycles so far.  Plan:

## 2024-05-25 ENCOUNTER — Ambulatory Visit: Admitting: Urology

## 2024-05-25 VITALS — BP 131/80 | HR 88 | Ht 71.0 in | Wt 262.0 lb

## 2024-05-25 DIAGNOSIS — C672 Malignant neoplasm of lateral wall of bladder: Secondary | ICD-10-CM | POA: Diagnosis not present

## 2024-05-25 LAB — URINALYSIS, ROUTINE W REFLEX MICROSCOPIC
Bilirubin, UA: NEGATIVE
Glucose, UA: NEGATIVE
Ketones, UA: NEGATIVE
Leukocytes,UA: NEGATIVE
Nitrite, UA: NEGATIVE
Protein,UA: NEGATIVE
RBC, UA: NEGATIVE
Specific Gravity, UA: 1.01 (ref 1.005–1.030)
Urobilinogen, Ur: 0.2 mg/dL (ref 0.2–1.0)
pH, UA: 5.5 (ref 5.0–7.5)

## 2024-05-26 ENCOUNTER — Other Ambulatory Visit: Payer: Self-pay

## 2024-05-27 ENCOUNTER — Other Ambulatory Visit (HOSPITAL_COMMUNITY)

## 2024-05-29 NOTE — Progress Notes (Unsigned)
 [facility] OFFICE PROGRESS NOTE  Patient Care Team: Sebastian Beverley NOVAK, MD as PCP - General (Family Medicine) Shlomo Wilbert SAUNDERS, MD as PCP - Cardiology (Cardiology)  Cordarious is a 63 y.o.male with history of tobacco abuse, HTN, CAD, MI, hyperlipidemia and sleep apnea  [facility].    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  Treatment: 04/18/24 start cycle 1 day 1 ddMVAC   Complete 3 cycles.  Surgery will be on 1/21.    Will proceed with cycle 4. Assessment & Plan Malignant neoplasm of urinary bladder, unspecified site (HCC) Cycle 4 ddMVAC today Follow up  Neutropenic and thrombocytopenic precautions Advised to consume adequate fiber, fluid intake, prevent constipation which he has already been doing so Anemia due to antineoplastic chemotherapy   No orders of the defined types were placed in this encounter.    [name], MD  INTERVAL HISTORY: Patient returns for follow-up. Report of nausea, fatigue. A bit of visual change the last few days. Appetite is following same path as nausea, on day 3 and the weekend. Weight goes down and come back. Good urine flow. No bloody urine. A little neuropathy in the feet not change.  Oncology History  Bladder cancer (HCC)  02/11/2024 Imaging   CT hematuria study 1. Contrast enhancing endoluminal mass of the right aspect of the urinary bladder measuring 5.4 x 2.9 cm with scattered associated calcifications and retraction of the bladder wall well as some adjacent fat stranding. 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  2. No evidence of lymphadenopathy or metastatic disease in the abdomen or pelvis. 3. Punctuate nonobstructive calculus of the midportion of the right kidney. No left-sided calculi, ureteral calculi, or hydronephrosis. 4. Hepatomegaly and hepatic  steatosis. 5. Coronary artery disease.   03/07/2024 Pathology Results   Pathology A. BLADDER TUMOR, TURBT:  Invasive High-grade urothelial carcinoma with giant cells (20%), poorly  differentiated cells (10%) and squamous cell (30%) components.  The carcinoma invades muscularis propria (detrusor muscle)    03/17/2024 Initial Diagnosis   Bladder cancer (HCC)   04/18/2024 -  Chemotherapy   Patient is on Treatment Plan : BLADDER DOSE DENSE MVAC q14d        PHYSICAL EXAMINATION: ECOG PERFORMANCE STATUS: {CHL ONC ECOG ED:[id number]}  Vitals:   05/30/24 1432  BP: 138/75  Pulse: 65  Resp: 18  Temp: 98.3 F (36.8 C)  SpO2: 98%   Filed Weights   05/30/24 1432  Weight: 270 lb (122.5 kg)    GENERAL: alert, no distress and comfortable SKIN: skin color normal and no jaundice or bruising or petechiae on exposed skin EYES: normal, sclera clear OROPHARYNX: no exudate  NECK: No palpable mass LYMPH:  no palpable cervical, axillary lymphadenopathy  LUNGS: clear to auscultation and no wheeze or rales with normal breathing effort HEART: regular rate & rhythm  ABDOMEN: abdomen soft, non-tender and nondistended. Musculoskeletal: no edema NEURO: no focal motor/sensory deficits  Relevant data reviewed during this visit included labs.  New labs ordered.

## 2024-05-29 NOTE — Assessment & Plan Note (Signed)
 Cycle 4 ddMVAC today Follow up  Neutropenic and thrombocytopenic precautions Advised to consume adequate fiber, fluid intake, prevent constipation which he has already been doing so

## 2024-05-30 ENCOUNTER — Inpatient Hospital Stay

## 2024-05-30 ENCOUNTER — Inpatient Hospital Stay (HOSPITAL_BASED_OUTPATIENT_CLINIC_OR_DEPARTMENT_OTHER)

## 2024-05-30 VITALS — BP 138/75 | HR 65 | Temp 98.3°F | Resp 18 | Ht 71.0 in | Wt 270.0 lb

## 2024-05-30 DIAGNOSIS — T451X5A Adverse effect of antineoplastic and immunosuppressive drugs, initial encounter: Secondary | ICD-10-CM

## 2024-05-30 DIAGNOSIS — D6481 Anemia due to antineoplastic chemotherapy: Secondary | ICD-10-CM

## 2024-05-30 DIAGNOSIS — H539 Unspecified visual disturbance: Secondary | ICD-10-CM | POA: Diagnosis not present

## 2024-05-30 DIAGNOSIS — C679 Malignant neoplasm of bladder, unspecified: Secondary | ICD-10-CM

## 2024-05-30 DIAGNOSIS — Z5111 Encounter for antineoplastic chemotherapy: Secondary | ICD-10-CM | POA: Diagnosis not present

## 2024-05-30 LAB — CMP (CANCER CENTER ONLY)
ALT: 11 U/L (ref 0–44)
AST: 12 U/L — ABNORMAL LOW (ref 15–41)
Albumin: 4.1 g/dL (ref 3.5–5.0)
Alkaline Phosphatase: 69 U/L (ref 38–126)
Anion gap: 7 (ref 5–15)
BUN: 13 mg/dL (ref 6–20)
CO2: 27 mmol/L (ref 22–32)
Calcium: 9.6 mg/dL (ref 8.9–10.3)
Chloride: 106 mmol/L (ref 98–111)
Creatinine: 0.82 mg/dL (ref 0.61–1.24)
GFR, Estimated: >60 mL/min (ref >60)
Glucose, Bld: 127 mg/dL — ABNORMAL HIGH (ref 70–99)
Potassium: 3.8 mmol/L (ref 3.5–5.1)
Sodium: 140 mmol/L (ref 135–145)
Total Bilirubin: 0.3 mg/dL (ref 0.0–1.2)
Total Protein: 6.5 g/dL (ref 6.5–8.1)

## 2024-05-30 LAB — CBC WITH DIFFERENTIAL (CANCER CENTER ONLY)
Abs Immature Granulocytes: 2.57 K/uL — ABNORMAL HIGH (ref 0.00–0.07)
Basophils Absolute: 0.1 K/uL (ref 0.0–0.1)
Basophils Relative: 1 %
Eosinophils Absolute: 0 K/uL (ref 0.0–0.5)
Eosinophils Relative: 0 %
HCT: 32.7 % — ABNORMAL LOW (ref 39.0–52.0)
Hemoglobin: 10.9 g/dL — ABNORMAL LOW (ref 13.0–17.0)
Immature Granulocytes: 15 %
Lymphocytes Relative: 14 %
Lymphs Abs: 2.3 K/uL (ref 0.7–4.0)
MCH: 29.5 pg (ref 26.0–34.0)
MCHC: 33.3 g/dL (ref 30.0–36.0)
MCV: 88.6 fL (ref 80.0–100.0)
Monocytes Absolute: 1.2 K/uL — ABNORMAL HIGH (ref 0.1–1.0)
Monocytes Relative: 7 %
Neutro Abs: 10.9 K/uL — ABNORMAL HIGH (ref 1.7–7.7)
Neutrophils Relative %: 63 %
Platelet Count: 163 K/uL (ref 150–400)
RBC: 3.69 MIL/uL — ABNORMAL LOW (ref 4.22–5.81)
RDW: 14.6 % (ref 11.5–15.5)
Smear Review: NORMAL
WBC Count: 17.2 K/uL — ABNORMAL HIGH (ref 4.0–10.5)
nRBC: 0.2 % (ref 0.0–0.2)

## 2024-05-30 LAB — MAGNESIUM: Magnesium: 1.8 mg/dL (ref 1.7–2.4)

## 2024-05-30 MED FILL — Fosaprepitant Dimeglumine For IV Infusion 150 MG (Base Eq): INTRAVENOUS | Qty: 5 | Status: AC

## 2024-05-31 ENCOUNTER — Inpatient Hospital Stay

## 2024-06-02 ENCOUNTER — Inpatient Hospital Stay

## 2024-06-02 DIAGNOSIS — H539 Unspecified visual disturbance: Secondary | ICD-10-CM | POA: Insufficient documentation

## 2024-06-02 NOTE — Assessment & Plan Note (Addendum)
 No clear signs of infection.  A bit of blurry vision. We will hold treatment today. See if any improvement, return in 2 weeks. If no improvement will need to see eye doctor.

## 2024-06-02 NOTE — Assessment & Plan Note (Signed)
 Mild Continue monitor Expected fatigue

## 2024-06-13 ENCOUNTER — Other Ambulatory Visit: Payer: Self-pay | Admitting: *Deleted

## 2024-06-13 DIAGNOSIS — C679 Malignant neoplasm of bladder, unspecified: Secondary | ICD-10-CM

## 2024-06-13 NOTE — Assessment & Plan Note (Signed)
 Cycle 4 ddMVAC today Follow up next month after PET/CT Neutropenic and thrombocytopenic precautions Advised to consume adequate fiber, fluid intake, prevent constipation which he has already been doing so

## 2024-06-13 NOTE — Progress Notes (Unsigned)
 [facility] OFFICE PROGRESS NOTE  Patient Care Team: Sebastian Beverley NOVAK, MD as PCP - General (Family Medicine) Shlomo Wilbert SAUNDERS, MD as PCP - Cardiology (Cardiology)  Peter Oconnor is a 63 y.o.male with history of tobacco abuse, HTN, CAD, MI, hyperlipidemia and sleep apnea  [facility].    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  Treatment: 04/18/24 start cycle 1 day 1 ddMVAC   Complete 3 cycles.   Surgery will be on 1/21.   No additional visual disturbance or changes.  Suspect from anemia.  No concerns from ophthalmology evaluation.  Will continue last cycle of chemotherapy today.  Patient will return after PET scan in January.  Will also check nutritional deficiency as well at the time. Assessment & Plan Malignant neoplasm of urinary bladder, unspecified site (HCC) Cycle 4 ddMVAC today Follow up next month after PET/CT Neutropenic and thrombocytopenic precautions Advised to consume adequate fiber, fluid intake, prevent constipation which he has already been doing so Peripheral polyneuropathy Baseline right lower extremity worse than left.  Unchanged and stable. Chemotherapy-induced nausea Continue compazine  as needed Anemia due to antineoplastic chemotherapy B12, folate, iron panel  Orders Placed This Encounter  Procedures   NM PET Image Restag (PS) Skull Base To Thigh    Standing Status:   Future    Expected Date:   07/12/2024    Expiration Date:   06/14/2025    If indicated for the ordered procedure, I authorize the administration of a radiopharmaceutical per Radiology protocol:   Yes    Preferred imaging location?:   [facility]   CBC with Differential (Cancer Center Only)    Standing Status:   Future    Expiration Date:   06/14/2025   Vitamin B12    Standing Status:   Future    Expiration Date:   06/14/2025    Ferritin    Standing Status:   Future    Expiration Date:   06/14/2025   Folate    Standing Status:   Future    Expiration Date:   06/14/2025   Iron and Iron Binding Capacity ([facility] only)    Standing Status:   Future    Expiration Date:   06/14/2025     [name], MD  INTERVAL HISTORY: Patient returns for follow-up.  Overall feeling well.  No additional visual disturbance or blurry vision.  No new symptoms.  Bowel movement is normal.  No urinary issues.  Oncology History  Bladder cancer (HCC)  02/11/2024 Imaging   CT hematuria study 1. Contrast enhancing endoluminal mass of the right aspect of the urinary bladder measuring 5.4 x 2.9 cm with scattered associated calcifications and retraction of the bladder wall well as some adjacent fat stranding. 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  2. No evidence of lymphadenopathy or metastatic disease in the abdomen or pelvis. 3. Punctuate nonobstructive calculus of the midportion of the right kidney. No left-sided calculi, ureteral calculi, or hydronephrosis. 4. Hepatomegaly and hepatic steatosis. 5. Coronary artery disease.   03/07/2024 Pathology Results   Pathology A. BLADDER TUMOR, TURBT:  Invasive High-grade urothelial carcinoma with giant cells (20%), poorly  differentiated cells (10%) and squamous cell (30%) components.  The carcinoma invades muscularis propria (detrusor muscle)    03/17/2024 Initial Diagnosis   Bladder cancer (HCC)   04/18/2024 -  Chemotherapy   Patient is on Treatment Plan : BLADDER  DOSE DENSE MVAC q14d        PHYSICAL EXAMINATION: ECOG PERFORMANCE STATUS: 0  Vitals:   06/14/24 1059  BP: 115/65  Pulse: 72  Resp: 17  Temp: 98 F (36.7 C)  SpO2: 98%   Filed Weights   06/14/24 1059  Weight: 278 lb 14.4 oz (126.5 kg)   GENERAL: alert, no distress and comfortable SKIN: skin color normal and no jaundice  EYES: sclera clear LUNGS: clear to auscultation and percussion with  normal breathing effort HEART: regular rate & rhythm  ABDOMEN: abdomen soft, non-tender and nondistended. Musculoskeletal: no edema   Relevant data reviewed during this visit included labs.

## 2024-06-14 ENCOUNTER — Inpatient Hospital Stay

## 2024-06-14 VITALS — BP 115/65 | HR 72 | Temp 98.0°F | Resp 17 | Ht 71.0 in | Wt 278.9 lb

## 2024-06-14 VITALS — BP 128/66 | HR 64 | Resp 17

## 2024-06-14 DIAGNOSIS — R110 Nausea: Secondary | ICD-10-CM

## 2024-06-14 DIAGNOSIS — C679 Malignant neoplasm of bladder, unspecified: Secondary | ICD-10-CM

## 2024-06-14 DIAGNOSIS — Z79899 Other long term (current) drug therapy: Secondary | ICD-10-CM | POA: Insufficient documentation

## 2024-06-14 DIAGNOSIS — D6481 Anemia due to antineoplastic chemotherapy: Secondary | ICD-10-CM | POA: Diagnosis not present

## 2024-06-14 DIAGNOSIS — Z5111 Encounter for antineoplastic chemotherapy: Secondary | ICD-10-CM | POA: Diagnosis present

## 2024-06-14 DIAGNOSIS — T451X5A Adverse effect of antineoplastic and immunosuppressive drugs, initial encounter: Secondary | ICD-10-CM | POA: Insufficient documentation

## 2024-06-14 DIAGNOSIS — G629 Polyneuropathy, unspecified: Secondary | ICD-10-CM

## 2024-06-14 LAB — CMP (CANCER CENTER ONLY)
ALT: 17 U/L (ref 0–44)
AST: 20 U/L (ref 15–41)
Albumin: 4.5 g/dL (ref 3.5–5.0)
Alkaline Phosphatase: 45 U/L (ref 38–126)
Anion gap: 10 (ref 5–15)
BUN: 21 mg/dL (ref 8–23)
CO2: 26 mmol/L (ref 22–32)
Calcium: 9.8 mg/dL (ref 8.9–10.3)
Chloride: 105 mmol/L (ref 98–111)
Creatinine: 0.96 mg/dL (ref 0.61–1.24)
GFR, Estimated: >60 mL/min (ref >60)
Glucose, Bld: 120 mg/dL — ABNORMAL HIGH (ref 70–99)
Potassium: 4.3 mmol/L (ref 3.5–5.1)
Sodium: 140 mmol/L (ref 135–145)
Total Bilirubin: 0.5 mg/dL (ref 0.0–1.2)
Total Protein: 7 g/dL (ref 6.5–8.1)

## 2024-06-14 LAB — CBC WITH DIFFERENTIAL (CANCER CENTER ONLY)
Abs Immature Granulocytes: 0.03 K/uL (ref 0.00–0.07)
Basophils Absolute: 0.1 K/uL (ref 0.0–0.1)
Basophils Relative: 1 %
Eosinophils Absolute: 0 K/uL (ref 0.0–0.5)
Eosinophils Relative: 1 %
HCT: 31.4 % — ABNORMAL LOW (ref 39.0–52.0)
Hemoglobin: 10.9 g/dL — ABNORMAL LOW (ref 13.0–17.0)
Immature Granulocytes: 0 %
Lymphocytes Relative: 20 %
Lymphs Abs: 1.4 K/uL (ref 0.7–4.0)
MCH: 30.6 pg (ref 26.0–34.0)
MCHC: 34.7 g/dL (ref 30.0–36.0)
MCV: 88.2 fL (ref 80.0–100.0)
Monocytes Absolute: 0.9 K/uL (ref 0.1–1.0)
Monocytes Relative: 13 %
Neutro Abs: 4.4 K/uL (ref 1.7–7.7)
Neutrophils Relative %: 65 %
Platelet Count: 175 K/uL (ref 150–400)
RBC: 3.56 MIL/uL — ABNORMAL LOW (ref 4.22–5.81)
RDW: 15.8 % — ABNORMAL HIGH (ref 11.5–15.5)
WBC Count: 6.8 K/uL (ref 4.0–10.5)
nRBC: 0 % (ref 0.0–0.2)

## 2024-06-14 LAB — MAGNESIUM: Magnesium: 2.1 mg/dL (ref 1.7–2.4)

## 2024-06-14 MED ORDER — ONDANSETRON HCL 4 MG/2ML IJ SOLN
8.0000 mg | Freq: Once | INTRAMUSCULAR | Status: AC
  Administered 2024-06-14: 8 mg via INTRAVENOUS
  Filled 2024-06-14: qty 4

## 2024-06-14 MED ORDER — SODIUM CHLORIDE 0.9 % IV SOLN: INTRAVENOUS | Status: DC

## 2024-06-14 MED ORDER — METHOTREXATE SODIUM CHEMO INJECTION (PF) 50 MG/2ML
30.1000 mg/m2 | Freq: Once | INTRAMUSCULAR | Status: AC
  Administered 2024-06-14: 75 mg via INTRAVENOUS
  Filled 2024-06-14: qty 3

## 2024-06-14 MED ORDER — DEXAMETHASONE SOD PHOSPHATE PF 10 MG/ML IJ SOLN
10.0000 mg | Freq: Once | INTRAMUSCULAR | Status: AC
  Administered 2024-06-14: 10 mg via INTRAVENOUS

## 2024-06-14 MED FILL — Fosaprepitant Dimeglumine For IV Infusion 150 MG (Base Eq): INTRAVENOUS | Qty: 5 | Status: AC

## 2024-06-14 NOTE — Assessment & Plan Note (Signed)
 B12, folate, iron panel

## 2024-06-14 NOTE — Patient Instructions (Signed)
 [facility] - A DEPT OF [facility]   Discharge Instructions: Thank you for choosing [facility] to provide your oncology and hematology care.   If you have a lab appointment with the Cancer Center, please go directly to the Cancer Center and check in at the registration area.   Wear comfortable clothing and clothing appropriate for easy access to any Portacath or PICC line.   We strive to give you quality time with your provider. You may need to reschedule your appointment if you arrive late (15 or more minutes).  Arriving late affects you and other patients whose appointments are after yours.  Also, if you miss three or more appointments without notifying the office, you may be dismissed from the clinic at the provider's discretion.      For prescription refill requests, have your pharmacy contact our office and allow 72 hours for refills to be completed.    Today you received the following chemotherapy and/or immunotherapy agents: Methotrexate      To help prevent nausea and vomiting after your treatment, we encourage you to take your nausea medication as directed.  BELOW ARE SYMPTOMS THAT SHOULD BE REPORTED IMMEDIATELY: *FEVER GREATER THAN 100.4 F (38 C) OR HIGHER *CHILLS OR SWEATING *NAUSEA AND VOMITING THAT IS NOT CONTROLLED WITH YOUR NAUSEA MEDICATION *UNUSUAL SHORTNESS OF BREATH *UNUSUAL BRUISING OR BLEEDING *URINARY PROBLEMS (pain or burning when urinating, or frequent urination) *BOWEL PROBLEMS (unusual diarrhea, constipation, pain near the anus) TENDERNESS IN MOUTH AND THROAT WITH OR WITHOUT PRESENCE OF ULCERS (sore throat, sores in mouth, or a toothache) UNUSUAL RASH, SWELLING OR PAIN  UNUSUAL VAGINAL DISCHARGE OR ITCHING   Items with * indicate a potential emergency and should be followed up as soon as possible or go to the Emergency Department if any problems should occur.  Please show the CHEMOTHERAPY ALERT CARD or  IMMUNOTHERAPY ALERT CARD at check-in to the Emergency Department and triage nurse.  Should you have questions after your visit or need to cancel or reschedule your appointment, please contact [facility] - A DEPT OF [facility]  Dept: [phone]  and follow the prompts.  Office hours are 8:00 a.m. to 4:30 p.m. Monday - Friday. Please note that voicemails left after 4:00 p.m. may not be returned until the following business day.  We are closed weekends and major holidays. You have access to a nurse at all times for urgent questions. Please call the main number to the clinic Dept: [phone] and follow the prompts.   For any non-urgent questions, you may also contact your provider using MyChart. We now offer e-Visits for anyone 24 and older to request care online for non-urgent symptoms. For details visit [email or web address].   Also download the MyChart app! Go to the app store, search "MyChart", open the app, select [facility], and log in with your MyChart username and password.

## 2024-06-14 NOTE — Assessment & Plan Note (Addendum)
 Baseline right lower extremity worse than left.  Unchanged and stable.

## 2024-06-14 NOTE — Assessment & Plan Note (Addendum)
 Continue compazine  as needed

## 2024-06-15 ENCOUNTER — Other Ambulatory Visit: Payer: Self-pay

## 2024-06-15 ENCOUNTER — Inpatient Hospital Stay

## 2024-06-15 VITALS — BP 126/88 | HR 65 | Temp 98.0°F | Resp 16

## 2024-06-15 DIAGNOSIS — Z5111 Encounter for antineoplastic chemotherapy: Secondary | ICD-10-CM | POA: Diagnosis not present

## 2024-06-15 DIAGNOSIS — C679 Malignant neoplasm of bladder, unspecified: Secondary | ICD-10-CM

## 2024-06-15 MED ORDER — MAGNESIUM SULFATE 2 GM/50ML IV SOLN
2.0000 g | Freq: Once | INTRAVENOUS | Status: AC
  Administered 2024-06-15: 2 g via INTRAVENOUS
  Filled 2024-06-15: qty 50

## 2024-06-15 MED ORDER — DEXAMETHASONE SOD PHOSPHATE PF 10 MG/ML IJ SOLN
10.0000 mg | Freq: Once | INTRAMUSCULAR | Status: AC
  Administered 2024-06-15: 10 mg via INTRAVENOUS

## 2024-06-15 MED ORDER — SODIUM CHLORIDE 0.9 % IV SOLN
70.0000 mg/m2 | Freq: Once | INTRAVENOUS | Status: AC
  Administered 2024-06-15: 174 mg via INTRAVENOUS
  Filled 2024-06-15: qty 174

## 2024-06-15 MED ORDER — PALONOSETRON HCL INJECTION 0.25 MG/5ML
0.2500 mg | Freq: Once | INTRAVENOUS | Status: AC
  Administered 2024-06-15: 0.25 mg via INTRAVENOUS
  Filled 2024-06-15: qty 5

## 2024-06-15 MED ORDER — SODIUM CHLORIDE 0.9 % IV SOLN: INTRAVENOUS | Status: DC

## 2024-06-15 MED ORDER — DOXORUBICIN HCL CHEMO IV INJECTION 2 MG/ML
30.0000 mg/m2 | Freq: Once | INTRAVENOUS | Status: AC
  Administered 2024-06-15: 74 mg via INTRAVENOUS
  Filled 2024-06-15: qty 37

## 2024-06-15 MED ORDER — SODIUM CHLORIDE 0.9 % IV SOLN
150.0000 mg | Freq: Once | INTRAVENOUS | Status: AC
  Administered 2024-06-15: 150 mg via INTRAVENOUS
  Filled 2024-06-15: qty 5
  Filled 2024-06-15: qty 150

## 2024-06-15 MED ORDER — POTASSIUM CHLORIDE IN NACL 20-0.9 MEQ/L-% IV SOLN
Freq: Once | INTRAVENOUS | Status: AC
  Filled 2024-06-15: qty 1000

## 2024-06-15 MED ORDER — VINBLASTINE SULFATE CHEMO INJECTION 1 MG/ML
3.0000 mg/m2 | Freq: Once | INTRAVENOUS | Status: AC
  Administered 2024-06-15: 7.5 mg via INTRAVENOUS
  Filled 2024-06-15: qty 7.5

## 2024-06-15 NOTE — Patient Instructions (Signed)
 [facility] - A DEPT OF [facility]  Discharge Instructions: Thank you for choosing [facility] to provide your oncology and hematology care.   If you have a lab appointment with the Cancer Center, please go directly to the Cancer Center and check in at the registration area.   Wear comfortable clothing and clothing appropriate for easy access to any Portacath or PICC line.   We strive to give you quality time with your provider. You may need to reschedule your appointment if you arrive late (15 or more minutes).  Arriving late affects you and other patients whose appointments are after yours.  Also, if you miss three or more appointments without notifying the office, you may be dismissed from the clinic at the provider's discretion.      For prescription refill requests, have your pharmacy contact our office and allow 72 hours for refills to be completed.    Today you received the following chemotherapy and/or immunotherapy agents: Doxorubicin (Adriamycin), Vinblastine (Velban), Cisplatin (Platinol)      To help prevent nausea and vomiting after your treatment, we encourage you to take your nausea medication as directed.  BELOW ARE SYMPTOMS THAT SHOULD BE REPORTED IMMEDIATELY: *FEVER GREATER THAN 100.4 F (38 C) OR HIGHER *CHILLS OR SWEATING *NAUSEA AND VOMITING THAT IS NOT CONTROLLED WITH YOUR NAUSEA MEDICATION *UNUSUAL SHORTNESS OF BREATH *UNUSUAL BRUISING OR BLEEDING *URINARY PROBLEMS (pain or burning when urinating, or frequent urination) *BOWEL PROBLEMS (unusual diarrhea, constipation, pain near the anus) TENDERNESS IN MOUTH AND THROAT WITH OR WITHOUT PRESENCE OF ULCERS (sore throat, sores in mouth, or a toothache) UNUSUAL RASH, SWELLING OR PAIN  UNUSUAL VAGINAL DISCHARGE OR ITCHING   Items with * indicate a potential emergency and should be followed up as soon as possible or go to the Emergency Department if any problems should  occur.  Please show the CHEMOTHERAPY ALERT CARD or IMMUNOTHERAPY ALERT CARD at check-in to the Emergency Department and triage nurse.  Should you have questions after your visit or need to cancel or reschedule your appointment, please contact [facility]  Dept: [phone]  and follow the prompts.  Office hours are 8:00 a.m. to 4:30 p.m. Monday - Friday. Please note that voicemails left after 4:00 p.m. may not be returned until the following business day.  We are closed weekends and major holidays. You have access to a nurse at all times for urgent questions. Please call the main number to the clinic Dept: [phone] and follow the prompts.   For any non-urgent questions, you may also contact your provider using MyChart. We now offer e-Visits for anyone 54 and older to request care online for non-urgent symptoms. For details visit [email or web address].   Also download the MyChart app! Go to the app store, search MyChart, open the app, select [place], and log in with your MyChart username and password.

## 2024-06-16 ENCOUNTER — Inpatient Hospital Stay

## 2024-06-17 ENCOUNTER — Other Ambulatory Visit: Payer: Self-pay

## 2024-06-17 ENCOUNTER — Inpatient Hospital Stay

## 2024-06-17 VITALS — BP 132/63 | HR 53 | Temp 98.5°F | Resp 16

## 2024-06-17 DIAGNOSIS — C679 Malignant neoplasm of bladder, unspecified: Secondary | ICD-10-CM

## 2024-06-17 DIAGNOSIS — Z5111 Encounter for antineoplastic chemotherapy: Secondary | ICD-10-CM | POA: Diagnosis not present

## 2024-06-17 MED ORDER — PEGFILGRASTIM-JMDB 6 MG/0.6ML ~~LOC~~ SOSY
6.0000 mg | PREFILLED_SYRINGE | Freq: Once | SUBCUTANEOUS | Status: AC
  Administered 2024-06-17: 6 mg via SUBCUTANEOUS
  Filled 2024-06-17: qty 0.6

## 2024-06-20 ENCOUNTER — Inpatient Hospital Stay: Admitting: Licensed Clinical Social Worker

## 2024-06-23 ENCOUNTER — Ambulatory Visit: Admitting: Dermatology

## 2024-06-23 ENCOUNTER — Encounter: Payer: Self-pay | Admitting: Dermatology

## 2024-06-23 VITALS — BP 101/63 | HR 87

## 2024-06-23 DIAGNOSIS — C44311 Basal cell carcinoma of skin of nose: Secondary | ICD-10-CM

## 2024-06-23 DIAGNOSIS — D485 Neoplasm of uncertain behavior of skin: Secondary | ICD-10-CM

## 2024-06-23 DIAGNOSIS — C4491 Basal cell carcinoma of skin, unspecified: Secondary | ICD-10-CM

## 2024-06-23 HISTORY — DX: Basal cell carcinoma of skin, unspecified: C44.91

## 2024-06-23 NOTE — Progress Notes (Signed)
° °  New Patient Visit   Subjective  Peter Oconnor is a 63 y.o. male who presents for the following: growth Pt has a spot on his face he'd like evaluated. It has been present for several months. Pt has no hx of skin cancer.  Patient is currently undergoing bladder cancer treatment with Dr. Tina and has oncologic surgery planned in mid January.   He states that the lesion on his face has been there since earlier this year and has been growing over time. Not previously treated.   The following portions of the chart were reviewed this encounter and updated as appropriate: medications, allergies, medical history  Review of Systems:  No other skin or systemic complaints except as noted in HPI or Assessment and Plan.  Objective  Well appearing patient in no apparent distress; mood and affect are within normal limits.  A focused examination was performed of the following areas: face  Relevant exam findings are noted in the Assessment and Plan. Right Ala 9mm blue pink pearly papule   Assessment & Plan  NEOPLASM OF UNCERTAIN BEHAVIOR OF SKIN Right Ala - Skin / nail biopsy Type of biopsy: tangential   Informed consent: discussed and consent obtained   Timeout: patient name, date of birth, surgical site, and procedure verified   Procedure prep:  Patient was prepped and draped in usual sterile fashion Prep type:  Isopropyl alcohol Anesthesia: the lesion was anesthetized in a standard fashion   Anesthetic:  1% lidocaine  w/ epinephrine  1-100,000 buffered w/ 8.4% NaHCO3 Instrument used: DermaBlade   Hemostasis achieved with: aluminum chloride   Outcome: patient tolerated procedure well   Post-procedure details: sterile dressing applied and wound care instructions given   Dressing type: bandage and pressure dressing    Specimen 1 - Surgical pathology Differential Diagnosis: R/O NMSC  Check Margins: No  Return for right ala tier 3.  I, Darice Smock, CMA, am acting as scribe for RUFUS CHRISTELLA HOLY, MD.   Documentation: I have reviewed the above documentation for accuracy and completeness, and I agree with the above.  RUFUS CHRISTELLA HOLY, MD

## 2024-06-23 NOTE — Patient Instructions (Addendum)

## 2024-06-24 ENCOUNTER — Other Ambulatory Visit: Payer: Self-pay

## 2024-06-27 ENCOUNTER — Ambulatory Visit: Payer: Self-pay | Admitting: Dermatology

## 2024-06-27 LAB — SURGICAL PATHOLOGY

## 2024-06-30 ENCOUNTER — Ambulatory Visit: Admitting: Family Medicine

## 2024-07-01 ENCOUNTER — Inpatient Hospital Stay: Admitting: General Practice

## 2024-07-01 NOTE — Progress Notes (Signed)
 [facility] Advance Directives Spiritual Care  Patient presented to Advance Directives Clinic  to review and complete healthcare advance directives.  Clinical Chaplain met with patient and his wife Peter Oconnor.  The patient designated wife Peter Oconnor of [place] [phone]) as their primary healthcare agent and daughter Peter Oconnor of [place] [phone]) as their secondary agent, with a note to keep son Peter Oconnor [phone]) informed as well.  Patient also completed healthcare living will.    Documents were notarized and copies made for patient/family. Chaplain will send documents to medical records to be scanned into patient's chart. Chaplain encouraged patient/family to contact with any additional questions or concerns.   [address] Peter Oconnor, [place], [facility] Pager [phone] Voicemail [phone]

## 2024-07-05 ENCOUNTER — Ambulatory Visit: Admitting: Family Medicine

## 2024-07-06 ENCOUNTER — Encounter: Payer: Self-pay | Admitting: Dermatology

## 2024-07-06 ENCOUNTER — Ambulatory Visit: Admitting: Dermatology

## 2024-07-06 VITALS — BP 135/61 | HR 83 | Temp 98.4°F

## 2024-07-06 DIAGNOSIS — L578 Other skin changes due to chronic exposure to nonionizing radiation: Secondary | ICD-10-CM

## 2024-07-06 DIAGNOSIS — C44311 Basal cell carcinoma of skin of nose: Secondary | ICD-10-CM | POA: Diagnosis not present

## 2024-07-06 DIAGNOSIS — L814 Other melanin hyperpigmentation: Secondary | ICD-10-CM

## 2024-07-06 DIAGNOSIS — C4491 Basal cell carcinoma of skin, unspecified: Secondary | ICD-10-CM

## 2024-07-06 MED ORDER — MUPIROCIN 2 % EX OINT: 1.0000 | TOPICAL_OINTMENT | Freq: Two times a day (BID) | CUTANEOUS | 1 refills | Status: AC

## 2024-07-06 MED ORDER — OXYCODONE HCL 5 MG PO TABS: 5.0000 mg | ORAL_TABLET | Freq: Four times a day (QID) | ORAL | 0 refills | Status: AC | PRN

## 2024-07-06 NOTE — Progress Notes (Signed)
 "  Follow-Up Visit   Subjective  Peter Oconnor is a 63 y.o. 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 Corey. Patient is accompanied by his wife.  The following portions of the chart were reviewed this encounter and updated as appropriate: medications, allergies, medical history  Review of Systems:  No other skin or systemic complaints except as noted in HPI or Assessment and Plan.  Objective  Well appearing patient in no apparent distress; mood and affect are within normal limits.  A focused examination was performed of the following areas: Right ala Relevant physical exam findings are noted in the Assessment and Plan.   Right Ala Pink nodule with blue grey ovoid nests   Assessment & Plan   BASAL CELL CARCINOMA (BCC), UNSPECIFIED SITE Right Ala - Mohs surgery  Consent obtained: written  Anticoagulation: Is the patient taking prescription anticoagulant and/or aspirin  prescribed/recommended by a physician? Yes   Was the anticoagulation regimen changed prior to Mohs? No    Anesthesia: Anesthesia method: local infiltration Local anesthetic: lidocaine  1% WITH epi  Procedure Details: Timeout: pre-procedure verification complete Procedure Prep: patient was prepped and draped in usual sterile fashion Prep type: chlorhexidine  Biopsy accession number: [id number] Pre-Op diagnosis: basal cell carcinoma BCC subtype: nodular MohsAIQ Surgical site (if tumor spans multiple areas, please select predominant area): nose Surgery side: right Surgical site (from skin exam): Right Ala Pre-operative length (cm): 1.2 Pre-operative width (cm): 0.6 Indications for Mohs surgery: anatomic location where tissue conservation is critical  Micrographic Surgery Details: Post-operative length (cm): 3.2 Post-operative width (cm): 3 Number of Mohs stages: 4 Post surgery depth of defect: skeletal muscle  Stage 1     Tumor features identified on Mohs section: basal carcinoma    Depth of tumor invasion after stage: dermis and subcutaneous fat  Stage 2    Tumor features identified on Mohs section: basal carcinoma    Depth of tumor invasion after stage: dermis and subcutaneous fat  Stage 3    Tumor features identified on Mohs section: basal carcinoma    Depth of tumor invasion after stage: dermis and subcutaneous fat  Stage 4    Tumor features identified on Mohs section: no tumor identified    Depth of tumor invasion after stage: skeletal muscle  Reconstruction: Was the defect reconstructed? Yes   Was reconstruction performed by the same Mohs surgeon? Yes   Setting of reconstruction: outpatient office When was reconstruction performed? same day Type of reconstruction: flap Type of flap: advancement    This Visit - oxyCODONE  (OXY IR/ROXICODONE ) 5 MG immediate release tablet - Take 1 tablet (5 mg total) by mouth every 6 (six) hours as needed for up to 8 doses.   Return in about 4 weeks (around 08/03/2024) for wound check, suture removal wound check Jan 2.  I, Darice Smock, CMA, am acting as scribe for RUFUS CHRISTELLA COREY, MD.    07/09/2024  HISTORY OF PRESENT ILLNESS  Peter Oconnor is seen in consultation at the request of Dr. 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 They note that the area has been present for about 6 months increasing in size with time.  There is no history of previous treatment.  Reports no other new or changing lesions and has no other complaints today.  Medications and allergies: see patient chart.  Review of systems: Reviewed 8 systems and notable for the above skin cancer.  All other systems reviewed are unremarkable/negative, unless noted in the HPI. Past medical history, surgical history, family history, social history were also reviewed and are noted in the chart/questionnaire.    PHYSICAL EXAMINATION  General:  Well-appearing, in no acute distress, alert and oriented x 4. Vitals reviewed in chart (if available).   Skin: Exam reveals a 1.2 x 0.6 cm erythematous papule and biopsy scar on the right nasolabial fold/ala. There are rhytids, telangiectasias, and lentigines, consistent with photodamage.  Biopsy report(s) reviewed, confirming the diagnosis.   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 Due to location, size, histology, or recurrence and the likelihood of subclinical extension as well as the need to conserve normal surrounding tissue, the patient was deemed acceptable for Mohs micrographic surgery (MMS).  The nature and purpose of the procedure, associated benefits and risks including recurrence and scarring, possible complications such as pain, infection, and bleeding, and alternative methods of treatment if appropriate were discussed with the patient during consent. The lesion location was verified by the patient, by reviewing previous notes, pathology reports, and by photographs as well as angulation measurements if available.  Informed consent was reviewed and signed by the patient, and timeout was performed at 8:30 AM. See op note below.  2. For the photodamage and solar lentigines, sun protection discussed/information given on OTC sunscreens, and we recommend continued regular follow-up with primary dermatologist every 6 months or sooner for any growing, bleeding, or changing lesions. 3. Prognosis and future surveillance discussed. 4. Letter with treatment outcome sent to referring provider. 5. Pain acetaminophen /ibuprofen /oxycodone  5 mg   MOHS MICROGRAPHIC SURGERY AND RECONSTRUCTION  Initial size:   1.2 x 0.6 cm Surgical defect/wound size: 3.2 x 3.0 cm Anesthesia:    0.33% lidocaine  with 1:200,000 epinephrine  EBL:    <5 mL Complications:  None Repair type:   Adjacent Tissue Transfer (Advancement  Flap) SQ suture:   5-0 Monocryl, 4-0 vicryl Cutaneous suture:  5-0 Polyprolene Final size of the repair: 8.0 x 4.5 = 36 cm^2  Stages: 4  STAGE I: Anesthesia achieved with 0.5% lidocaine  with 1:200,000 epinephrine . ChloraPrep applied. 1 section(s) excised using Mohs technique (this includes total peripheral and deep tissue margin excision and evaluation with frozen sections, excised and interpreted by the same physician). The tumor was first debulked and then excised with an approx. 2 mm margin.  Hemostasis was achieved with electrocautery as needed.  The specimen was then oriented, subdivided/relaxed, inked, and processed using Mohs technique.    Frozen section analysis revealed a positive margin for islands of cells with peripheral palisading and a haphazard arrangement of the more central cells in the peripheral and deep margin.    STAGE II: An additional 2 mm margin was excised.  Hemostasis was achieved with electrocautery as needed.  The specimen was then oriented, subdivided/relaxed, inked, and processed using Mohs technique.   Frozen section analysis revealed a positive margin for islands of cells with peripheral palisading and a haphazard arrangement of the more central cells in the peripheral margin.  STAGE III: An additional 2 mm margin was excised.  Hemostasis was achieved with electrocautery as needed.  The specimen was then oriented, subdivided/relaxed, inked, and processed using Mohs technique.   Frozen section analysis revealed a positive margin for islands of cells with peripheral palisading and a haphazard arrangement of the more central cells in the peripheral margin.  STAGE IV: An additional 2 mm  margin was excised.  Hemostasis was achieved with electrocautery as needed.  The specimen was then oriented, subdivided/relaxed, inked, and processed using Mohs technique. Evaluation of slides by the Mohs surgeon revealed clear tumor margins.   Reconstruction  PROCEDURE: Advancement  Flap The nature of the procedure was discussed with the patient in detail, including alternatives.  The risks discussed included but not limited to potential for infection, bleeding, scar formation, and damage to underlying structures.  The patient understood the risks and signed the consent form (scanned into chart).  This wound was reconstructed with an advancement flap.  Local anesthesia was achieved with the anesthetic indicated above.  The operative site was prepped with a surgical antiseptic solution, and then draped with sterile towels to insure a sterile field.  The beveled edges of the wound were excised to 90 degrees relative to the surface skin plane.  The wound was undermined in all directions, and meticulous hemostasis was achieved with an electrosurgical device.  Relaxing incisions were made, if necessary, for lateral tension release.  The tissue was advanced and closed centrally, and redundant tissue was trimmed as necessary.  The wound was sutured in a layered fashion to close potential dead space and to precisely and securely approximate the wound edges.  The dimensions of the flap were:  8.0 cm x 4.5 cm for a total flap surface area of 36 centimeters squared (cm2).    The wound was covered with petrolatum and a dressing.  The patient understands the need to return immediately for any signs of infection to include swelling, pain, purulent discharge, localized warmth, or fever.  Contact information was provided to the patient (including after-hours pager numbers)     Documentation: I have reviewed the above documentation for accuracy and completeness, and I agree with the above.  RUFUS CHRISTELLA HOLY, MD  "

## 2024-07-06 NOTE — Patient Instructions (Signed)

## 2024-07-07 ENCOUNTER — Other Ambulatory Visit: Payer: Self-pay

## 2024-07-11 ENCOUNTER — Encounter (HOSPITAL_COMMUNITY): Admission: RE | Admit: 2024-07-11 | Discharge: 2024-07-11 | Disposition: A | Source: Ambulatory Visit

## 2024-07-11 DIAGNOSIS — C679 Malignant neoplasm of bladder, unspecified: Secondary | ICD-10-CM | POA: Diagnosis present

## 2024-07-11 LAB — GLUCOSE, CAPILLARY: Glucose-Capillary: 115 mg/dL — ABNORMAL HIGH (ref 70–99)

## 2024-07-11 MED ORDER — FLUDEOXYGLUCOSE F - 18 (FDG) INJECTION
16.0000 | Freq: Once | INTRAVENOUS | Status: AC
  Administered 2024-07-11: 13.61 via INTRAVENOUS

## 2024-07-15 ENCOUNTER — Encounter: Payer: Self-pay | Admitting: Dermatology

## 2024-07-15 ENCOUNTER — Ambulatory Visit (INDEPENDENT_AMBULATORY_CARE_PROVIDER_SITE_OTHER): Admitting: Dermatology

## 2024-07-15 DIAGNOSIS — C4491 Basal cell carcinoma of skin, unspecified: Secondary | ICD-10-CM

## 2024-07-15 DIAGNOSIS — T1490XD Injury, unspecified, subsequent encounter: Secondary | ICD-10-CM

## 2024-07-15 DIAGNOSIS — L539 Erythematous condition, unspecified: Secondary | ICD-10-CM

## 2024-07-15 DIAGNOSIS — L905 Scar conditions and fibrosis of skin: Secondary | ICD-10-CM

## 2024-07-15 DIAGNOSIS — Z85828 Personal history of other malignant neoplasm of skin: Secondary | ICD-10-CM

## 2024-07-15 MED ORDER — DOXYCYCLINE HYCLATE 100 MG PO CAPS: 100.0000 mg | ORAL_CAPSULE | Freq: Every day | ORAL | 0 refills | Status: AC

## 2024-07-15 NOTE — Progress Notes (Signed)
" ° °  Follow Up Visit   Subjective  Peter Oconnor is a 64 y.o. 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 The patient has been bandaging the wound as directed. The endorse the following concerns: Tender to touch  The following portions of the chart were reviewed this encounter and updated as appropriate: medications, allergies, medical history  Review of Systems:  No other skin or systemic complaints except as noted in HPI or Assessment and Plan.  Objective  Well appearing patient in no apparent distress; mood and affect are within normal limits.  A focal examination was performed including  face and  All findings within normal limits unless otherwise noted below.  Healing wound with mild erythema  Relevant physical exam findings are noted in the Assessment and Plan.       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 , treated on 07/06/24, repaired with a flap - Reassured that wound is healing well - Sutures removed - No evidence of infection - No swelling, induration, purulence, dehiscence, or tenderness out of proportion to the clinical exam, see photo above - Discussed that scars take up to 12 months to mature from the date of surgery - Recommend SPF 30+ to scar daily to prevent purple color from UV exposure during scar maturation process - Discussed that erythema and raised appearance of scar will fade over the next 4-6 months - OK to start scar massage at 4-6 weeks post-op - Can consider silicone based products for scar healing starting at 6 weeks post-op - Ok to continue ointment daily to wound under a bandage for another 7-10 days - Will start doxycycline 100 mg BID x 7 days  HISTORY OF BASAL CELL CARCINOMA OF  THE SKIN - No evidence of recurrence today - Recommend regular full body skin exams - Recommend daily broad spectrum sunscreen SPF 30+ to sun-exposed areas, reapply every 2 hours as needed.  - Call if any new or changing lesions are noted between office visits  Return in about 4 weeks (around 08/12/2024) for mohs follow up.  I, Doyce Pan, CMA, am acting as scribe for Peter CHRISTELLA HOLY, MD.   Documentation: I have reviewed the above documentation for accuracy and completeness, and I agree with the above.  Peter CHRISTELLA HOLY, MD  "

## 2024-07-15 NOTE — Patient Instructions (Signed)

## 2024-07-18 ENCOUNTER — Ambulatory Visit: Admitting: Family Medicine

## 2024-07-18 VITALS — BP 121/71 | HR 82 | Temp 98.4°F | Ht 71.0 in | Wt 280.4 lb

## 2024-07-18 DIAGNOSIS — R7303 Prediabetes: Secondary | ICD-10-CM | POA: Diagnosis not present

## 2024-07-18 DIAGNOSIS — E785 Hyperlipidemia, unspecified: Secondary | ICD-10-CM | POA: Diagnosis not present

## 2024-07-18 DIAGNOSIS — I2510 Atherosclerotic heart disease of native coronary artery without angina pectoris: Secondary | ICD-10-CM | POA: Diagnosis not present

## 2024-07-18 DIAGNOSIS — I10 Essential (primary) hypertension: Secondary | ICD-10-CM | POA: Diagnosis not present

## 2024-07-18 DIAGNOSIS — Z85828 Personal history of other malignant neoplasm of skin: Secondary | ICD-10-CM | POA: Diagnosis not present

## 2024-07-18 DIAGNOSIS — Z01818 Encounter for other preprocedural examination: Secondary | ICD-10-CM

## 2024-07-18 NOTE — Progress Notes (Signed)
 " Assessment & Plan   Assessment/Plan:     Assessment & Plan Preoperative medical evaluation for radical cystectomy and ileal conduit Scheduled for radical cystectomy and ileal conduit in 16 days. Moderate to high complexity procedure. Cardiac clearance obtained. Blood pressure well controlled. No chest pain or shortness of breath. Hemoglobin stable at 10.9, above transfusion threshold. Discussed potential risks of surgery including bleeding, myocardial infarction, and blood pressure complications. Emphasized importance of preoperative optimization to minimize risks. - Ordered hemoglobin A1c to assess for diabetes - Ordered lipid panel to assess cholesterol levels - Ordered apolipoprotein A and B to evaluate cardiovascular risk - Ordered urinalysis with reflex culture due to bladder cancer - Ordered microalbumin creatinine ratio due to hypertension - Follow up on CMP to check LFTs to screen for fatty liver disease  Bladder cancer Undergoing treatment with chemotherapy. Scheduled for radical cystectomy and ileal conduit. Recent chemotherapy included doxorubicin , which is cardiotoxic. No new symptoms reported. - Proceed with scheduled radical cystectomy and ileal conduit  Anemia due to antineoplastic chemotherapy Anemia secondary to chemotherapy, hemoglobin at 10.9. No immediate need for transfusion as hemoglobin is above threshold. Discussed potential causes of anemia including chemotherapy. - Follow up on CBC with differential/plt - Follow up on anemia work up including B12, folate, iron studies including ferritin and iron binding capacity  Coronary artery disease Managed with aspirin  and rosuvastatin . Recent lipid panel showed LDL at 96. Discussed importance of maintaining cholesterol levels to reduce cardiovascular risk. Consideration of apolipoprotein A and B for further cardiovascular risk assessment. - Continue aspirin  81 mg daily - Continue rosuvastatin  40 mg daily - Ordered lipid  panel - Ordered apolipoprotein A and B  Hypertension Well controlled with lisinopril . Blood pressure readings stable between 110-130/70. Discussed importance of maintaining blood pressure control to reduce surgical risks. - Continue lisinopril  5 mg daily - Follow up on CMP to check EGFR, electrolytes, and magnesium   Hyperlipidemia Managed with rosuvastatin . Recent lipid panel showed LDL at 96. Discussed lifestyle modifications including diet and exercise to improve lipid profile. - Continue rosuvastatin  40 mg daily - Ordered lipid panel  Prediabetes Recent A1c of 6.1. Discussed importance of monitoring blood glucose levels to prevent progression to diabetes. Consideration of hemoglobin A1c to assess current status. - Ordered hemoglobin A1c  Morbid obesity BMI of 39. Discussed potential benefits of weight loss on overall health, including prediabetes and sleep apnea. Consideration of future use of GLP1 agonists for weight management and cardiovascular risk reduction. - Encouraged lifestyle modifications including diet and exercise  Obstructive sleep apnea Managed with CPAP. Discussed potential benefits of weight loss on sleep apnea management. - Continue CPAP therapy  History of basal cell carcinoma, status post Mohs surgery Status post Mohs surgery for basal cell carcinoma on the right check and nose. Healing well with no complications reported. - Continue post-operative care as advised by dermatologist        There are no discontinued medications.  Follow up as needed        Subjective:   Encounter date: 07/18/2024  Marquelle Musgrave is a 64 y.o. male who has CAD (coronary artery disease), native coronary artery; Hyperlipidemia with target LDL less than 70; Benign essential HTN; OSA (obstructive sleep apnea); Obesity (BMI 30-39.9); Left knee pain; Effusion of knee joint, left; Primary osteoarthritis of both knees; Encounter for removal of sutures; Light headed; Dislocation  of right shoulder joint; History of ASCVD; Preop general physical exam; Bladder cancer (HCC); Prediabetes; Peripheral polyneuropathy; Skin lesion of face; Thrombocytopenia; Anemia  due to antineoplastic chemotherapy; Vision disturbance; Chemotherapy-induced nausea; and History of basal cell carcinoma, right cheek and nose on their problem list..   He  has a past medical history of Allergy (1983), Arthritis, Basal cell carcinoma of skin (06/23/2024), Bladder cancer [facility]) (July 2025), CAD (coronary artery disease), native coronary artery (01/25/2014), Cancer [facility]), CHF (congestive heart failure) (HCC), Chicken pox, Heart murmur (August 2025), Hyperlipidemia with target LDL less than 70 (10/19/2015), Hypertension, MI, old, Obesity (BMI 30-39.9) (10/19/2015), OSA (obstructive sleep apnea) (10/19/2015), Pre-diabetes, and Sleep apnea.SABRA   He presents with chief complaint of Medical Management of Chronic Issues (Pt presents today for a Medical Clearance for [facility],) .   Discussed the use of AI scribe software for clinical note transcription with the patient, who gave verbal consent to proceed.  History of Present Illness Thaison Kolodziejski is a 64 year old male who presents for surgical clearance for an upcoming cystectomy and ileoconduit.  Preoperative evaluation for bladder cancer surgery - Scheduled for radical cystectomy and ileoconduit in sixteen days for bladder cancer - Preoperative visit in [place] scheduled in eight days for additional laboratory testing  Hypertension - Well-controlled with lisinopril  5 mg daily - No chest pain or shortness of breath - Cardiac clearance obtained from cardiologist in August prior to previous procedure  Prediabetes - Previous hemoglobin A1c of 6.1% in April - Glucose levels during chemotherapy ranged from 102 to 105 mg/dL (not fasting) - Currently following a clean diet, avoiding packaged foods - Plans to resume regular  exercise after surgery  Hyperlipidemia - On rosuvastatin  40 mg daily - Last cholesterol panel in April showed LDL of 96 mg/dL - Significant improvement in LDL attributed to diet and exercise during visit to [place]  Coronary artery disease - On aspirin  therapy - EKG on March 04, 2024, showed normal sinus rhythm with nonspecific T wave abnormalities compared to prior EKG  Anemia secondary to chemotherapy - Stable hemoglobin of 10.9 g/dL as of December - Hemoglobin level considered acceptable for surgery as long as above 8 g/dL  Chemotherapy-induced peripheral neuropathy - Experiencing peripheral neuropathy associated with chemotherapy  Obstructive sleep apnea - Uses CPAP therapy  Postoperative care for basal cell carcinoma - Recently underwent Mohs surgery for basal cell carcinoma on nose - Stitches removed last Friday - Surgical site healing well with continued application of ointment as directed     ROS  Past Surgical History:  Procedure Laterality Date   BLADDER INSTILLATION N/A 03/07/2024   Procedure: INSTILLATION, BLADDER;  Surgeon: Matilda Senior, MD;  Location: [facility];  Service: Urology;  Laterality: N/A;   CARDIAC CATHETERIZATION  2015   LHC w/ BMS x2 LCx and LAD  done in [place]    COLONOSCOPY     x1   EYE SURGERY Right 2014   Laser repair   done at [facility]   IR IMAGING GUIDED PORT INSERTION  04/04/2024   KNEE SURGERY Right 2000   torn Meniscus repair   done in [place]    NASAL SINUS SURGERY     ORIF ANKLE FRACTURE Right 09/20/2021   Procedure: OPEN REDUCTION INTERNAL FIXATION (ORIF) ANKLE FRACTURE;  Surgeon: Shari Sieving, MD;  Location: [facility];  Service: Orthopedics;  Laterality: Right;   VASECTOMY  2007    Outpatient Medications Prior to Visit  Medication Sig Dispense Refill   aspirin  EC 81 MG tablet Take 81 mg by mouth daily. Swallow whole.     lisinopril  (ZESTRIL ) 5 MG tablet Take 1 tablet (  5 mg total) by mouth daily. 90 tablet 3   mupirocin   ointment (BACTROBAN ) 2 % Apply 1 Application topically 2 (two) times daily. 30 g 1   rosuvastatin  (CRESTOR ) 40 MG tablet TAKE 1 TABLET BY MOUTH DAILY 90 tablet 3   Collagen-Vitamin C-Biotin (COLLAGEN PO) Take 2 Doses by mouth daily. (Patient not taking: Reported on 07/18/2024)     dexamethasone  (DECADRON ) 4 MG tablet Take 2 tablets (8 mg) by mouth daily x 3 days starting the day after cisplatin  chemotherapy. Take with food. (Patient not taking: Reported on 07/18/2024) 30 tablet 1   doxycycline  (VIBRAMYCIN ) 100 MG capsule Take 1 capsule (100 mg total) by mouth daily for 7 days. Take twice a day for 7 days with a HEAVY MEAL (Patient not taking: Reported on 07/18/2024) 14 capsule 0   lidocaine -prilocaine  (EMLA ) cream Apply to affected area once (Patient not taking: Reported on 07/18/2024) 30 g 3   ondansetron  (ZOFRAN ) 8 MG tablet Take 1 tablet (8 mg total) by mouth every 8 (eight) hours as needed for nausea or vomiting. Start on the third day after cisplatin . (Patient not taking: Reported on 07/18/2024) 30 tablet 1   oxyCODONE  (OXY IR/ROXICODONE ) 5 MG immediate release tablet Take 1 tablet (5 mg total) by mouth every 6 (six) hours as needed for up to 8 doses. (Patient not taking: Reported on 07/18/2024) 8 tablet 0   oxyCODONE  (ROXICODONE ) 5 MG immediate release tablet Take 1 tablet (5 mg total) by mouth every 4 (four) hours as needed for severe pain (pain score 7-10). (Patient not taking: Reported on 07/18/2024) 10 tablet 0   prochlorperazine  (COMPAZINE ) 10 MG tablet Take 1 tablet (10 mg total) by mouth every 6 (six) hours as needed (Nausea or vomiting). (Patient not taking: Reported on 07/18/2024) 60 tablet 0   Facility-Administered Medications Prior to Visit  Medication Dose Route Frequency Provider Last Rate Last Admin   0.9 %  sodium chloride  infusion  500 mL Intravenous Once Eda Iha, MD        Family History  Problem Relation Age of Onset   Hyperlipidemia Mother    Leukemia Father    Arthritis Father     Heart attack Maternal Grandfather    Heart disease Maternal Grandfather    Hyperlipidemia Maternal Grandfather    Stroke Maternal Grandfather    Hyperlipidemia Maternal Grandmother    Colon cancer Neg Hx    Liver cancer Neg Hx    Stomach cancer Neg Hx    Rectal cancer Neg Hx    Esophageal cancer Neg Hx     Social History   Socioeconomic History   Marital status: Married    Spouse name: Not on file   Number of children: 3   Years of education: Not on file   Highest education level: Bachelor's degree (e.g., BA, AB, BS)  Occupational History   Occupation: self employed  Tobacco Use   Smoking status: Former    Current packs/day: 0.00    Types: Cigarettes, Cigars    Quit date: 03/14/1998    Years since quitting: 26.3    Passive exposure: Never   Smokeless tobacco: Never   Tobacco comments:    Stopped smoking cigars 2018  Vaping Use   Vaping status: Never Used  Substance and Sexual Activity   Alcohol use: Yes    Alcohol/week: 15.0 - 20.0 standard drinks of alcohol    Types: 15 - 20 Glasses of wine per week    Comment: Not drinking wine since discovered  mass in bladder   Drug use: No   Sexual activity: Not Currently  Other Topics Concern   Not on file  Social History Narrative   Not on file   Social Drivers of Health   Tobacco Use: Medium Risk (07/15/2024)   Patient History    Smoking Tobacco Use: Former    Smokeless Tobacco Use: Never    Passive Exposure: Never  Physicist, Medical Strain: Low Risk (07/14/2024)   Overall Financial Resource Strain (CARDIA)    Difficulty of Paying Living Expenses: Not hard at all  Food Insecurity: No Food Insecurity (07/14/2024)   Epic    Worried About Programme Researcher, Broadcasting/film/video in the Last Year: Never true    Ran Out of Food in the Last Year: Never true  Transportation Needs: No Transportation Needs (07/14/2024)   Epic    Lack of Transportation (Medical): No    Lack of Transportation (Non-Medical): No  Physical Activity: Sufficiently Active  (07/14/2024)   Exercise Vital Sign    Days of Exercise per Week: 4 days    Minutes of Exercise per Session: 60 min  Stress: No Stress Concern Present (07/14/2024)   [employer] of Occupational Health - Occupational Stress Questionnaire    Feeling of Stress: Not at all  Social Connections: Socially Integrated (07/14/2024)   Social Connection and Isolation Panel    Frequency of Communication with Friends and Family: Twice a week    Frequency of Social Gatherings with Friends and Family: Once a week    Attends Religious Services: More than 4 times per year    Active Member of [employer] or Organizations: Yes    Attends Banker Meetings: More than 4 times per year    Marital Status: Married  Catering Manager Violence: Not At Risk (05/02/2024)   Epic    Fear of Current or Ex-Partner: No    Emotionally Abused: No    Physically Abused: No    Sexually Abused: No  Depression (PHQ2-9): Low Risk (07/18/2024)   Depression (PHQ2-9)    PHQ-2 Score: 0  Alcohol Screen: Low Risk (07/14/2024)   Alcohol Screen    Last Alcohol Screening Score (AUDIT): 3  Housing: Low Risk (07/14/2024)   Epic    Unable to Pay for Housing in the Last Year: No    Number of Times Moved in the Last Year: 0    Homeless in the Last Year: No  Utilities: Not At Risk (03/18/2024)   Epic    Threatened with loss of utilities: No  Health Literacy: Not on file                                                                                                  Objective:  Physical Exam: BP 121/71   Pulse 82   Temp 98.4 F (36.9 C)   Ht 5' 11 (1.803 m)   Wt 280 lb 6.4 oz (127.2 kg)   SpO2 96%   BMI 39.11 kg/m    Physical Exam VITALS: BP- 121/71 MEASUREMENTS: BMI- 39.0. GENERAL: Alert, cooperative, well developed, no acute distress HEENT: Normocephalic, normal  oropharynx, moist mucous membranes, well healing surgical wound on right side of nose and right cheek CHEST: Clear to auscultation bilaterally, no wheezes,  rhonchi, or crackles CARDIOVASCULAR: Normal heart rate and rhythm, S1 and S2 normal without murmurs ABDOMEN: Soft, non-tender, non-distended, without organomegaly, normal bowel sounds EXTREMITIES: No cyanosis, edema, or leg swelling NEUROLOGICAL: Cranial nerves grossly intact, moves all extremities without gross motor or sensory deficit   Physical Exam  No results found.  Recent Results (from the past 2160 hours)  Magnesium      Status: None   Collection Time: 05/02/24 11:48 AM  Result Value Ref Range   Magnesium  1.9 1.7 - 2.4 mg/dL    Comment: Performed at [facility], [address], [place], [place] [address]  CMP (Cancer Center only)     Status: Abnormal   Collection Time: 05/02/24 11:48 AM  Result Value Ref Range   Sodium 138 135 - 145 mmol/L   Potassium 4.0 3.5 - 5.1 mmol/L   Chloride 104 98 - 111 mmol/L   CO2 27 22 - 32 mmol/L   Glucose, Bld 119 (H) 70 - 99 mg/dL    Comment: Glucose reference range applies only to samples taken after fasting for at least 8 hours.   BUN 19 6 - 20 mg/dL   Creatinine 9.19 9.38 - 1.24 mg/dL   Calcium  10.4 (H) 8.9 - 10.3 mg/dL   Total Protein 7.1 6.5 - 8.1 g/dL   Albumin 4.3 3.5 - 5.0 g/dL   AST 16 15 - 41 U/L   ALT 15 0 - 44 U/L   Alkaline Phosphatase 59 38 - 126 U/L   Total Bilirubin 0.3 0.0 - 1.2 mg/dL   GFR, Estimated >39 >39 mL/min    Comment: (NOTE) Calculated using the CKD-EPI Creatinine Equation (2021)    Anion gap 7 5 - 15    Comment: Performed at [facility], [address] [place], [place] [address]  CBC with Differential (Cancer Center Only)     Status: Abnormal   Collection Time: 05/02/24 11:48 AM  Result Value Ref Range   WBC Count 12.6 (H) 4.0 - 10.5 K/uL   RBC 4.43 4.22 - 5.81 MIL/uL   Hemoglobin 13.4 13.0 - 17.0 g/dL   HCT 60.3 60.9 - 47.9 %   MCV 89.4 80.0 - 100.0 fL   MCH 30.2 26.0 - 34.0 pg   MCHC 33.8 30.0 - 36.0 g/dL   RDW 86.7 88.4 - 84.4 %   Platelet  Count 183 150 - 400 K/uL   nRBC 0.2 0.0 - 0.2 %   Neutrophils Relative % 66 %   Neutro Abs 8.7 (H) 1.7 - 7.7 K/uL   Band Neutrophils 3 %   Lymphocytes Relative 20 %   Lymphs Abs 2.5 0.7 - 4.0 K/uL   Monocytes Relative 6 %   Monocytes Absolute 0.8 0.1 - 1.0 K/uL   Eosinophils Relative 0 %   Eosinophils Absolute 0.0 0.0 - 0.5 K/uL   Basophils Relative 1 %   Basophils Absolute 0.1 0.0 - 0.1 K/uL   WBC Morphology See Note     Comment: Mild Left Shift (1-5% metas, occ myelo)   RBC Morphology See Note     Comment: 1% nRBC   Smear Review Normal platelet morphology    Metamyelocytes Relative 4 %   Abs Immature Granulocytes 0.50 (H) 0.00 - 0.07 K/uL   Polychromasia PRESENT    Ovalocytes PRESENT     Comment: Performed  at [facility], [address] [place], [place] [address]  Magnesium      Status: None   Collection Time: 05/16/24 10:25 AM  Result Value Ref Range   Magnesium  1.9 1.7 - 2.4 mg/dL    Comment: Performed at [facility], [address] [place], [place] [address]  CMP (Cancer Center only)     Status: Abnormal   Collection Time: 05/16/24 10:25 AM  Result Value Ref Range   Sodium 140 135 - 145 mmol/L   Potassium 4.1 3.5 - 5.1 mmol/L   Chloride 106 98 - 111 mmol/L   CO2 28 22 - 32 mmol/L    Comment: (NOTE) Elevated LDH levels may cause falsely increased CO2 results. If LDH is >2000 U/L, a positive bias of 12% is possible.     Glucose, Bld 104 (H) 70 - 99 mg/dL    Comment: Glucose reference range applies only to samples taken after fasting for at least 8 hours.   BUN 15 6 - 20 mg/dL   Creatinine 9.17 9.38 - 1.24 mg/dL   Calcium  9.8 8.9 - 10.3 mg/dL   Total Protein 6.9 6.5 - 8.1 g/dL   Albumin 4.2 3.5 - 5.0 g/dL   AST 14 (L) 15 - 41 U/L   ALT 14 0 - 44 U/L   Alkaline Phosphatase 71 38 - 126 U/L   Total Bilirubin 0.3 0.0 - 1.2 mg/dL   GFR, Estimated >39 >39 mL/min    Comment: (NOTE) Calculated using the CKD-EPI  Creatinine Equation (2021)    Anion gap 6 5 - 15    Comment: Performed at [facility], [address], [place], [place] [address]  CBC with Differential (Cancer Center Only)     Status: Abnormal   Collection Time: 05/16/24 10:25 AM  Result Value Ref Range   WBC Count 18.6 (H) 4.0 - 10.5 K/uL   RBC 4.01 (L) 4.22 - 5.81 MIL/uL   Hemoglobin 12.2 (L) 13.0 - 17.0 g/dL   HCT 64.4 (L) 60.9 - 47.9 %   MCV 88.5 80.0 - 100.0 fL   MCH 30.4 26.0 - 34.0 pg   MCHC 34.4 30.0 - 36.0 g/dL   RDW 85.9 88.4 - 84.4 %   Platelet Count 133 (L) 150 - 400 K/uL   nRBC 0.2 0.0 - 0.2 %   Neutrophils Relative % 66 %   Neutro Abs 12.3 (H) 1.7 - 7.7 K/uL   Lymphocytes Relative 13 %   Lymphs Abs 2.3 0.7 - 4.0 K/uL   Monocytes Relative 6 %   Monocytes Absolute 1.1 (H) 0.1 - 1.0 K/uL   Eosinophils Relative 0 %   Eosinophils Absolute 0.0 0.0 - 0.5 K/uL   Basophils Relative 1 %   Basophils Absolute 0.2 (H) 0.0 - 0.1 K/uL   WBC Morphology See Note     Comment: Moderate Left Shift (>5% metas and myelos) TOXIC GRANULATION    Smear Review Normal platelet morphology    Immature Granulocytes 14 %    Comment: Increased IG's, likely caused by Bone Marrow Colony Stimulating Factor received within 30 days.   Abs Immature Granulocytes 2.68 (H) 0.00 - 0.07 K/uL   Polychromasia PRESENT     Comment: Performed at [facility], [address] [place], [place] [address]  Urinalysis, Routine w reflex microscopic     Status: None   Collection Time: 05/25/24 12:00 AM  Result Value Ref Range   Specific Gravity,  UA 1.010 1.005 - 1.030   pH, UA 5.5 5.0 - 7.5   Color, UA Yellow Yellow   Appearance Ur Clear Clear   Leukocytes,UA Negative Negative   Protein,UA Negative Negative/Trace   Glucose, UA Negative Negative   Ketones, UA Negative Negative   RBC, UA Negative Negative   Bilirubin, UA Negative Negative   Urobilinogen, Ur 0.2 0.2 - 1.0 mg/dL   Nitrite, UA Negative  Negative   Microscopic Examination Comment     Comment: Microscopic not indicated and not performed.  Magnesium      Status: None   Collection Time: 05/30/24  2:07 PM  Result Value Ref Range   Magnesium  1.8 1.7 - 2.4 mg/dL    Comment: Performed at [facility], [address], [place], [place] [address]  CMP (Cancer Center only)     Status: Abnormal   Collection Time: 05/30/24  2:07 PM  Result Value Ref Range   Sodium 140 135 - 145 mmol/L   Potassium 3.8 3.5 - 5.1 mmol/L   Chloride 106 98 - 111 mmol/L   CO2 27 22 - 32 mmol/L    Comment: (NOTE) Elevated LDH levels may cause falsely increased CO2 results. If LDH is >2000 U/L, a positive bias of 12% is possible.     Glucose, Bld 127 (H) 70 - 99 mg/dL    Comment: Glucose reference range applies only to samples taken after fasting for at least 8 hours.   BUN 13 6 - 20 mg/dL   Creatinine 9.17 9.38 - 1.24 mg/dL   Calcium  9.6 8.9 - 10.3 mg/dL   Total Protein 6.5 6.5 - 8.1 g/dL   Albumin 4.1 3.5 - 5.0 g/dL   AST 12 (L) 15 - 41 U/L   ALT 11 0 - 44 U/L   Alkaline Phosphatase 69 38 - 126 U/L   Total Bilirubin 0.3 0.0 - 1.2 mg/dL   GFR, Estimated >39 >39 mL/min    Comment: (NOTE) Calculated using the CKD-EPI Creatinine Equation (2021)    Anion gap 7 5 - 15    Comment: Performed at [facility], [address] [place], [place] [address]  CBC with Differential (Cancer Center Only)     Status: Abnormal   Collection Time: 05/30/24  2:07 PM  Result Value Ref Range   WBC Count 17.2 (H) 4.0 - 10.5 K/uL   RBC 3.69 (L) 4.22 - 5.81 MIL/uL   Hemoglobin 10.9 (L) 13.0 - 17.0 g/dL   HCT 67.2 (L) 60.9 - 47.9 %   MCV 88.6 80.0 - 100.0 fL   MCH 29.5 26.0 - 34.0 pg   MCHC 33.3 30.0 - 36.0 g/dL   RDW 85.3 88.4 - 84.4 %   Platelet Count 163 150 - 400 K/uL   nRBC 0.2 0.0 - 0.2 %   Neutrophils Relative % 63 %   Neutro Abs 10.9 (H) 1.7 - 7.7 K/uL   Lymphocytes Relative 14 %   Lymphs Abs 2.3 0.7 - 4.0  K/uL   Monocytes Relative 7 %   Monocytes Absolute 1.2 (H) 0.1 - 1.0 K/uL   Eosinophils Relative 0 %   Eosinophils Absolute 0.0 0.0 - 0.5 K/uL   Basophils Relative 1 %   Basophils Absolute 0.1 0.0 - 0.1 K/uL   WBC Morphology See Note     Comment: Moderate Left Shift (>5% metas and myelos)   Smear Review Normal platelet morphology    Immature Granulocytes 15 %   Abs Immature  Granulocytes 2.57 (H) 0.00 - 0.07 K/uL   Polychromasia PRESENT    Ovalocytes PRESENT     Comment: Performed at [facility], [address], [place], [place] [address]  CMP (Cancer Center only)     Status: Abnormal   Collection Time: 06/14/24 10:28 AM  Result Value Ref Range   Sodium 140 135 - 145 mmol/L   Potassium 4.3 3.5 - 5.1 mmol/L   Chloride 105 98 - 111 mmol/L   CO2 26 22 - 32 mmol/L   Glucose, Bld 120 (H) 70 - 99 mg/dL    Comment: Glucose reference range applies only to samples taken after fasting for at least 8 hours.   BUN 21 8 - 23 mg/dL   Creatinine 9.03 9.38 - 1.24 mg/dL   Calcium  9.8 8.9 - 10.3 mg/dL   Total Protein 7.0 6.5 - 8.1 g/dL   Albumin 4.5 3.5 - 5.0 g/dL   AST 20 15 - 41 U/L   ALT 17 0 - 44 U/L   Alkaline Phosphatase 45 38 - 126 U/L   Total Bilirubin 0.5 0.0 - 1.2 mg/dL   GFR, Estimated >39 >39 mL/min    Comment: (NOTE) Calculated using the CKD-EPI Creatinine Equation (2021)    Anion gap 10 5 - 15    Comment: Performed at [facility], [address], [place], [place] [address]  Magnesium      Status: None   Collection Time: 06/14/24 10:28 AM  Result Value Ref Range   Magnesium  2.1 1.7 - 2.4 mg/dL    Comment: Performed at [facility], [address] [place], [place] [address]  CBC with Differential (Cancer Center Only)     Status: Abnormal   Collection Time: 06/14/24 10:28 AM  Result Value Ref Range   WBC Count 6.8 4.0 - 10.5 K/uL   RBC 3.56 (L) 4.22 - 5.81 MIL/uL   Hemoglobin 10.9 (L) 13.0 - 17.0  g/dL   HCT 68.5 (L) 60.9 - 47.9 %   MCV 88.2 80.0 - 100.0 fL   MCH 30.6 26.0 - 34.0 pg   MCHC 34.7 30.0 - 36.0 g/dL   RDW 84.1 (H) 88.4 - 84.4 %   Platelet Count 175 150 - 400 K/uL   nRBC 0.0 0.0 - 0.2 %   Neutrophils Relative % 65 %   Neutro Abs 4.4 1.7 - 7.7 K/uL   Lymphocytes Relative 20 %   Lymphs Abs 1.4 0.7 - 4.0 K/uL   Monocytes Relative 13 %   Monocytes Absolute 0.9 0.1 - 1.0 K/uL   Eosinophils Relative 1 %   Eosinophils Absolute 0.0 0.0 - 0.5 K/uL   Basophils Relative 1 %   Basophils Absolute 0.1 0.0 - 0.1 K/uL   Immature Granulocytes 0 %   Abs Immature Granulocytes 0.03 0.00 - 0.07 K/uL    Comment: Performed at [facility], [address] [place], [place] [address]  Surgical pathology     Status: None   Collection Time: 06/23/24 12:00 AM  Result Value Ref Range   SURGICAL PATHOLOGY      SURGICAL PATHOLOGY [facility] [address] [place], [place] [address] Telephone [phone] or [phone] Fax [phone]  REPORT OF DERMATOPATHOLOGY   Accession #: [id number] Patient Name: JASHAD, DEPAULA Visit # : [id number]  MRN: [id number] Cytotechnologist: Munoz-Bishop Md, Eleanor, Dermatopathologist, Electronic Signature DOB/Age 29-Aug-1960 (Age: 62) Gender: M Collected Date: 06/23/2024 Received Date: 06/23/2024  FINAL DIAGNOSIS       1. Skin, right ala :       BASAL CELL CARCINOMA, NODULAR PATTERN, BASE INVOLVED       DATE SIGNED OUT: 06/27/2024 ELECTRONIC SIGNATURE : Munoz-Bishop Md, Melissa, Dermatopathologist, Electronic Signature  MICROSCOPIC DESCRIPTION 1. There are dermal nodular aggregates of atypical basaloid cells with peripheral palisading. This is a nodular basal cell carcinoma.  These changes extend to the base of the biopsy.  CASE COMMENTS STAINS USED IN DIAGNOSIS: H&E    CLINICAL HISTO RY  SPECIMEN(S) OBTAINED 1. Skin, Right Ala  SPECIMEN COMMENTS: SPECIMEN CLINICAL  INFORMATION: 1. Neoplasm of uncertain behavior of skin, R/O NMSC    Gross Description 1. Formalin fixed specimen received:  9 X 7 X 2 MM, TOTO (3 P) (1 B) ( mm )        Report signed out from the following location(s) [place] [address] [place], [place] [address] CLIA #: [id number]  [facility] [address] [place], [place] [address] CLIA #: [id number]   Glucose, capillary     Status: Abnormal   Collection Time: 07/11/24  7:59 AM  Result Value Ref Range   Glucose-Capillary 115 (H) 70 - 99 mg/dL    Comment: Glucose reference range applies only to samples taken after fasting for at least 8 hours.        Beverley Adine Hummer, MD, MS "

## 2024-07-19 ENCOUNTER — Inpatient Hospital Stay: Payer: Self-pay

## 2024-07-19 ENCOUNTER — Other Ambulatory Visit: Payer: Self-pay

## 2024-07-19 ENCOUNTER — Inpatient Hospital Stay

## 2024-07-19 ENCOUNTER — Ambulatory Visit: Payer: Self-pay

## 2024-07-19 VITALS — BP 129/66 | HR 73 | Temp 97.7°F | Resp 17 | Ht 71.0 in | Wt 279.0 lb

## 2024-07-19 DIAGNOSIS — D6481 Anemia due to antineoplastic chemotherapy: Secondary | ICD-10-CM

## 2024-07-19 DIAGNOSIS — T451X5A Adverse effect of antineoplastic and immunosuppressive drugs, initial encounter: Secondary | ICD-10-CM | POA: Diagnosis not present

## 2024-07-19 DIAGNOSIS — E785 Hyperlipidemia, unspecified: Secondary | ICD-10-CM

## 2024-07-19 DIAGNOSIS — E538 Deficiency of other specified B group vitamins: Secondary | ICD-10-CM

## 2024-07-19 DIAGNOSIS — D508 Other iron deficiency anemias: Secondary | ICD-10-CM | POA: Diagnosis not present

## 2024-07-19 DIAGNOSIS — C679 Malignant neoplasm of bladder, unspecified: Secondary | ICD-10-CM

## 2024-07-19 DIAGNOSIS — R7303 Prediabetes: Secondary | ICD-10-CM

## 2024-07-19 DIAGNOSIS — I2510 Atherosclerotic heart disease of native coronary artery without angina pectoris: Secondary | ICD-10-CM

## 2024-07-19 DIAGNOSIS — G629 Polyneuropathy, unspecified: Secondary | ICD-10-CM | POA: Diagnosis not present

## 2024-07-19 LAB — CMP (CANCER CENTER ONLY)
ALT: 15 U/L (ref 0–44)
AST: 20 U/L (ref 15–41)
Albumin: 4.7 g/dL (ref 3.5–5.0)
Alkaline Phosphatase: 45 U/L (ref 38–126)
Anion gap: 11 (ref 5–15)
BUN: 33 mg/dL — ABNORMAL HIGH (ref 8–23)
CO2: 21 mmol/L — ABNORMAL LOW (ref 22–32)
Calcium: 9.5 mg/dL (ref 8.9–10.3)
Chloride: 105 mmol/L (ref 98–111)
Creatinine: 1.1 mg/dL (ref 0.61–1.24)
GFR, Estimated: >60 mL/min
Glucose, Bld: 111 mg/dL — ABNORMAL HIGH (ref 70–99)
Potassium: 4.4 mmol/L (ref 3.5–5.1)
Sodium: 137 mmol/L (ref 135–145)
Total Bilirubin: 0.6 mg/dL (ref 0.0–1.2)
Total Protein: 7.5 g/dL (ref 6.5–8.1)

## 2024-07-19 LAB — CBC WITH DIFFERENTIAL (CANCER CENTER ONLY)
Abs Immature Granulocytes: 0.02 K/uL (ref 0.00–0.07)
Basophils Absolute: 0 K/uL (ref 0.0–0.1)
Basophils Relative: 1 %
Eosinophils Absolute: 0.1 K/uL (ref 0.0–0.5)
Eosinophils Relative: 2 %
HCT: 28.7 % — ABNORMAL LOW (ref 39.0–52.0)
Hemoglobin: 9.8 g/dL — ABNORMAL LOW (ref 13.0–17.0)
Immature Granulocytes: 0 %
Lymphocytes Relative: 28 %
Lymphs Abs: 1.4 K/uL (ref 0.7–4.0)
MCH: 32.3 pg (ref 26.0–34.0)
MCHC: 34.1 g/dL (ref 30.0–36.0)
MCV: 94.7 fL (ref 80.0–100.0)
Monocytes Absolute: 0.6 K/uL (ref 0.1–1.0)
Monocytes Relative: 12 %
Neutro Abs: 2.8 K/uL (ref 1.7–7.7)
Neutrophils Relative %: 57 %
Platelet Count: 177 K/uL (ref 150–400)
RBC: 3.03 MIL/uL — ABNORMAL LOW (ref 4.22–5.81)
RDW: 17.4 % — ABNORMAL HIGH (ref 11.5–15.5)
WBC Count: 5 K/uL (ref 4.0–10.5)
nRBC: 0 % (ref 0.0–0.2)

## 2024-07-19 LAB — IRON AND IRON BINDING CAPACITY (CC-WL,HP ONLY)
Iron: 93 ug/dL (ref 45–182)
Saturation Ratios: 20 % (ref 17.9–39.5)
TIBC: 468 ug/dL — ABNORMAL HIGH (ref 250–450)
UIBC: 375 ug/dL

## 2024-07-19 LAB — LIPID PANEL
Cholesterol: 214 mg/dL — ABNORMAL HIGH (ref 0–200)
HDL: 57 mg/dL
LDL Cholesterol: 137 mg/dL — ABNORMAL HIGH (ref 0–99)
Total CHOL/HDL Ratio: 3.7 ratio
Triglycerides: 99 mg/dL
VLDL: 20 mg/dL (ref 0–40)

## 2024-07-19 LAB — VITAMIN B12: Vitamin B-12: 1631 pg/mL — ABNORMAL HIGH (ref 180–914)

## 2024-07-19 LAB — FERRITIN: Ferritin: 512 ng/mL — ABNORMAL HIGH (ref 24–336)

## 2024-07-19 LAB — HEMOGLOBIN A1C
Hgb A1c MFr Bld: 5.9 % — ABNORMAL HIGH (ref 4.8–5.6)
Mean Plasma Glucose: 122.63 mg/dL

## 2024-07-19 LAB — FOLATE: Folate: 5.8 ng/mL — ABNORMAL LOW

## 2024-07-19 MED ORDER — FERROUS SULFATE 325 (65 FE) MG PO TBEC: 325.0000 mg | DELAYED_RELEASE_TABLET | Freq: Every day | ORAL | 0 refills | Status: AC

## 2024-07-19 MED ORDER — FOLIC ACID 1 MG PO TABS: 1.0000 mg | ORAL_TABLET | Freq: Every day | ORAL | 0 refills | Status: AC

## 2024-07-19 NOTE — Assessment & Plan Note (Addendum)
 Baseline right lower extremity worse than left.  Unchanged and stable.

## 2024-07-19 NOTE — Assessment & Plan Note (Addendum)
 Completed 4 ddMVAC with G-CSF support No evidence of disease on restaging imaging Will proceed with surgery as planned

## 2024-07-19 NOTE — Progress Notes (Signed)
 [facility] OFFICE PROGRESS NOTE  Patient Care Team: Peter Beverley NOVAK, MD as PCP - General (Family Medicine) Peter Wilbert SAUNDERS, MD as PCP - Cardiology (Cardiology)  Peter Oconnor is a 64 y.o.male with history of tobacco abuse, HTN, CAD, MI, hyperlipidemia and sleep apnea  [facility] bladder cancer.    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  Treatment: 04/18/24 -06/14/24 ddMVAC x 4 with GCSF support  Now completed neoadjuvant therapy.  Patient has no active disease on PET scan.  Plan to have surgery on 1/21.  Report of Peter Oconnor possibly in college after an infection.  We will obtain Signatera.  If negative perhaps we will make any adjuvant therapy if needed.  Patient will follow-up after surgery to discuss final pathology in late February.  He still has anemia.  Discussed today.  Recent Mohs surgery that he is recovering as well.  Iron saturation is borderline low.  Patient will start ferrous sulfate  325 mg nightly. Assessment & Plan Malignant neoplasm of urinary bladder, unspecified site Peter Oconnor) Completed 4 ddMVAC with G-CSF support No evidence of disease on restaging imaging Will proceed with surgery as planned Peripheral polyneuropathy Baseline right lower extremity worse than left.  Unchanged and stable. Anemia due to antineoplastic chemotherapy Completed chemotherapy  Will also check B12, folate, iron panel Other iron deficiency anemia Will take ferrous sulfate  325 mg nightly after finished doxycycline   Follow up with signatera  Follow up with port draw lab on Monday 2/23 at 330 and see me at 4  I personally spent a total of 41 minutes in the care of the patient today including preparing to see the patient, getting/reviewing separately obtained history, performing a medically appropriate exam/evaluation, counseling and educating,  placing orders, documenting clinical information in the EHR, communicating results, and coordinating care.   Orders Placed This Encounter  Procedures   Signatera    Select as applicable. If patient is on or planning to receive immunotherapy, select drug: Not on Immunotherapy If Other or Multiple, Write down drug name:  Do not Delete Below This Line   ==========Department Information========== ID: [id number] Department:[facility] - A DEPT OF [facility] [address] [place] [address] Dept: [phone] Dept Fax: [phone]    Standing Status:   Standing    Number of Occurrences:   4    Expiration Date:   07/19/2025    Peter Oconnor to follow up with patient for sample collection (mobile phleb, lab, or saliva)::   No    Surveillance Program draw frequency::   Every 3 Months    Surveillance Program draw count::   4    Cancer type::   Bladder    Stage of diagnosis::   II    Is the patient receiving or planning to receive immunotherapy?:   Yes    Patient status::   Outpatient    Most recent progress/clinical note attached?:   Yes    Pathology report attached?:   Yes    Tissue collection date (MM/DD/YYYY)::   03/07/2024    By placing this electronic order I confirm the testing ordered herein is medically necessary and this patient has been informed of the details of the genetic test(s) ordered, including the risks, benefits, and alternatives, and has consented to testing.:   Yes    What type of billing?:  Bill Insurance     Peter Oconnor Chihuahua, MD  INTERVAL HISTORY: Patient returns for follow-up.  He is here to discuss PET scan results and also evaluation post chemotherapy before his surgery.  Overall he is feeling well. No fever, coughing, nausea, stomach pain, diarrhea, constipation. Watery eyes for 5 days after C4. No blurred vision anymore. No hearing issue. No change in neuropathy.  PET scan showed no evidence of  disease.  Oncology History  Bladder cancer (HCC)  02/11/2024 Imaging   CT hematuria study 1. Contrast enhancing endoluminal mass of the right aspect of the urinary bladder measuring 5.4 x 2.9 cm with scattered associated calcifications and retraction of the bladder wall well as some adjacent fat stranding. 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  2. No evidence of lymphadenopathy or metastatic disease in the abdomen or pelvis. 3. Punctuate nonobstructive calculus of the midportion of the right kidney. No left-sided calculi, ureteral calculi, or hydronephrosis. 4. Hepatomegaly and hepatic steatosis. 5. Coronary artery disease.   03/07/2024 Pathology Results   Pathology A. BLADDER TUMOR, TURBT:  Invasive High-grade urothelial carcinoma with giant cells (20%), poorly  differentiated cells (10%) and squamous cell (30%) components.  The carcinoma invades muscularis propria (detrusor muscle)    03/17/2024 Initial Diagnosis   Bladder cancer (HCC)   04/18/2024 -  Chemotherapy   Patient is on Treatment Plan : BLADDER DOSE DENSE MVAC q14d     04/18/2024 - 06/14/2024 Chemotherapy   Patient is on Treatment Plan :  BLADDER DOSE DENSE MVAC q14d     07/11/2024 PET scan   PET: NED    07/19/2024 Cancer Staging   Staging form: Urinary Bladder, AJCC 8th Edition - Clinical: Stage II (cT2, cN0, cM0) - Signed by Oconnor Peter JAYSON, MD on 07/19/2024 WHO/ISUP grade (low/high): High Grade Histologic grading system: 2 grade system      PHYSICAL EXAMINATION: ECOG PERFORMANCE STATUS: 0  Vitals:   07/19/24 1125  BP: 129/66  Pulse: 73  Resp: 17  Temp: 97.7 F (36.5 C)  SpO2: 99%   Filed Weights   07/19/24 1125  Weight: 279 lb (126.6 kg)    GENERAL: alert, no distress and comfortable SKIN: skin color normal and no jaundice LUNGS:normal breathing effort   Relevant data reviewed during this visit included labs.  New labs ordered.

## 2024-07-19 NOTE — Anesthesia Preprocedure Evaluation (Addendum)
 Patient: Peter Oconnor  Procedure Information     Date/Time: 08/03/24 0700   Procedures:      CYSTECTOMY ILEOCONDUIT ROBOTIC XI     BIOPSY/EXCISION/DISSECTION PELVIC LYMPH NODE ROBOTIC XI   Location: [facility] OR [facility] / [facility] OR   Surgeons: Eva Natal, MD       Relevant Problems  CARDIOVASCULAR  (+) Coronary artery disease involving native coronary artery of native heart without angina pectoris    Ht 1.803 m (5' 11)   Wt 122 kg (270 lb)   BMI 37.66 kg/m    Clinical information reviewed:  Tobacco  Allergies  Meds  Med Hx  Surg Hx  Fam Hx  Soc Hx     64 y.o. male   Planned surgical procedure per surgeon notes: Cystectomy, ileal conduit robotic, biopsy/excision/dissection pelvic lymph node  Reason for planned procedure: Bladder cancer  PMH   Patient Active Problem List   Diagnosis Date Noted   Malignant neoplasm of overlapping sites of bladder    (CMD) 03/16/2024   Coronary artery disease involving native coronary artery of native heart without angina pectoris 01/10/2020    PSH   Surgical History[1]  PREVIOUS ANESTHESIA COMMENTS/NOTES      PREOPERATIVE RISK ASSESSMENTS  Would you have difficulty climbing a full flight of stairs or walking up a hill?: No  Have you been to the ER, Urgent Care, or Doctor's office for a new problem in the past 1-2 months?: Yes (mohs sx)  MICA Risk Score: 0% DASI Total Score: 44.95  DASI METs: 8.27    Frailty Assessment Clinical Frailty Scale: 3     MEDICATIONS AND ALLERGIES  He is allergic to penicillins.    His home medications include aspirin , coenzyme Q-10, lisinopriL , and rosuvastatin     ADDITIONAL PROVIDER DOCUMENTATION/RESULTS (EPIC)  Urologic oncology evaluation 03/22/2024- Malignant neoplasm of overlapping sites of bladder     OUTSIDE PROVIDER DOCUMENTATION/RESULTS (MEDIA/CARE EVERYWHERE)     RESULTS  Outside labs 06/14/2024-hemoglobin 10.9, platelet count 175, potassium 4.3, creatinine 0.96  Echo  03/28/2024-EF 65%, Pericardium: There is no evidence of pericardial effusion.   Mitral Valve: The mitral valve is normal in structure. Trivial mitral valve regurgitation. No evidence of mitral valve stenosis.   Tricuspid Valve: The tricuspid valve is not well visualized. Tricuspid valve regurgitation is trivial. No evidence of tricuspid stenosis.   Aortic Valve: Aortic valve appears functionally bicuspid, with fusion of LCC-NCC by calcification. The aortic valve has an indeterminant number of cusps. There is moderate calcification of the aortic valve. There is moderate thickening of the aortic  valve. Aortic valve regurgitation is not visualized. Mild aortic stenosis is present. Aortic valve mean gradient measures 13.5 mmHg. Aortic valve peak gradient measures 23.6 mmHg. Aortic valve area, by VTI measures 1.84 cm.   Pulmonic Valve: The pulmonic valve was not well visualized. Pulmonic valve regurgitation is trivial. No evidence of pulmonic stenosis.   Aorta: The aortic root, ascending aorta and aortic arch are all structurally normal, with no evidence of dilitation or obstruction.   Venous: The inferior vena cava was not well visualized.   IAS/Shunts: The atrial septum is grossly normal.   Additional Comments: 3D was performed not requiring image post processing on an independent workstation and was normal.     Electronically signed by: PRENTICE VEAR GANDER, PA-C 07/19/2024 10:03 AM   Anesthesia Evaluation  PONV Predictive Score (Scale 0-5):  Apfel risk score: 0 Respiratory: Patient has sleep apnea.  Cardiovascular: Patient has high blood pressure and CAD.  HEENT: negative HEENT ROS.  Neurological: negative neuro ROS.  Musculoskeletal: negative musculoskeletal ROS.  Infectious Disease: negative infectious disease ROS.  Renal/Gastrointestinal: negative GI/hepatic ROS.  Genitourinary: negative renal ROS.  Endocrine: negative endocrine ROS.  Hematology/Oncology: negative  hematology/oncology ROS.     Physical Exam  Airway  Mallampati: II TM Distance (FB): 3 Oral Aperture (FB): 3 Neck ROM: full Cardiovascular - normal exam Dental - normal exam Pulmonary - normal exam Abdominal - normal exam   Anesthesia Plan  Review Preop documentation reviewed: H&P reviewed, Surgeon's Note Reviewed, Blood Availability Reviewed, NPO Status Reviewed, Preop Vitals Reviewed, Previous Anesthesia Records Reviewed and Periop Tests and Results Reviewed  Plan ASA score: 3  Anesthesia type: general Induction: intravenous No trial extubation  Informed Consent Anesthetic plan and risks discussed with patient. Use of blood products discussed with patient whom. Plan discussed with CRNA and attending.           [1] Past Surgical History: Procedure Laterality Date   CARDIAC CATHETERIZATION     Procedure: CARDIAC CATHETERIZATION   COLONOSCOPY     CORONARY ANGIOPLASTY     Procedure: CORONARY ANGIOPLASTY   CYSTOSCOPY  August 2025   ORIF ANKLE FRACTURE  March 2023   VASECTOMY  2007

## 2024-07-19 NOTE — Assessment & Plan Note (Addendum)
 Completed chemotherapy  Will also check B12, folate, iron panel

## 2024-07-20 ENCOUNTER — Encounter: Payer: Self-pay | Admitting: Family Medicine

## 2024-07-20 ENCOUNTER — Other Ambulatory Visit: Payer: Self-pay

## 2024-07-20 ENCOUNTER — Ambulatory Visit: Payer: Self-pay | Admitting: Family Medicine

## 2024-07-20 DIAGNOSIS — E785 Hyperlipidemia, unspecified: Secondary | ICD-10-CM

## 2024-07-20 MED ORDER — EZETIMIBE 10 MG PO TABS: 10.0000 mg | ORAL_TABLET | Freq: Every day | ORAL | 3 refills | Status: AC

## 2024-07-21 ENCOUNTER — Telehealth: Payer: Self-pay | Admitting: Family Medicine

## 2024-07-21 NOTE — Telephone Encounter (Signed)
 Copied from CRM #[id number]. Topic: General - Other >> Jul 21, 2024  8:30 AM Robinson H wrote: Reason for CRM: Patient returning call to Ray at clinic, please reach back out.  Melchor [phone]

## 2024-07-21 NOTE — Telephone Encounter (Signed)
 Called patient, patient expressed understanding

## 2024-07-21 NOTE — Telephone Encounter (Signed)
 This concerns a Result f/up for a lipid panel from yesterday, I can't add to that message.

## 2024-07-22 ENCOUNTER — Telehealth (HOSPITAL_BASED_OUTPATIENT_CLINIC_OR_DEPARTMENT_OTHER): Payer: Self-pay | Admitting: *Deleted

## 2024-07-22 ENCOUNTER — Telehealth (HOSPITAL_BASED_OUTPATIENT_CLINIC_OR_DEPARTMENT_OTHER): Payer: Self-pay

## 2024-07-22 NOTE — Telephone Encounter (Signed)
"  ° °  Pre-operative Risk Assessment    Patient Name: Peter Oconnor  DOB: Sep 18, 1960 MRN: [id number]   Date of last office visit: 03/04/24 with Dr. Lavona  Date of next office visit: NA  Request for Surgical Clearance    Procedure:  Cystectomy   Date of Surgery:  Clearance 08/03/24                                  Surgeon:  Dr. Eva Natal  Surgeon's Group or Practice Name:  [facility]  Phone number:  [phone] Fax number:  [phone]   Type of Clearance Requested:   - Medical  - Pharmacy:  Hold Aspirin  not indicated    Type of Anesthesia:  Not Indicated   Additional requests/questions:    Bonney Augustin JONETTA Delores   07/22/2024, 8:55 AM   "

## 2024-07-22 NOTE — Telephone Encounter (Signed)
 Pt has been scheduled tele preop appt 07/26/24.med rec and consent are done.

## 2024-07-22 NOTE — Telephone Encounter (Signed)
" ° °  Name: Armando Bukhari  DOB: [birth year]  MRN: [id number]  Primary Cardiologist: Wilbert Bihari, MD   Preoperative team, please contact this patient and set up a phone call appointment for further preoperative risk assessment. Please obtain consent and complete medication review. Thank you for your help.  I confirm that guidance regarding antiplatelet and oral anticoagulation therapy has been completed and, if necessary, noted below.  Regarding ASA therapy, we recommend continuation of ASA throughout the perioperative period.  However, if the surgeon feels that cessation of ASA is required in the perioperative period, it may be stopped 5-7 days prior to surgery with a plan to resume it as soon as felt to be feasible from a surgical standpoint in the post-operative period.  I also confirmed the patient resides in the state of [place] . As per [facility] Medical Board telemedicine laws, the patient must reside in the state in which the provider is licensed.   Damien JAYSON Braver, NP 07/22/2024, 10:32 AM [facility]    "

## 2024-07-22 NOTE — Telephone Encounter (Signed)
 Pt has been scheduled tele preop appt 07/26/24.med rec and consent are done.      Patient Consent for Virtual Visit        Peter Oconnor has provided verbal consent on 07/22/2024 for a virtual visit (video or telephone).   CONSENT FOR VIRTUAL VISIT FOR:  Peter Oconnor  By participating in this virtual visit I agree to the following:  I hereby voluntarily request, consent and authorize [facility] and its employed or contracted physicians, physician assistants, nurse practitioners or other licensed health care professionals (the Practitioner), to provide me with telemedicine health care services (the Services) as deemed necessary by the treating Practitioner. I acknowledge and consent to receive the Services by the Practitioner via telemedicine. I understand that the telemedicine visit will involve communicating with the Practitioner through live audiovisual communication technology and the disclosure of certain medical information by electronic transmission. I acknowledge that I have been given the opportunity to request an in-person assessment or other available alternative prior to the telemedicine visit and am voluntarily participating in the telemedicine visit.  I understand that I have the right to withhold or withdraw my consent to the use of telemedicine in the course of my care at any time, without affecting my right to future care or treatment, and that the Practitioner or I may terminate the telemedicine visit at any time. I understand that I have the right to inspect all information obtained and/or recorded in the course of the telemedicine visit and may receive copies of available information for a reasonable fee.  I understand that some of the potential risks of receiving the Services via telemedicine include:  Delay or interruption in medical evaluation due to technological equipment failure or disruption; Information transmitted may not be sufficient (e.g. poor resolution  of images) to allow for appropriate medical decision making by the Practitioner; and/or  In rare instances, security protocols could fail, causing a breach of personal health information.  Furthermore, I acknowledge that it is my responsibility to provide information about my medical history, conditions and care that is complete and accurate to the best of my ability. I acknowledge that Practitioner's advice, recommendations, and/or decision may be based on factors not within their control, such as incomplete or inaccurate data provided by me or distortions of diagnostic images or specimens that may result from electronic transmissions. I understand that the practice of medicine is not an exact science and that Practitioner makes no warranties or guarantees regarding treatment outcomes. I acknowledge that a copy of this consent can be made available to me via my patient portal [facility] MyChart), or I can request a printed copy by calling the office of [facility].    I understand that my insurance will be billed for this visit.   I have read or had this consent read to me. I understand the contents of this consent, which adequately explains the benefits and risks of the Services being provided via telemedicine.  I have been provided ample opportunity to ask questions regarding this consent and the Services and have had my questions answered to my satisfaction. I give my informed consent for the services to be provided through the use of telemedicine in my medical care

## 2024-07-25 ENCOUNTER — Other Ambulatory Visit: Payer: Self-pay

## 2024-07-25 NOTE — Telephone Encounter (Signed)
 This was faxed

## 2024-07-26 ENCOUNTER — Ambulatory Visit: Attending: Cardiology

## 2024-07-26 DIAGNOSIS — Z01810 Encounter for preprocedural cardiovascular examination: Secondary | ICD-10-CM | POA: Diagnosis not present

## 2024-07-26 NOTE — Progress Notes (Signed)
 "   Virtual Visit via Telephone Note   Because of Peter Oconnor co-morbid illnesses, he is at least at moderate risk for complications without adequate follow up.  This format is felt to be most appropriate for this patient at this time.  Due to technical limitations with video connection (technology), today's appointment will be conducted as an audio only telehealth visit, and Peter Oconnor verbally agreed to proceed in this manner.   All issues noted in this document were discussed and addressed.  No physical exam could be performed with this format.  Evaluation Performed:  Preoperative cardiovascular risk assessment _____________   Date:  07/26/2024   Patient ID:  Peter Oconnor, DOB [birth year], MRN [id number] Patient Location:  Home Provider location:   Office  Primary Care Provider:  Sebastian Beverley NOVAK, MD Primary Cardiologist:  Wilbert Bihari, MD  Chief Complaint / Patient Profile   64 y.o. y/o male with a h/o CAD s/p MI in 01/2014 with stenting to LAD and LCx in [place] , hypertension, hyperlipidemia, OSA who is pending cystectomy on 08/03/24 and presents today for telephonic preoperative cardiovascular risk assessment.  History of Present Illness    Peter Oconnor is a 64 y.o. male who presents via audio/video conferencing for a telehealth visit today.  Pt was last seen in cardiology clinic on 03/04/24 by Dr. Lavona.  At that time Peter Oconnor was doing well. He had been hiking without concerning cardiac symptoms. He was cleared for a different bladder procedure. A murmur was noted on exam, for which an echocardiogram was ordered and showed mild aortic stenosis. The patient is now pending procedure as outlined above. Since his last visit, he has unfortunately been diagnosed with bladder cancer and has undergone treatment for that. Chemotherapy has slowed him down from an activity standpoint, but he is still able to walk around his house and up a flight of stairs multiple times per day as  well as vacuum occasionally without concerning cardiac symptoms. He denies chest pain, shortness of breath, orthopnea, PND, lower extremity edema, palpitations, lightheadedness, dizziness, syncope.   Past Medical History    Past Medical History:  Diagnosis Date   Allergy 1983   Penicillin   Arthritis    Basal cell carcinoma of skin 06/23/2024   Right Nasal Ala- NEEDS MOHS   Bladder cancer [facility]) July 2025   CAD (coronary artery disease), native coronary artery 01/25/2014   S/p anterior MI with 90% LAD and occluded LCx s/p PCI of LAD and LCx with residual 30-40% stenosis of the RCA and normal LVF with lateral AK.     Cancer [facility])    Bladder   CHF (congestive heart failure) (HCC)    Patient denies   Chicken pox    Heart murmur August 2025   Test scheduled Nov 2025   Hyperlipidemia with target LDL less than 70 10/19/2015   Hypertension    MI, old    Obesity (BMI 30-39.9) 10/19/2015   OSA (obstructive sleep apnea) 10/19/2015   Pre-diabetes    Sleep apnea    Past Surgical History:  Procedure Laterality Date   BLADDER INSTILLATION N/A 03/07/2024   Procedure: INSTILLATION, BLADDER;  Surgeon: Matilda Senior, MD;  Location: [facility];  Service: Urology;  Laterality: N/A;   CARDIAC CATHETERIZATION  2015   LHC w/ BMS x2 LCx and LAD  done in [place]    COLONOSCOPY     x1   EYE SURGERY Right 2014   Laser repair   done at [facility]   IR  IMAGING GUIDED PORT INSERTION  04/04/2024   KNEE SURGERY Right 2000   torn Meniscus repair   done in [place]    NASAL SINUS SURGERY     ORIF ANKLE FRACTURE Right 09/20/2021   Procedure: OPEN REDUCTION INTERNAL FIXATION (ORIF) ANKLE FRACTURE;  Surgeon: Shari Sieving, MD;  Location: [facility];  Service: Orthopedics;  Laterality: Right;   VASECTOMY  2007    Allergies  Allergies[1]  Home Medications    Prior to Admission medications  Medication Sig Start Date End Date Taking? Authorizing Provider  aspirin  EC 81 MG tablet Take 81 mg by mouth daily.  Swallow whole.    [provider]  Collagen-Vitamin C-Biotin (COLLAGEN PO) Take 2 Doses by mouth daily. Patient not taking: Reported on 07/22/2024    [provider]  dexamethasone  (DECADRON ) 4 MG tablet Take 2 tablets (8 mg) by mouth daily x 3 days starting the day after cisplatin  chemotherapy. Take with food. Patient not taking: Reported on 07/22/2024 03/28/24   Tina Pauletta BROCKS, MD  ezetimibe  (ZETIA ) 10 MG tablet Take 1 tablet (10 mg total) by mouth daily. 07/20/24   Sebastian Beverley NOVAK, MD  ferrous sulfate  325 (65 FE) MG EC tablet Take 1 tablet (325 mg total) by mouth at bedtime. 07/19/24   Tina Pauletta BROCKS, MD  folic acid  (FOLVITE ) 1 MG tablet Take 1 tablet (1 mg total) by mouth daily. 07/19/24   Tina Pauletta BROCKS, MD  lidocaine -prilocaine  (EMLA ) cream Apply to affected area once Patient not taking: Reported on 07/22/2024 03/28/24   Tina Pauletta BROCKS, MD  lisinopril  (ZESTRIL ) 5 MG tablet Take 1 tablet (5 mg total) by mouth daily. 11/09/23   Shlomo Wilbert SAUNDERS, MD  mupirocin  ointment (BACTROBAN ) 2 % Apply 1 Application topically 2 (two) times daily. 07/06/24   Paci, Karina M, MD  ondansetron  (ZOFRAN ) 8 MG tablet Take 1 tablet (8 mg total) by mouth every 8 (eight) hours as needed for nausea or vomiting. Start on the third day after cisplatin . Patient not taking: Reported on 07/22/2024 03/28/24   Tina Pauletta BROCKS, MD  oxyCODONE  (OXY IR/ROXICODONE ) 5 MG immediate release tablet Take 1 tablet (5 mg total) by mouth every 6 (six) hours as needed for up to 8 doses. Patient not taking: Reported on 07/22/2024 07/06/24   Paci, Karina M, MD  oxyCODONE  (ROXICODONE ) 5 MG immediate release tablet Take 1 tablet (5 mg total) by mouth every 4 (four) hours as needed for severe pain (pain score 7-10). Patient not taking: Reported on 07/22/2024 03/07/24   Matilda Senior, MD  prochlorperazine  (COMPAZINE ) 10 MG tablet Take 1 tablet (10 mg total) by mouth every 6 (six) hours as needed (Nausea or vomiting). Patient not taking:  Reported on 07/22/2024 05/16/24   Tina Pauletta BROCKS, MD  rosuvastatin  (CRESTOR ) 40 MG tablet TAKE 1 TABLET BY MOUTH DAILY 11/12/23   Shlomo Wilbert SAUNDERS, MD    Physical Exam    Vital Signs:  Peter Oconnor does not have vital signs available for review today.  Given telephonic nature of communication, physical exam is limited. AAOx3. NAD. Normal affect.  Speech and respirations are unlabored.  Accessory Clinical Findings    None  Assessment & Plan    1. Preoperative Cardiovascular Risk Assessment: According to the Revised Cardiac Risk Index (RCRI), his Perioperative Risk of Major Cardiac Event is (%): 6.6 His Functional Capacity in METs is: 5.81 according to the Duke Activity Status Index (DASI). The patient was advised that if he develops new symptoms prior to surgery  to contact our office to arrange for a follow-up visit, and he verbalized understanding. Per AHA/ACC guidelines, he is deemed acceptable risk for the planned procedure without additional cardiovascular testing.   Per Dr. Shlomo, regarding ASA therapy, we recommend continuation of ASA throughout the perioperative period.  However, if the surgeon feels that cessation of ASA is required in the perioperative period, it may be stopped 5-7 days prior to surgery with a plan to resume it as soon as felt to be feasible from a surgical standpoint in the post-operative period.  A copy of this note will be routed to requesting surgeon.  Time:   Today, I have spent 10 minutes with the patient with telehealth technology discussing medical history, symptoms, and management plan.     Saddie GORMAN Cleaves, NP 07/26/2024, 2:27 PM    [1]  Allergies Allergen Reactions   Sglt2 Inhibitors     History of Bladder CAncer   Penicillins Itching and Rash   "

## 2024-07-27 ENCOUNTER — Encounter: Admitting: Dermatology

## 2024-07-27 ENCOUNTER — Inpatient Hospital Stay

## 2024-07-27 DIAGNOSIS — C679 Malignant neoplasm of bladder, unspecified: Secondary | ICD-10-CM

## 2024-07-27 LAB — GENETIC SCREENING ORDER

## 2024-08-01 ENCOUNTER — Encounter: Payer: Self-pay | Admitting: Dermatology

## 2024-08-01 ENCOUNTER — Ambulatory Visit: Admitting: Dermatology

## 2024-08-01 DIAGNOSIS — L570 Actinic keratosis: Secondary | ICD-10-CM

## 2024-08-01 DIAGNOSIS — Z85828 Personal history of other malignant neoplasm of skin: Secondary | ICD-10-CM

## 2024-08-01 DIAGNOSIS — L905 Scar conditions and fibrosis of skin: Secondary | ICD-10-CM | POA: Diagnosis not present

## 2024-08-01 DIAGNOSIS — W908XXA Exposure to other nonionizing radiation, initial encounter: Secondary | ICD-10-CM | POA: Diagnosis not present

## 2024-08-01 DIAGNOSIS — C4491 Basal cell carcinoma of skin, unspecified: Secondary | ICD-10-CM

## 2024-08-01 NOTE — Patient Instructions (Addendum)

## 2024-08-01 NOTE — Progress Notes (Signed)
" ° °  Follow Up Visit   Subjective  Peter Oconnor is a 64 y.o. 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 The patient has been bandaging the wound as directed. The endorse the following concerns: Numb to touch with occasional itchiness  The following portions of the chart were reviewed this encounter and updated as appropriate: medications, allergies, medical history  Review of Systems:  No other skin or systemic complaints except as noted in HPI or Assessment and Plan.  Objective  Well appearing patient in no apparent distress; mood and affect are within normal limits.  A focal examination was performed including  face and  All findings within normal limits unless otherwise noted below.  Healing wound with mild erythema  Relevant physical exam findings are noted in the Assessment and Plan.          Assessment & Plan    Scar s/p Mohs for Nodular Basal Cell Carcinoma of the right ala/ nasolabial fold , treated on 07/06/24, repaired with an advancement flap - Reassured that wound is healing well - Discussed that scars take up to 12 months to mature from the date of surgery - Recommend SPF 30+ to scar daily to prevent purple color from UV exposure during scar maturation process - Discussed that erythema and raised appearance of scar will fade over the next 4-6 months - OK to start scar massage at 4-6 weeks post-op - Can consider silicone based products for scar healing starting at 6 weeks post-op  HISTORY OF BASAL CELL CARCINOMA OF THE SKIN - No evidence of recurrence today - Recommend regular full body skin exams   - Recommend daily broad spectrum sunscreen SPF 30+ to  sun-exposed areas, reapply every 2 hours as needed.  - Call if any new or changing lesions are noted between office  visits  ACTINIC KERATOSIS Exam: Erythematous thin papules/macules with gritty scale at the scalp  Actinic keratoses are precancerous spots that appear secondary to cumulative UV radiation exposure/sun exposure over time. They are chronic with expected duration over 1 year. A portion of actinic keratoses will progress to squamous cell carcinoma of the skin. It is not possible to reliably predict which spots will progress to skin cancer and so treatment is recommended to prevent development of skin cancer.  Recommend daily broad spectrum sunscreen SPF 30+ to sun-exposed areas, reapply every 2 hours as needed.  Recommend staying in the shade or wearing long sleeves, sun glasses (UVA+UVB protection) and wide brim hats (4-inch brim around the entire circumference of the hat). Call for new or changing lesions.  Treatment Plan: Will wait until after bladder surgery to discuss further treatment     Return in about 2 months (around 09/29/2024) for Wound Check right ala/ nasolabial fold.  LILLETTE Virgle Boards, Mohs/Dermatology Tech am acting as a neurosurgeon for RUFUS CHRISTELLA HOLY, MD.   Documentation: I have reviewed the above documentation for accuracy and completeness, and I agree with the above.  RUFUS CHRISTELLA HOLY, MD  "

## 2024-08-02 ENCOUNTER — Other Ambulatory Visit: Payer: Self-pay

## 2024-08-09 ENCOUNTER — Other Ambulatory Visit: Payer: Self-pay

## 2024-08-09 DIAGNOSIS — C679 Malignant neoplasm of bladder, unspecified: Secondary | ICD-10-CM

## 2024-08-09 NOTE — Progress Notes (Signed)
 Labs ordered to be taken with Signatera in his next lab visit.

## 2024-08-10 ENCOUNTER — Telehealth: Payer: Self-pay

## 2024-08-10 NOTE — Telephone Encounter (Signed)
 Moved patients appts to mid march per patient request. Called the patient to go over the days and times. He is aware.

## 2024-08-11 ENCOUNTER — Other Ambulatory Visit: Payer: Self-pay

## 2024-08-18 ENCOUNTER — Ambulatory Visit: Payer: Self-pay

## 2024-08-18 LAB — SIGNATERA
SIGNATERA MTM READOUT: 0 MTM/ml
SIGNATERA TEST RESULT: NEGATIVE

## 2024-08-19 ENCOUNTER — Encounter (HOSPITAL_COMMUNITY): Payer: Self-pay

## 2024-08-19 NOTE — Telephone Encounter (Signed)
 Notified of message below

## 2024-08-19 NOTE — Telephone Encounter (Signed)
-----   Message from Pauletta Chihuahua, MD sent at 08/18/2024 11:23 PM EST ----- Alvina Strother please let him know given the excellent news of negative Signatera for tumor DNA and surgical outcome on pathology, he may postpone lab (signatera, CBC, CMP, mg) to mid April and follow up with  me a week after blood draw. Thanks.

## 2024-09-05 ENCOUNTER — Inpatient Hospital Stay

## 2024-09-20 ENCOUNTER — Inpatient Hospital Stay

## 2024-09-26 ENCOUNTER — Ambulatory Visit: Admitting: Dermatology

## 2024-09-27 ENCOUNTER — Inpatient Hospital Stay
